# Patient Record
Sex: Female | Born: 1935
Health system: Southern US, Community
[De-identification: ages and names within clinical notes are randomized; demographics above are authoritative.]

## PROBLEM LIST (undated history)

## (undated) DIAGNOSIS — Q632 Ectopic kidney: Secondary | ICD-10-CM

## (undated) DIAGNOSIS — D801 Nonfamilial hypogammaglobulinemia: Secondary | ICD-10-CM

## (undated) DIAGNOSIS — I341 Nonrheumatic mitral (valve) prolapse: Secondary | ICD-10-CM

## (undated) DIAGNOSIS — D510 Vitamin B12 deficiency anemia due to intrinsic factor deficiency: Secondary | ICD-10-CM

## (undated) DIAGNOSIS — M5430 Sciatica, unspecified side: Secondary | ICD-10-CM

## (undated) DIAGNOSIS — F40240 Claustrophobia: Secondary | ICD-10-CM

## (undated) DIAGNOSIS — M1990 Unspecified osteoarthritis, unspecified site: Secondary | ICD-10-CM

## (undated) DIAGNOSIS — E785 Hyperlipidemia, unspecified: Secondary | ICD-10-CM

## (undated) DIAGNOSIS — G459 Transient cerebral ischemic attack, unspecified: Secondary | ICD-10-CM

## (undated) DIAGNOSIS — K5730 Diverticulosis of large intestine without perforation or abscess without bleeding: Secondary | ICD-10-CM

## (undated) DIAGNOSIS — C4491 Basal cell carcinoma of skin, unspecified: Secondary | ICD-10-CM

## (undated) DIAGNOSIS — I209 Angina pectoris, unspecified: Secondary | ICD-10-CM

## (undated) DIAGNOSIS — I639 Cerebral infarction, unspecified: Secondary | ICD-10-CM

## (undated) DIAGNOSIS — K449 Diaphragmatic hernia without obstruction or gangrene: Secondary | ICD-10-CM

## (undated) DIAGNOSIS — I10 Essential (primary) hypertension: Secondary | ICD-10-CM

## (undated) HISTORY — PX: APPENDECTOMY: SHX54

## (undated) HISTORY — PX: BASAL CELL CARCINOMA EXCISION: SHX1214

## (undated) HISTORY — DX: Cerebral infarction, unspecified: I63.9

## (undated) HISTORY — PX: CHOLECYSTECTOMY OPEN: SUR202

## (undated) HISTORY — PX: INGUINAL HERNIA REPAIR: SUR1180

## (undated) HISTORY — PX: DILATION AND CURETTAGE OF UTERUS: SHX78

## (undated) HISTORY — PX: OTHER SURGICAL HISTORY: SHX169

## (undated) HISTORY — DX: Ectopic kidney: Q63.2

## (undated) HISTORY — PX: BREAST BIOPSY: SHX20

## (undated) HISTORY — DX: Transient cerebral ischemic attack, unspecified: G45.9

## (undated) HISTORY — PX: BREAST LUMPECTOMY: SHX2

## (undated) HISTORY — DX: Essential (primary) hypertension: I10

## (undated) HISTORY — DX: Basal cell carcinoma of skin, unspecified: C44.91

## (undated) HISTORY — DX: Angina pectoris, unspecified: I20.9

## (undated) HISTORY — DX: Unspecified osteoarthritis, unspecified site: M19.90

## (undated) HISTORY — DX: Claustrophobia: F40.240

## (undated) HISTORY — PX: TOTAL ABDOMINAL HYSTERECTOMY: SHX209

## (undated) HISTORY — DX: Diverticulosis of large intestine without perforation or abscess without bleeding: K57.30

## (undated) HISTORY — DX: Vitamin B12 deficiency anemia due to intrinsic factor deficiency: D51.0

## (undated) HISTORY — PX: CATARACT EXTRACTION W/ INTRAOCULAR LENS  IMPLANT, BILATERAL: SHX1307

## (undated) HISTORY — DX: Sciatica, unspecified side: M54.30

## (undated) HISTORY — DX: Nonfamilial hypogammaglobulinemia: D80.1

## (undated) HISTORY — DX: Diaphragmatic hernia without obstruction or gangrene: K44.9

## (undated) HISTORY — PX: TONSILLECTOMY: SUR1361

## (undated) HISTORY — DX: Nonrheumatic mitral (valve) prolapse: I34.1

## (undated) HISTORY — DX: Hyperlipidemia, unspecified: E78.5

---

## 2005-01-09 ENCOUNTER — Encounter (INDEPENDENT_AMBULATORY_CARE_PROVIDER_SITE_OTHER): Payer: Self-pay | Admitting: Specialist

## 2005-01-09 ENCOUNTER — Inpatient Hospital Stay (HOSPITAL_COMMUNITY): Admission: RE | Admit: 2005-01-09 | Discharge: 2005-01-12 | Payer: Self-pay | Admitting: Gynecology

## 2005-05-10 ENCOUNTER — Encounter: Payer: Self-pay | Admitting: Internal Medicine

## 2006-08-06 ENCOUNTER — Encounter: Payer: Self-pay | Admitting: Internal Medicine

## 2007-03-19 ENCOUNTER — Emergency Department: Payer: Self-pay | Admitting: Emergency Medicine

## 2007-09-06 ENCOUNTER — Emergency Department: Payer: Self-pay | Admitting: Emergency Medicine

## 2008-01-07 ENCOUNTER — Emergency Department: Payer: Self-pay | Admitting: Emergency Medicine

## 2008-11-02 ENCOUNTER — Encounter: Payer: Self-pay | Admitting: Internal Medicine

## 2008-11-02 ENCOUNTER — Ambulatory Visit: Payer: Self-pay | Admitting: Gastroenterology

## 2009-06-17 ENCOUNTER — Encounter: Payer: Self-pay | Admitting: Internal Medicine

## 2009-08-31 HISTORY — PX: KNEE ARTHROSCOPY W/ PARTIAL MEDIAL MENISCECTOMY: SHX1882

## 2009-09-03 ENCOUNTER — Encounter: Admission: RE | Admit: 2009-09-03 | Discharge: 2009-09-03 | Payer: Self-pay | Admitting: Orthopedic Surgery

## 2009-09-15 ENCOUNTER — Ambulatory Visit: Payer: Self-pay | Admitting: Orthopedic Surgery

## 2009-09-20 ENCOUNTER — Ambulatory Visit: Payer: Self-pay | Admitting: Orthopedic Surgery

## 2010-02-09 ENCOUNTER — Ambulatory Visit: Payer: Self-pay | Admitting: Cardiovascular Disease

## 2010-02-09 DIAGNOSIS — F39 Unspecified mood [affective] disorder: Secondary | ICD-10-CM | POA: Insufficient documentation

## 2010-02-09 DIAGNOSIS — I059 Rheumatic mitral valve disease, unspecified: Secondary | ICD-10-CM

## 2010-02-09 DIAGNOSIS — I10 Essential (primary) hypertension: Secondary | ICD-10-CM

## 2010-02-09 DIAGNOSIS — R079 Chest pain, unspecified: Secondary | ICD-10-CM

## 2010-02-09 DIAGNOSIS — Q401 Congenital hiatus hernia: Secondary | ICD-10-CM | POA: Insufficient documentation

## 2010-02-16 ENCOUNTER — Encounter: Payer: Self-pay | Admitting: Internal Medicine

## 2010-11-02 ENCOUNTER — Encounter: Payer: Self-pay | Admitting: Internal Medicine

## 2010-11-02 DIAGNOSIS — D510 Vitamin B12 deficiency anemia due to intrinsic factor deficiency: Secondary | ICD-10-CM | POA: Insufficient documentation

## 2010-11-02 DIAGNOSIS — E785 Hyperlipidemia, unspecified: Secondary | ICD-10-CM

## 2010-11-07 ENCOUNTER — Ambulatory Visit: Payer: Self-pay | Admitting: Internal Medicine

## 2010-11-07 ENCOUNTER — Telehealth: Payer: Self-pay | Admitting: Internal Medicine

## 2010-11-07 DIAGNOSIS — M159 Polyosteoarthritis, unspecified: Secondary | ICD-10-CM

## 2010-11-07 DIAGNOSIS — R197 Diarrhea, unspecified: Secondary | ICD-10-CM | POA: Insufficient documentation

## 2010-11-10 LAB — CONVERTED CEMR LAB
ALT: 27 units/L (ref 0–35)
AST: 27 units/L (ref 0–37)
Albumin: 4.1 g/dL (ref 3.5–5.2)
Basophils Relative: 0.5 % (ref 0.0–3.0)
Bilirubin, Direct: 0.2 mg/dL (ref 0.0–0.3)
Calcium: 9.7 mg/dL (ref 8.4–10.5)
Eosinophils Absolute: 0 10*3/uL (ref 0.0–0.7)
Glucose, Bld: 84 mg/dL (ref 70–99)
Lymphs Abs: 1.1 10*3/uL (ref 0.7–4.0)
MCHC: 34 g/dL (ref 30.0–36.0)
MCV: 96.2 fL (ref 78.0–100.0)
Monocytes Absolute: 0.7 10*3/uL (ref 0.1–1.0)
Neutro Abs: 4.7 10*3/uL (ref 1.4–7.7)
Neutrophils Relative %: 71.1 % (ref 43.0–77.0)
Potassium: 3.3 meq/L — ABNORMAL LOW (ref 3.5–5.1)
RBC: 4.11 M/uL (ref 3.87–5.11)

## 2010-11-21 ENCOUNTER — Ambulatory Visit: Payer: Self-pay | Admitting: Internal Medicine

## 2010-11-21 DIAGNOSIS — E876 Hypokalemia: Secondary | ICD-10-CM

## 2010-11-21 DIAGNOSIS — J019 Acute sinusitis, unspecified: Secondary | ICD-10-CM

## 2011-01-02 ENCOUNTER — Encounter: Payer: Self-pay | Admitting: Internal Medicine

## 2011-01-02 ENCOUNTER — Ambulatory Visit: Admission: RE | Admit: 2011-01-02 | Payer: Self-pay | Source: Home / Self Care | Admitting: Internal Medicine

## 2011-01-02 ENCOUNTER — Ambulatory Visit
Admission: RE | Admit: 2011-01-02 | Discharge: 2011-01-02 | Payer: Self-pay | Source: Home / Self Care | Attending: Family Medicine | Admitting: Family Medicine

## 2011-01-02 LAB — CONVERTED CEMR LAB
Ketones, urine, test strip: NEGATIVE
Nitrite: NEGATIVE
Urobilinogen, UA: 0.2

## 2011-01-03 ENCOUNTER — Encounter: Payer: Self-pay | Admitting: Family Medicine

## 2011-01-30 NOTE — Assessment & Plan Note (Signed)
Summary: NEW PT TO ESTABH MEDICARE   Vital Signs:  Patient profile:   77 year old female Height:      61.5 inches Weight:      153 pounds BMI:     28.54 Temp:     98.3 degrees F oral Pulse rate:   72 / minute Pulse rhythm:   regular BP sitting:   140 / 70  (left arm) Cuff size:   regular  Vitals Entered By: Mervin Hack CMA Duncan Dull) (November 07, 2010 12:06 PM) CC: new patient to establish care   History of Present Illness: Had wanted to get in here in past but we had limited access Sees Dr Mariah Milling for her heart issues Trouble with Dr Bethena Midget since he is not in the office all the time  Has history of angina had cath at [facility] years ago but had no sig blockages  had muscle aching and fatigue These resolved with stopping lipitor Still occ gets periods of arm heaviness--did find "muscle pill" didn't help but a glass of wine did  Has known "leaky valve" No palpitations Occ chest pain--does use nitrospray very occ  Has had uncontrolled diarrhea at times since gallbladder surgery uses the lomotil before eating out  No sig issues with HTN was given HCTZ with fluid retention but has been high at times  Allergies: 1)  ! * Blood Transfusion 2)  ! Sulfa 3)  ! Prednisone 4)  ! * Ivp Dye 5)  ! Epinephrine 6)  ! * Nitrofurantoin 7)  ! Ace Inhibitors 8)  ! Hydrocodone 9)  ! * Tamadol 10)  ! * Propoxyphen 11)  ! * Bee Stings  Past History:  Past Medical History: MItral valve prolapse and trucuspid  leak Angina Hiatus Hernia Border line Hypogammaglobulinemia Pelvic kidney (lower left side) Sciatica problems Claustrophia Diverticulosis, colon Hyperlipidemia Pernicious anemia Hypertension Osteoarthritis  Past Surgical History: Appendectomy Tonsillectomy Gallbladder removed Right ovary and tube corpus  D & C removal right lump  on breast (fatty tumor) Inguinal hernia repaired  left side Excision vestibular glands Basal cell carcinome - back Intraocular  lens implant - left eye Intraocular lens implant - right eye Film removed left eye Total hysterectomy Torn Menuscus repaired--right (9/10)  Family History: Father: deceased 78: MI, bypassed Mother: deceased 67; dementia, some heart problems Multiple maternal aunts and GM with breast cancer Mat uncles died of bladder and lung cancer Pat GM had DM some HTN--Dad and brother  Social History: Married --2 children Retired bookkeeping/secretarial work Tobacco Use - No.  Alcohol Use - regular wine in general Regular Exercise - no Drug Use - no  Has living will and advance directives. Requests husband, then daughter to make health care decisions. Would accept DNR but wouldn't want life prolonging technology if not cognitively aware  Review of Systems General:  weight fairly stable Watches grandchildren a lot---has cut into exercise schedule sleeps fairly well wears seat belt . Eyes:  Denies double vision and vision loss-1 eye. ENT:  Complains of decreased hearing; OWn teeth--regular with dentist. CV:  Complains of chest pain or discomfort and shortness of breath with exertion; denies difficulty breathing at night, difficulty breathing while lying down, fainting, lightheadness, and palpitations; some DOE with steps--stable. Resp:  Complains of cough; denies shortness of breath; has cough due to drainage. GI:  Complains of diarrhea; denies change in bowel habits, dark tarry stools, indigestion, nausea, and vomiting. GU:  Denies dysuria and incontinence; occ bladder pressure or infection Had IVPs  in  gets urgency but no incontinence. MS:  Complains of joint pain; denies joint swelling; degenerative disc disease and some knee problems has seen Dr Gavin Potters No sig pain. Derm:  Complains of lesion(s); denies rash; sees Dr Purcell Nails once a year for exam had lesion removed from back years ago. Neuro:  Complains of tingling; denies headaches and weakness; occ finger sensory changes in  fingers. Psych:  Complains of anxiety; denies depression; anxious about husband's upcoming surgery. Heme:  Denies enlarge lymph nodes; bruises easy--?related to dog jumping on her. Allergy:  Complains of seasonal allergies and sneezing; occ sinus drainage at night--benedryl at bedtime helps seems to have allergies since moving here from [place].  Physical Exam  General:  alert and normal appearance.     Impression & Recommendations:  Problem # 1:  HYPERTENSION (ICD-401.9) Assessment Comment Only  good control on for diuretic as well  Her updated medication list for this problem includes:    Hydrochlorothiazide 25 Mg Tabs (Hydrochlorothiazide) .Marland Kitchen... Take one tablet by mouth daily.  BP today: 140/70 Prior BP: 140/80 (02/09/2010)  Orders: TLB-Renal Function Panel (80069-RENAL) TLB-CBC Platelet - w/Differential (85025-CBCD) TLB-Hepatic/Liver Function Pnl (80076-HEPATIC) TLB-TSH (Thyroid Stimulating Hormone) (84443-TSH) Venipuncture (40347)  Problem # 2:  MITRAL VALVE PROLAPSE (ICD-424.0) Assessment: Comment Only no sig symptoms ?slight click but no audible regurgitation  The following medications were removed from the medication list:    Aspirin Ec 325 Mg Tbec (Aspirin) .Marland Kitchen... 2 by mouth once daily for knee pain  Problem # 3:  OSTEOARTHRITIS (ICD-715.90) Assessment: Comment Only  does okay without sig meds  The following medications were removed from the medication list:    Aspirin Ec 325 Mg Tbec (Aspirin) .Marland Kitchen... 2 by mouth once daily for knee pain  Problem # 4:  ANGINA PECTORIS (ICD-413.9) Assessment: Unchanged no CAD on cath may be noncardiac or small vessel disease does use NTG occ  The following medications were removed from the medication list:    Aspirin Ec 325 Mg Tbec (Aspirin) .Marland Kitchen... 2 by mouth once daily for knee pain Her updated medication list for this problem includes:    Nitrolingual 0.4 Mg/spray Soln (Nitroglycerin) ..... One spray under tongue every  5 minutes as needed for chest pain---may repeat times three    Hydrochlorothiazide 25 Mg Tabs (Hydrochlorothiazide) .Marland Kitchen... Take one tablet by mouth daily.  Problem # 5:  DIARRHEA, CHRONIC (ICD-787.91) Assessment: Comment Only does okay with the meds (colestipol regularly)  Her updated medication list for this problem includes:    Lomotil 2.5-0.025 Mg Tabs (Diphenoxylate-atropine) .Marland Kitchen... 1 tab before going out for a meal to prevent diarrhea  Complete Medication List: 1)  Lomotil 2.5-0.025 Mg Tabs (Diphenoxylate-atropine) .Marland Kitchen.. 1 tab before going out for a meal to prevent diarrhea 2)  Colestipol Hcl 1 Gm Tabs (Colestipol hcl) .... 2 by mouth two times a day before meals 3)  Nitrolingual 0.4 Mg/spray Soln (Nitroglycerin) .... One spray under tongue every 5 minutes as needed for chest pain---may repeat times three 4)  Hydrochlorothiazide 25 Mg Tabs (Hydrochlorothiazide) .... Take one tablet by mouth daily. 5)  Fish Oil 1000 Mg Caps (Omega-3 fatty acids) .Marland Kitchen.. 1 by mouth once daily 6)  Viactiv 500-500-40 Mg-unt-mcg Chew (Calcium-vitamin d-vitamin k) .Marland Kitchen.. 1 by mouth once daily 7)  Folic Acid 800 Mcg Tabs (Folic acid) .... Once daily 8)  Vitamin D3 1000 Unit Tabs (Cholecalciferol) .... Once daily 9)  Epi Pen  .... As directed  Patient Instructions: 1)  Please try loratadine 10mg  1-2 daily  or cetirizine 10mg  1 daily for allergies and sinus drainage 2)  Please schedule a follow-up appointment in 6 months .  Prescriptions: LOMOTIL 2.5-0.025 MG TABS (DIPHENOXYLATE-ATROPINE) 1 tab before going out for a meal to prevent diarrhea  #30 x 1   Entered by:   Mervin Hack CMA (AAMA)   Authorized by:   Cindee Salt MD   Signed by:   Cindee Salt MD on 11/07/2010   Method used:   [facility]phoned to ...       [facility]  [address] [id number]       [address]       [place], [place]  [address]       Ph: [phone]       Fax: [phone]   RxID:   [id number] NITROLINGUAL 0.4 MG/SPRAY  SOLN (NITROGLYCERIN) One spray under tongue every 5 minutes as needed for chest pain---may repeat times three  #1 x 1   Entered by:   Mervin Hack CMA (AAMA)   Authorized by:   Cindee Salt MD   Signed by:   Mervin Hack CMA (AAMA) on 11/07/2010   Method used:   [facility]ronically to        [id number]       [address]       [place], [place]  [address]       Ph: [phone]       Fax: [phone]   RxID:   [id number] HYDROCHLOROTHIAZIDE 25 MG TABS (HYDROCHLOROTHIAZIDE) Take one tablet by mouth daily.  #30 x 11   Entered by:   Mervin Hack CMA (AAMA)   Authorized by:   Cindee Salt MD   Signed by:   Mervin Hack CMA (AAMA) on 11/07/2010   Method used:   [facility]ically to        [id number]       [address]       [place], [place]  [address]       Ph: [phone]       Fax: [phone]   RxID:   [id number] COLESTIPOL HCL 1 GM TABS (COLESTIPOL HCL) 2 by mouth two times a day before meals  #120 x 1   Entered by:   Mervin Hack CMA (AAMA)   Authorized by:   Cindee Salt MD   Signed by:   Mervin Hack CMA (AAMA) on 11/07/2010   Method used:   [facility] to        [id number]       [address]       [place], [place]  [address]       Ph: [phone]       Fax: [phone]   RxID:   [id number]    Orders Added: 1)  TLB-Renal Function Panel [80069-RENAL] 2)  TLB-CBC Platelet - w/Differential [85025-CBCD] 3)  TLB-Hepatic/Liver Function Pnl [80076-HEPATIC] 4)  TLB-TSH (Thyroid Stimulating Hormone) [84443-TSH] 5)  Venipuncture [39767] 6)  New Patient Level IV [34193]   Immunization History:  Tetanus/Td Immunization History:    Tetanus/Td:  Historical (01/01/2004)  Influenza Immunization History:    Influenza:  Historical (10/04/2010)  Pneumovax Immunization History:    Pneumovax:  Historical (12/31/2000)   Immunization  History:  Influenza Immunization History:    Influenza:  Historical (10/04/2010)  Tetanus/Td Immunization History:    Tetanus/Td:  Historical (01/01/2004)  Pneumovax Immunization History:    Pneumovax:  Historical (12/31/2000)  Current Allergies (reviewed  today): ! * BLOOD TRANSFUSION ! SULFA ! PREDNISONE ! * IVP DYE ! EPINEPHRINE ! * NITROFURANTOIN ! ACE INHIBITORS ! HYDROCODONE ! * TAMADOL ! * PROPOXYPHEN ! * BEE STINGS      Colonoscopy  Procedure date:  11/02/2008  Findings:      Hyperplastic polyp Diverticulosis Dr Servando Snare  Pap Smear  Procedure date:  02/16/2010  Findings:      Interpretation/Result:Negative for intraepithelial Lesion or Malignancy.     Mammogram  Procedure date:  06/15/2009  Findings:      No specific mammographic evidence of malignancy.    Appended Document: NEW PT TO ESTABH MEDICARE     Allergies: 1)  ! * Blood Transfusion 2)  ! Sulfa 3)  ! Prednisone 4)  ! * Ivp Dye 5)  ! Epinephrine 6)  ! * Nitrofurantoin 7)  ! Ace Inhibitors 8)  ! Hydrocodone 9)  ! * Tamadol 10)  ! * Propoxyphen 11)  ! * Bee Stings  Physical Exam  Eyes:  pupils equal, pupils round, and pupils reactive to light.   Mouth:  no erythema, no exudates, and no lesions.   Neck:  supple, no masses, no thyromegaly, no carotid bruits, and no cervical lymphadenopathy.   Lungs:  normal respiratory effort, no intercostal retractions, no accessory muscle use, and normal breath sounds.   Heart:  normal rate, regular rhythm, and no gallop.   ?slight systolic click---no clear audible murmurs though Abdomen:  soft and non-tender.   Msk:  no joint tenderness and no joint swelling.   Pulses:  1+ in feet Extremities:  No edema Neurologic:  alert & oriented X3, strength normal in all extremities, and gait normal.   Skin:  no suspicious lesions and no ulcerations.   Axillary Nodes:  No palpable lymphadenopathy Psych:  normally interactive, good eye contact, not  anxious appearing, and not depressed appearing.     Complete Medication List: 1)  Lomotil 2.5-0.025 Mg Tabs (Diphenoxylate-atropine) .Marland Kitchen.. 1 tab before going out for a meal to prevent diarrhea 2)  Colestipol Hcl 1 Gm Tabs (Colestipol hcl) .... 2 by mouth two times a day before meals 3)  Nitrolingual 0.4 Mg/spray Soln (Nitroglycerin) .... One spray under tongue every 5 minutes as needed for chest pain---may repeat times three 4)  Hydrochlorothiazide 25 Mg Tabs (Hydrochlorothiazide) .... Take one tablet by mouth daily. 5)  Fish Oil 1000 Mg Caps (Omega-3 fatty acids) .Marland Kitchen.. 1 by mouth once daily 6)  Viactiv 500-500-40 Mg-unt-mcg Chew (Calcium-vitamin d-vitamin k) .Marland Kitchen.. 1 by mouth once daily 7)  Folic Acid 800 Mcg Tabs (Folic acid) .... Once daily 8)  Vitamin D3 1000 Unit Tabs (Cholecalciferol) .... Once daily 9)  Epi Pen  .... As directed

## 2011-01-30 NOTE — Progress Notes (Signed)
Summary: Td and pneumonia   Phone Note Call from Patient   Caller: Patient Call For: Cindee Salt MD Summary of Call: Patient called to let you know that she received her last Td on 01-07-2008 and her pneumonia shot on 09-05-2004.  Initial call taken by: Melody Comas,  November 07, 2010 1:17 PM  Follow-up for Phone Call        I will correct the dates Follow-up by: Cindee Salt MD,  November 07, 2010 1:33 PM      Immunization History:  Tetanus/Td Immunization History:    Tetanus/Td:  Historical (01/07/2008)  Pneumovax Immunization History:    Pneumovax:  Historical (09/05/2004)

## 2011-01-30 NOTE — Letter (Signed)
Summary: Patient Questionnaire  Patient Questionnaire   Imported By: Beau Fanny 11/08/2010 13:48:54  _____________________________________________________________________  External Attachment:    Type:   Image     Comment:   External Document

## 2011-01-30 NOTE — Assessment & Plan Note (Signed)
Summary: ? SINUS INFECTION   Vital Signs:  Patient profile:   77 year old female Weight:      151 pounds Temp:     98.8 degrees F oral BP sitting:   130 / 80  (left arm) Cuff size:   regular  Vitals Entered By: Mervin Hack CMA Duncan Dull) (November 21, 2010 12:37 PM) CC: sinus infection   History of Present Illness: Husband had rotator cuff surgery on the 10th exposed to coughing person in watiing room  started with sore throat and headache about 10 days ago tried mucinex with loosened mucus --lots of yellow now voice is off  No fever No SOB some cough--mostly in AM. More mucus in AM, dry and hacky at other times Did have left earache at first---better with the mucinex   Allergies: 1)  ! * Blood Transfusion 2)  ! Sulfa 3)  ! Prednisone 4)  ! * Ivp Dye 5)  ! Epinephrine 6)  ! * Nitrofurantoin 7)  ! Ace Inhibitors 8)  ! Hydrocodone 9)  ! * Tamadol 10)  ! * Propoxyphen 11)  ! * Bee Stings  Past History:  Past medical, surgical, family and social histories (including risk factors) reviewed for relevance to current acute and chronic problems.  Past Medical History: Reviewed history from 11/07/2010 and no changes required. MItral valve prolapse and trucuspid  leak Angina Hiatus Hernia Border line Hypogammaglobulinemia Pelvic kidney (lower left side) Sciatica problems Claustrophia Diverticulosis, colon Hyperlipidemia Pernicious anemia Hypertension Osteoarthritis  Past Surgical History: Reviewed history from 11/07/2010 and no changes required. Appendectomy Tonsillectomy Gallbladder removed Right ovary and tube corpus  D & C removal right lump  on breast (fatty tumor) Inguinal hernia repaired  left side Excision vestibular glands Basal cell carcinome - back Intraocular lens implant - left eye Intraocular lens implant - right eye Film removed left eye Total hysterectomy Torn Menuscus repaired--right (9/10)  Family History: Reviewed history from  11/07/2010 and no changes required. Father: deceased 47: MI, bypassed Mother: deceased 53; dementia, some heart problems Multiple maternal aunts and GM with breast cancer Mat uncles died of bladder and lung cancer Pat GM had DM some HTN--Dad and brother  Social History: Reviewed history from 11/07/2010 and no changes required. Married --2 children Retired bookkeeping/secretarial work Tobacco Use - No.  Alcohol Use - regular wine in general Regular Exercise - no Drug Use - no  Has living will and advance directives. Requests husband, then daughter to make health care decisions. Would accept DNR but wouldn't want life prolonging technology if not cognitively aware  Review of Systems       No vomiting or diarrhea appetite is okay  Physical Exam  General:  alert.  NAD Head:  no frontal or maxillary tenderness Ears:  R ear normal and L ear normal.   Nose:  moderate inflammation bilat Mouth:  no erythema and no exudates.   Neck:  supple, no masses, and no cervical lymphadenopathy.   Lungs:  normal respiratory effort, no intercostal retractions, no accessory muscle use, normal breath sounds, no crackles, and no wheezes.     Impression & Recommendations:  Problem # 1:  SINUSITIS - ACUTE-NOS (ICD-461.9) Assessment New  seems to have bacterial infection will treat with amoxil OTC analgesics  Her updated medication list for this problem includes:    Amoxicillin 500 Mg Tabs (Amoxicillin) .Marland Kitchen... 2 tabs by mouth two times a day for sinus infection  Complete Medication List: 1)  Lomotil 2.5-0.025 Mg Tabs (Diphenoxylate-atropine) .Marland KitchenMarland KitchenMarland Kitchen  1 tab before going out for a meal to prevent diarrhea 2)  Colestipol Hcl 1 Gm Tabs (Colestipol hcl) .... 2 by mouth two times a day before meals 3)  Nitrolingual 0.4 Mg/spray Soln (Nitroglycerin) .... One spray under tongue every 5 minutes as needed for chest pain---may repeat times three 4)  Hydrochlorothiazide 25 Mg Tabs (Hydrochlorothiazide) ....  Take one tablet by mouth daily. 5)  Fish Oil 1000 Mg Caps (Omega-3 fatty acids) .Marland Kitchen.. 1 by mouth once daily 6)  Viactiv 500-500-40 Mg-unt-mcg Chew (Calcium-vitamin d-vitamin k) .Marland Kitchen.. 1 by mouth once daily 7)  Folic Acid 800 Mcg Tabs (Folic acid) .... Once daily 8)  Vitamin D3 1000 Unit Tabs (Cholecalciferol) .... Once daily 9)  Epi Pen  .... As directed 10)  Amoxicillin 500 Mg Tabs (Amoxicillin) .... 2 tabs by mouth two times a day for sinus infection  Other Orders: Venipuncture (16109) TLB-Potassium (K+) (84132-K) Vit B12 1000 mcg (J3420) Admin of Therapeutic Inj  intramuscular or subcutaneous (60454) TLB-B12, Serum-Total ONLY (09811-B14)  Patient Instructions: 1)  Please keep regular appt 2)  Call next week if the sinus infection is not better Prescriptions: AMOXICILLIN 500 MG TABS (AMOXICILLIN) 2 tabs by mouth two times a day for sinus infection  #40 x 0   Entered and Authorized by:   Cindee Salt MD   Signed by:   Cindee Salt MD on 11/21/2010   Method used:   [facility]        [id number]       [address]       [place], [place]  [address]       Ph: [phone]       Fax: [phone]   RxID:   [id number]    Medication Administration  Injection # 1:    Medication: Vit B12 1000 mcg    Diagnosis: PERNICIOUS ANEMIA (ICD-281.0)    Route: IM    Site: R deltoid    Exp Date: 06/30/2012    Lot #: [id number]    Mfr: [employer]    Patient tolerated injection without complications    Given by: Mervin Hack CMA Duncan Dull) (November 21, 2010 1:02 PM)  Orders Added: 1)  Est. Patient Level III [38756] 2)  Venipuncture [43329] 3)  TLB-Potassium (K+) [84132-K] 4)  Vit B12 1000 mcg [J3420] 5)  Admin of Therapeutic Inj  intramuscular or subcutaneous [96372] 6)  TLB-B12, Serum-Total ONLY [82607-B12]    Current Allergies (reviewed today): ! * BLOOD TRANSFUSION ! SULFA ! PREDNISONE ! * IVP DYE ! EPINEPHRINE ! *  NITROFURANTOIN ! ACE INHIBITORS ! HYDROCODONE ! * TAMADOL ! * PROPOXYPHEN ! * BEE STINGS   Medication Administration  Injection # 1:    Medication: Vit B12 1000 mcg    Diagnosis: PERNICIOUS ANEMIA (ICD-281.0)    Route: IM    Site: R deltoid    Exp Date: 06/30/2012    Lot #: [id number]    Mfr: [employer]    Patient tolerated injection without complications    Given by: Mervin Hack CMA Duncan Dull) (November 21, 2010 1:02 PM)  Orders Added: 1)  Est. Patient Level III [60630] 2)  Venipuncture [16010] 3)  TLB-Potassium (K+) [84132-K] 4)  Vit B12 1000 mcg [J3420] 5)  Admin of Therapeutic Inj  intramuscular or subcutaneous [96372] 6)  TLB-B12, Serum-Total ONLY [93235-T73]

## 2011-01-30 NOTE — Letter (Signed)
Summary: [facility] Records  [facility] Records   Imported By: Beau Fanny 11/02/2010 16:41:48  _____________________________________________________________________  External Attachment:    Type:   Image     Comment:   External Document

## 2011-01-30 NOTE — Progress Notes (Signed)
Summary: Lens Implant ID Card & Hearing Instrument Card  Brought by [facility] Implant ID Card & Hearing Instrument Card  Brought by Patient   Imported By: Lanelle Bal 11/15/2010 14:10:51  _____________________________________________________________________  External Attachment:    Type:   Image     Comment:   External Document

## 2011-01-30 NOTE — Letter (Signed)
Summary: Letter with Mammogram Results/[facility]  Letter with Mammogram Results/[facility]   Imported By: Lanelle Bal 11/15/2010 14:08:30  _____________________________________________________________________  External Attachment:    Type:   Image     Comment:   External Document

## 2011-01-30 NOTE — Miscellaneous (Signed)
  Clinical Lists Changes  Problems: Added new problem of DIVERTICULOSIS, COLON (ICD-562.10) Added new problem of HYPERLIPIDEMIA (ICD-272.4) Added new problem of PERNICIOUS ANEMIA (ICD-281.0) Observations: Added new observation of PAST SURG HX: Appendectomy Tonsillectomy Gallbladder removed Right ovary and tube corpus  D/C removal right lump  on breast (fatty tumor) Inguinal hernia repaired  left side Excision vestibular glands Basal cell carcinome - back Intraocular lens implant - left eye Intraocular lens implant - right eye Film removed left eye Total hysterectomy Torn Menuscus repaired--right (9/10) (11/02/2010 13:44) Added new observation of PAST MED HX: MItral valve prolapse and trucuspid  leak Angina Hiatus Hernia Border line Hypogammaglobulinemia Pelvic kidney (lower left side) Sciatica problems Claustrophia Diverticulosis, colon Hyperlipidemia Pernicious anemia (11/02/2010 13:44) Added new observation of HYPRLIPIDMIA: yes (11/02/2010 13:44) Added new observation of DVTICLOSISHX: yes (11/02/2010 13:44) Added new observation of MAMMOGRAM: No specific mammographic evidence of malignancy.  Assessment: BIRADS 1.  (06/15/2009 13:49) Added new observation of BONE DENSITY: Lumbar Spine:  T Score > -1.0 Spine.  Hip Total: T Score -2.5 to -1.0 Hip.    Osteopenia only in femoral neck High in spine--may be artifactual  No sig change from 5/06 (05/28/2008 13:50) Added new observation of PAP SMEAR: Interpretation/Result:Negative for intraepithelial Lesion or Malignancy.    (08/06/2006 13:49) Added new observation of ECHOINTERP: Slight global hypokinesis---EF 45% Mild biatrial enlargement Moderate mitral and tricuspid regurgitation (11/09/2005 13:47)      Past History:  Past Medical History: MItral valve prolapse and trucuspid  leak Angina Hiatus Hernia Border line Hypogammaglobulinemia Pelvic kidney (lower left side) Sciatica  problems Claustrophia Diverticulosis, colon Hyperlipidemia Pernicious anemia  Past Surgical History: Appendectomy Tonsillectomy Gallbladder removed Right ovary and tube corpus  D/C removal right lump  on breast (fatty tumor) Inguinal hernia repaired  left side Excision vestibular glands Basal cell carcinome - back Intraocular lens implant - left eye Intraocular lens implant - right eye Film removed left eye Total hysterectomy Torn Menuscus repaired--right (9/10)    Echocardiogram  Procedure date:  11/09/2005  Findings:      Slight global hypokinesis---EF 45% Mild biatrial enlargement Moderate mitral and tricuspid regurgitation  Pap Smear  Procedure date:  08/06/2006  Findings:      Interpretation/Result:Negative for intraepithelial Lesion or Malignancy.     Mammogram  Procedure date:  06/15/2009  Findings:      No specific mammographic evidence of malignancy.  Assessment: BIRADS 1.   Bone Density  Procedure date:  05/28/2008  Findings:      Lumbar Spine:  T Score > -1.0 Spine.  Hip Total: T Score -2.5 to -1.0 Hip.    Osteopenia only in femoral neck High in spine--may be artifactual  No sig change from 5/06

## 2011-01-30 NOTE — Assessment & Plan Note (Signed)
Summary: np6   Visit Type:  New Patient Primary Provider:  Mayer Masker, MD  CC:  having lots of chest pains lately and heavy arms -  last 2 weeks but mainly since sunday, mainly feeling in her arms, and back and neck. NTG has relieved the pain. Episodes happen it is hard to talk. Some sob with exertion. Always edema in ankles and feet. Mallory Rodriguez  History of Present Illness: 77 year old woman, patient of Dr. Bethena Midget, with past medical history of chest tightness, anxiety, mitral regurgitation, possible mitral valve prolapse, who presents to reestablish care.  Patient states that she has had one to 2 weeks of her arms feeling heavy, tingling radiating down both arms. She has a sensation at times without warning. Yesterday she states that it felt very heavy in her arms and the back of her shoulders. She wonders whether it may have been due to some trauma from her dog pulling on the leash. Typically comes on at rest and not with exertion and sometimes after she has been doing some very light housework. She has had some mild stresses in her life but nothing significant. She has not been very active due to the cold weather. She denies any significant symptoms of diaphoresis, chest pain, lightheadedness or dizziness. Otherwise she has been able to be active with no symptoms.  Preventive Screening-Counseling & Management  Alcohol-Tobacco     Alcohol drinks/day: 1     Alcohol type: wine     Smoking Status: never  Caffeine-Diet-Exercise     Caffeine use/day: 2-3 cups      Does Patient Exercise: no      Drug Use:  no.    Current Problems (verified): 1)  Congenital Hiatus Hernia  (ICD-750.6) 2)  Angina Pectoris  (ICD-413.9) 3)  Mitral Valve Prolapse  (ICD-424.0)  Current Medications (verified): 1)  Lomotil 2.5-0.025 Mg Tabs (Diphenoxylate-Atropine) .... As Directed 2)  Colestipol Hcl 1 Gm Tabs (Colestipol Hcl) .... 2 By Mouth Two Times A Day Before Meals 3)  Nitrolingual 0.4 Mg/spray Soln (Nitroglycerin)  .... One Spray Under Tongue Every 5 Minutes As Needed For Chest Pain---May Repeat Times Three 4)  Hydrochlorothiazide 25 Mg Tabs (Hydrochlorothiazide) .... Take One Tablet By Mouth Daily. 5)  Aspirin Ec 325 Mg Tbec (Aspirin) .... 2 By Mouth Once Daily For Knee Pain 6)  Fish Oil 1000 Mg Caps (Omega-3 Fatty Acids) .Mallory Rodriguez.. 1 By Mouth Once Daily 7)  Centrum Silver Ultra Womens  Tabs (Multiple Vitamins-Minerals) .Mallory Rodriguez.. 1 By Mouth Once Daily 8)  Viactiv 500-500-40 Mg-Unt-Mcg Chew (Calcium-Vitamin D-Vitamin K) .Mallory Rodriguez.. 1 By Mouth Once Daily 9)  Epi Pen .... As Directed 10)  Cobal-1000 1000 Mcg/ml Soln (Cyanocobalamin) .Mallory Rodriguez.. 1 By Mouth Monthly  Allergies (verified): 1)  ! * Blood Transfusion 2)  ! Sulfa 3)  ! Prednisone 4)  ! * Ivp Dye 5)  ! Epinephrine 6)  ! * Nitrofurantoin 7)  ! Ace Inhibitors 8)  ! Hydrocodone 9)  ! * Tamadol 10)  ! * Propoxyphen 11)  ! * Bee Stings  Past History:  Past Medical History: MItral valve prolapse and trucuspid  leak Angina Hiatus Hernia Border line Hypogammaglobulinemia Pelvic kidney (lower left side) Sciatica problems Claustrophia  Past Surgical History: Appendectomy Tonsillectomy Gallbladder removed Right ovary and tube corpus  D/C removal right lump  on breast (fatty tumor) Inguinal hernia repaired  left side Excision vestibular glands Basal cell carcinome - back Intraocular lens implant - left eye Intraocular lens implant - right eye Film removed  left eye Total hysterectomy Torn Menuscus repaired  Family History: Father: deceased 25: MI, bypassed Mother: deceased 70; dementia, some heart problems hx of cancer in family, and some heart disease  Social History: Married  Tobacco Use - No.  Alcohol Use - yes Regular Exercise - no Drug Use - no Alcohol drinks/day:  1 Smoking Status:  never Caffeine use/day:  2-3 cups  Does Patient Exercise:  no Drug Use:  no  Review of Systems  The patient denies anorexia, fever, weight loss, weight  gain, vision loss, decreased hearing, hoarseness, chest pain, syncope, dyspnea on exertion, peripheral edema, prolonged cough, headaches, hemoptysis, abdominal pain, melena, hematochezia, severe indigestion/heartburn, hematuria, incontinence, genital sores, muscle weakness, suspicious skin lesions, transient blindness, difficulty walking, depression, unusual weight change, abnormal bleeding, enlarged lymph nodes, angioedema, breast masses, and testicular masses.         Arm heaviness, neck and shoulder discomfort.   Vital Signs:  Patient profile:   77 year old female Height:      62 inches Weight:      159.50 pounds BMI:     29.28 Pulse rate:   59 / minute Pulse rhythm:   regular BP sitting:   140 / 80  (left arm) Cuff size:   regular  Vitals Entered By: Mercer Pod (February 09, 2010 10:06 AM)   Echocardiogram  Procedure date:  09/10/2008  Findings:      1. The left ventricle is normal in size wiht normal systolic function. Estimated ejection fraction is greater than 55%. There is mild concentric LVH. There is eveidence of diastolic relaxation abnormality based on mitral valve Doppler inflow patterns. 2. Right ventricle is normal in size with normal systolic function. 3. The left and right atria  are mildly dilated.  4. There is mild mitral, tricuspid, and aortic insuffciency with trace pulmonic valve insufficiency. 5. Right ventricular systolic pressure is elevated consistent with mild pulmonary hypertension.   Compared to prior study done at outside facility dated November 09, 2005, the ejection fraction has improved from 45% to greater than 55%, and the mitral and tricuspid regurgitation has improved from moderate to mild.   Impression & Recommendations:  Problem # 1:  CHEST PAIN-UNSPECIFIED (ICD-786.50) Ms. Baltz has atypical type chest discomfort predominantly with arm heaviness, posterior neck and shoulder discomfort. It does not seem associated with exertion and less  likely to be cardiac in nature. I suspect that she has some muscular ligamental strains and I suggested that she use hot packs, nonsteroidal anti-inflammatories. I have given her a prescription for some Flexeril to try for her symptoms when they get severe. Her purse weighs a tremendous amount of asked her to be careful carrying this around.   I have asked her to start an aspirin 81 mg daily, particularly on days that she does not take an aspirin for her knee pain. Her updated medication list for this problem includes:    Nitrolingual 0.4 Mg/spray Soln (Nitroglycerin) ..... One spray under tongue every 5 minutes as needed for chest pain---may repeat times three    Aspirin Ec 325 Mg Tbec (Aspirin) .Mallory Rodriguez... 2 by mouth once daily for knee pain  Problem # 2:  GENERALIZED ANXIETY DISORDER (ICD-300.02) Ms. Montville does have a history of anxiety but this seemed to well controlled on today's visit.  Problem # 3:  HYPERTENSION, BENIGN (ICD-401.1) blood pressure controlled on HCTZ. She's been on this medication for several years with no problems.  Will try to obtain her most recent lipid  panel from her primary care physician, Dr. Bethena Midget. Her updated medication list for this problem includes:    Hydrochlorothiazide 25 Mg Tabs (Hydrochlorothiazide) .Mallory Rodriguez... Take one tablet by mouth daily.    Aspirin Ec 325 Mg Tbec (Aspirin) .Mallory Rodriguez... 2 by mouth once daily for knee pain Prescriptions: FLEXERIL 10 MG TABS (CYCLOBENZAPRINE HCL) 1 tab by mouth daily as needed for muscle pain  #30 x 1   Entered by:   Charlena Cross, RN, BSN   Authorized by:   Dossie Arbour MD   Signed by:   Charlena Cross, RN, BSN on 02/09/2010   Method used:   Electronically to        [id number] ([facility]       [address]       [place], [place]  [address]       Ph: [phone]       Fax: [phone]   RxID:   [id number]

## 2011-01-30 NOTE — Progress Notes (Signed)
Summary: PHI  PHI   Imported By: Harlon Flor 02/14/2010 16:12:00  _____________________________________________________________________  External Attachment:    Type:   Image     Comment:   External Document

## 2011-01-30 NOTE — Procedures (Signed)
Summary: Colonoscopy/[facility]  [facility]   Imported By: Lanelle Bal 11/15/2010 14:06:53  _____________________________________________________________________  External Attachment:    Type:   Image     Comment:   External Document

## 2011-02-01 NOTE — Assessment & Plan Note (Signed)
Summary: POSSIBLE UTI OR BLADDER INFECTION???   Vital Signs:  Patient profile:   77 year old female Weight:      155.25 pounds Temp:     98.2 degrees F oral Pulse rate:   80 / minute Pulse rhythm:   regular BP sitting:   140 / 82  (left arm) Cuff size:   regular  Vitals Entered By: Selena Batten Dance CMA (AAMA) (January 02, 2011 8:33 AM) CC: ? UTI and sinus infection   History of Present Illness: CC: ?UTI and sinus issue  5-6 d h/o sinus pressure HA as well as lots of nasal discharge.  Tried nyquil (made fingers swell) and mucinex D.  + maxillary pressure as well as earache.  Getting thick yellow green mucous out.  + ST initially as well as swollen glands, now better.  At same time started with sxs of bladder infection.  tried sitz baths/baking soda as well as azo OTC.  + burning, polyuria, urgency.  doesn't void much.  + mild itching.  No fevers/chills, n/v/abd pain.  + suprapubic pressure.  No flank pain.  Today here for B12 shot as well.  Recently turned 77.  Current Medications (verified): 1)  Lomotil 2.5-0.025 Mg Tabs (Diphenoxylate-Atropine) .Marland Kitchen.. 1 Tab Before Going Out For A Meal To Prevent Diarrhea 2)  Colestipol Hcl 1 Gm Tabs (Colestipol Hcl) .... 2 By Mouth Two Times A Day Before Meals 3)  Nitrolingual 0.4 Mg/spray Soln (Nitroglycerin) .... One Spray Under Tongue Every 5 Minutes As Needed For Chest Pain---May Repeat Times Three 4)  Hydrochlorothiazide 25 Mg Tabs (Hydrochlorothiazide) .... Take One Tablet By Mouth Daily. 5)  Fish Oil 1000 Mg Caps (Omega-3 Fatty Acids) .Marland Kitchen.. 1 By Mouth Once Daily 6)  Viactiv 500-500-40 Mg-Unt-Mcg Chew (Calcium-Vitamin D-Vitamin K) .Marland Kitchen.. 1 By Mouth Once Daily 7)  Folic Acid 800 Mcg Tabs (Folic Acid) .... Once Daily 8)  Vitamin D3 1000 Unit Tabs (Cholecalciferol) .... Once Daily 9)  Epi Pen .... As Directed  Allergies: 1)  ! * Blood Transfusion 2)  ! Sulfa 3)  ! Prednisone 4)  ! * Ivp Dye 5)  ! Epinephrine 6)  ! * Nitrofurantoin 7)  ! Ace  Inhibitors 8)  ! Hydrocodone 9)  ! * Tamadol 10)  ! * Propoxyphen 11)  ! * Bee Stings  Past History:  Past Medical History: Last updated: 11/07/2010 MItral valve prolapse and trucuspid  leak Angina Hiatus Hernia Border line Hypogammaglobulinemia Pelvic kidney (lower left side) Sciatica problems Claustrophia Diverticulosis, colon Hyperlipidemia Pernicious anemia Hypertension Osteoarthritis  Social History: Last updated: 11/07/2010 Married --2 children Retired bookkeeping/secretarial work Tobacco Use - No.  Alcohol Use - regular wine in general Regular Exercise - no Drug Use - no  Has living will and advance directives. Requests husband, then daughter to make health care decisions. Would accept DNR but wouldn't want life prolonging technology if not cognitively aware  Review of Systems       per HPI  Physical Exam  General:  alert.  NAD Head:  no frontal or maxillary tenderness Eyes:  pupils equal, pupils round, and pupils reactive to light.   Ears:  R ear normal and L ear normal.   Nose:  dry, irritated bilaterally Mouth:  no erythema and no exudates.  + some clear PND Neck:  supple, no masses, and no cervical lymphadenopathy.   Lungs:  normal respiratory effort, no intercostal retractions, no accessory muscle use, normal breath sounds, no crackles, and no wheezes.   Heart:  normal rate, regular rhythm, and no gallop.   ?slight systolic click---no clear audible murmurs though Abdomen:  soft and non-tender.  + suprapubic pressure.  no CVA tenderness Pulses:  2+ rad pulses.  brisk cap refill Extremities:  No edema   Impression & Recommendations:  Problem # 1:  SINUSITIS - ACUTE-NOS (ICD-461.9) treat with cipro to cover UTI as well, x 10 days.  The following medications were removed from the medication list:    Amoxicillin 500 Mg Tabs (Amoxicillin) .Marland Kitchen... 2 tabs by mouth two times a day for sinus infection Her updated medication list for this problem  includes:    Ciprofloxacin Hcl 500 Mg Tabs (Ciprofloxacin hcl) .Marland Kitchen... Take one twice daily for 10 days  Problem # 2:  UTI (ICD-599.0) UA consistent with UTI.  treat with cipro.  UCx sent.  push fluids.  red flags to return discussed The following medications were removed from the medication list:    Amoxicillin 500 Mg Tabs (Amoxicillin) .Marland Kitchen... 2 tabs by mouth two times a day for sinus infection Her updated medication list for this problem includes:    Ciprofloxacin Hcl 500 Mg Tabs (Ciprofloxacin hcl) .Marland Kitchen... Take one twice daily for 10 days  Orders: UA Dipstick W/ Micro (manual) (16109) Specimen Handling (99000) T-Culture, Urine ([id number])  Complete Medication List: 1)  Lomotil 2.5-0.025 Mg Tabs (Diphenoxylate-atropine) .Marland Kitchen.. 1 tab before going out for a meal to prevent diarrhea 2)  Colestipol Hcl 1 Gm Tabs (Colestipol hcl) .... 2 by mouth two times a day before meals 3)  Nitrolingual 0.4 Mg/spray Soln (Nitroglycerin) .... One spray under tongue every 5 minutes as needed for chest pain---may repeat times three 4)  Hydrochlorothiazide 25 Mg Tabs (Hydrochlorothiazide) .... Take one tablet by mouth daily. 5)  Fish Oil 1000 Mg Caps (Omega-3 fatty acids) .Marland Kitchen.. 1 by mouth once daily 6)  Viactiv 500-500-40 Mg-unt-mcg Chew (Calcium-vitamin d-vitamin k) .Marland Kitchen.. 1 by mouth once daily 7)  Folic Acid 800 Mcg Tabs (Folic acid) .... Once daily 8)  Vitamin D3 1000 Unit Tabs (Cholecalciferol) .... Once daily 9)  Epi Pen  .... As directed 10)  Ciprofloxacin Hcl 500 Mg Tabs (Ciprofloxacin hcl) .... Take one twice daily for 10 days  Patient Instructions: 1)  Looks like urinary infection.  Treat with antibiotic x 7 days twice daily. 2)  Antibiotic should treat possible sinus infection.   3)  Take guaifenesin 400mg  IR 1 pills in am and at noon with plenty of fluid to help mobilize mucous (or simple mucinex). 4)  Afrin only 2-3 days.   5)  Use nasal saline spray or neti pot to help drainage of sinuses. 6)  If  you start having fevers >101.5, trouble swallowing or breathing, or are worsening instead of improving as expected, you may need to be seen again. 7)  Good to see you today, call clinic with questions.  Prescriptions: CIPROFLOXACIN HCL 500 MG TABS (CIPROFLOXACIN HCL) take one twice daily for 10 days  #20 x 0   Entered and Authorized by:   Eustaquio Boyden  MD   Signed by:   Eustaquio Boyden  MD on 01/02/2011   Method used:   Electronically to        [id number]       [address]       [place], [place]  [address]       Ph: [phone]       Fax: [phone]   RxID:   [id number]  Orders Added: 1)  Est. Patient Level III [16109] 2)  UA Dipstick W/ Micro (manual) [81000] 3)  Specimen Handling [99000] 4)  T-Culture, Urine [[id number]]    Current Allergies (reviewed today): ! * BLOOD TRANSFUSION ! SULFA ! PREDNISONE ! * IVP DYE ! EPINEPHRINE ! * NITROFURANTOIN ! ACE INHIBITORS ! HYDROCODONE ! * TAMADOL ! * PROPOXYPHEN ! * BEE STINGS  Laboratory Results   Urine Tests  Date/Time Received: January 02, 2011 8:44 AM  Date/Time Reported: January 02, 2011 8:44 AM   Routine Urinalysis   Color: yellow Appearance: Cloudy Glucose: negative   (Normal Range: Negative) Bilirubin: small   (Normal Range: Negative) Ketone: negative   (Normal Range: Negative) Spec. Gravity: 1.020   (Normal Range: 1.003-1.035) Blood: moderate   (Normal Range: Negative) pH: 6.0   (Normal Range: 5.0-8.0) Protein: trace   (Normal Range: Negative) Urobilinogen: 0.2   (Normal Range: 0-1) Nitrite: negative   (Normal Range: Negative) Leukocyte Esterace: large   (Normal Range: Negative)  Urine Microscopic WBC/HPF: TNTC RBC/HPF: TNTC Bacteria/HPF: 1+  Mucous/HPF: no Epithelial/HPF: rare Crystals/HPF: no Casts/LPF: no Yeast/HPF: no    Comments: read by ..............................Eustaquio Boyden  MD  January 02, 2011 9:01 AM UCx sent.

## 2011-02-01 NOTE — Assessment & Plan Note (Signed)
Summary: B-12/Dejon Jungman/JRR  Nurse Visit   Allergies: 1)  ! * Blood Transfusion 2)  ! Sulfa 3)  ! Prednisone 4)  ! * Ivp Dye 5)  ! Epinephrine 6)  ! * Nitrofurantoin 7)  ! Ace Inhibitors 8)  ! Hydrocodone 9)  ! * Tamadol 10)  ! * Propoxyphen 11)  ! * Bee Stings  Medication Administration  Injection # 1:    Medication: Vit B12 1000 mcg    Diagnosis: PERNICIOUS ANEMIA (ICD-281.0)    Route: IM    Site: L deltoid    Exp Date: 05/30/2012    Lot #: [id number]    Mfr: [employer]    Comments: Per Dr. Alphonsus Sias    Patient tolerated injection without complications    Given by: Selena Batten Dance CMA Duncan Dull) (January 02, 2011 8:54 AM)  Orders Added: 1)  Vit B12 1000 mcg [J3420] 2)  Admin of Therapeutic Inj  intramuscular or subcutaneous [13086]

## 2011-02-06 ENCOUNTER — Ambulatory Visit: Payer: Medicare Other

## 2011-02-06 ENCOUNTER — Encounter: Payer: Self-pay | Admitting: Internal Medicine

## 2011-02-06 DIAGNOSIS — D510 Vitamin B12 deficiency anemia due to intrinsic factor deficiency: Secondary | ICD-10-CM

## 2011-02-09 ENCOUNTER — Ambulatory Visit (INDEPENDENT_AMBULATORY_CARE_PROVIDER_SITE_OTHER): Payer: Medicare Other | Admitting: Cardiovascular Disease

## 2011-02-09 ENCOUNTER — Encounter: Payer: Self-pay | Admitting: Cardiovascular Disease

## 2011-02-09 DIAGNOSIS — E785 Hyperlipidemia, unspecified: Secondary | ICD-10-CM

## 2011-02-09 DIAGNOSIS — R5383 Other fatigue: Secondary | ICD-10-CM

## 2011-02-09 DIAGNOSIS — R5381 Other malaise: Secondary | ICD-10-CM

## 2011-02-09 DIAGNOSIS — I10 Essential (primary) hypertension: Secondary | ICD-10-CM

## 2011-02-15 NOTE — Assessment & Plan Note (Signed)
Summary: F/U 1 YEAR/SAB   Visit Type:  Follow-up Primary Provider:  Cindee Salt MD  CC:  Mallory Rodriguez. chest pain when anxious and feels fatigue more than usual and weight gain. Marland Kitchen  History of Present Illness: 77 year old woman with past medical history of chest tightness, anxiety, mitral regurgitation, possible mitral valve prolapse, who presents for routine followup  she reports that her weight has been increasing above what she would like. She has had a sinus infection and has done a course of antibiotics. She denies any chest pain, shortness of breath. She does have some fatigue.    She has had some mild stresses in her life but nothing significant. She has not been very active due to the cold weather. She denies any significant symptoms of diaphoresis, chest pain, lightheadedness or dizziness. Otherwise she has been able to be active with no symptoms.  EKG shows normal sinus rhythm with a rate of 65 beats per minute, no significant ST or T wave changes  Labs from October 2000 and showed total cholesterol 206, LDL 94, HDL 96  She had a cardiac catheterization many years ago at [facility] that showed no significant coronary artery disease  She has had significant problems before on Lipitor  Current Medications (verified): 1)  Lomotil 2.5-0.025 Mg Tabs (Diphenoxylate-Atropine) .Marland Kitchen.. 1 Tab Before Going Out For A Meal To Prevent Diarrhea 2)  Colestipol Hcl 1 Gm Tabs (Colestipol Hcl) .... 2 By Mouth Two Times A Day Before Meals 3)  Nitrolingual 0.4 Mg/spray Soln (Nitroglycerin) .... One Spray Under Tongue Every 5 Minutes As Needed For Chest Pain---May Repeat Times Three 4)  Hydrochlorothiazide 25 Mg Tabs (Hydrochlorothiazide) .... Take One Tablet By Mouth Daily. 5)  Fish Oil 1000 Mg Caps (Omega-3 Fatty Acids) .Marland Kitchen.. 1 By Mouth Once Daily 6)  Viactiv 500-500-40 Mg-Unt-Mcg Chew (Calcium-Vitamin D-Vitamin K) .Marland Kitchen.. 1 By Mouth Once Daily 7)  Folic Acid 800 Mcg Tabs (Folic Acid) .... Once  Daily 8)  Vitamin D3 1000 Unit Tabs (Cholecalciferol) .... Once Daily 9)  Epi Pen .... As Directed 10)  Ciprofloxacin Hcl 500 Mg Tabs (Ciprofloxacin Hcl) .... Take One Twice Daily For 10 Days 11)  Potassium Gluconate 550 Mg Tabs (Potassium Gluconate) .... One Tablet Once Daily  Allergies (verified): 1)  ! * Blood Transfusion 2)  ! Sulfa 3)  ! Prednisone 4)  ! * Ivp Dye 5)  ! Epinephrine 6)  ! * Nitrofurantoin 7)  ! Ace Inhibitors 8)  ! Hydrocodone 9)  ! * Tamadol 10)  ! * Propoxyphen 11)  ! * Bee Stings  Past History:  Past Medical History: Last updated: 2010-11-26 MItral valve prolapse and trucuspid  leak Angina Hiatus Hernia Border line Hypogammaglobulinemia Pelvic kidney (lower left side) Sciatica problems Claustrophia Diverticulosis, colon Hyperlipidemia Pernicious anemia Hypertension Osteoarthritis  Past Surgical History: Last updated: November 26, 2010 Appendectomy Tonsillectomy Gallbladder removed Right ovary and tube corpus  D & C removal right lump  on breast (fatty tumor) Inguinal hernia repaired  left side Excision vestibular glands Basal cell carcinome - back Intraocular lens implant - left eye Intraocular lens implant - right eye Film removed left eye Total hysterectomy Torn Menuscus repaired--right (9/10)  Family History: Last updated: 2010-11-26 Father: deceased 81: MI, bypassed Mother: deceased 37; dementia, some heart problems Multiple maternal aunts and GM with breast cancer Mat uncles died of bladder and lung cancer Pat GM had DM some HTN--Dad and brother  Social History: Last updated: Nov 26, 2010 Married --2 children Retired bookkeeping/secretarial work Tobacco Use -  No.  Alcohol Use - regular wine in general Regular Exercise - no Drug Use - no  Has living will and advance directives. Requests husband, then daughter to make health care decisions. Would accept DNR but wouldn't want life prolonging technology if not cognitively  aware  Risk Factors: Alcohol Use: 1 (02/09/2010) Caffeine Use: 2-3 cups  (02/09/2010) Exercise: no (02/09/2010)  Risk Factors: Smoking Status: never (02/09/2010)  Review of Systems  The patient denies fever, weight loss, weight gain, vision loss, decreased hearing, hoarseness, chest pain, syncope, dyspnea on exertion, peripheral edema, prolonged cough, abdominal pain, incontinence, muscle weakness, depression, and enlarged lymph nodes.         fatigue  Vital Signs:  Patient profile:   77 year old female Height:      61.5 inches Weight:      156 pounds BMI:     29.10 Pulse rate:   65 / minute BP sitting:   145 / 76  (left arm) Cuff size:   regular  Vitals Entered By: Bishop Dublin, CMA (February 09, 2011 10:18 AM)  Physical Exam  General:  Well developed, well nourished, in no acute distress. Head:  normocephalic and atraumatic Neck:  Neck supple, no JVD. No masses, thyromegaly or abnormal cervical nodes. Lungs:  Clear bilaterally to auscultation and percussion. Heart:  Non-displaced PMI, chest non-tender; regular rate and rhythm, S1, S2 without murmurs, rubs or gallops. Carotid upstroke normal, no bruit.  Pedals normal pulses. No edema, no varicosities. Abdomen:  Bowel sounds positive; abdomen soft and non-tender without masses Msk:  Back normal, normal gait. Muscle strength and tone normal. Pulses:  pulses normal in all 4 extremities Extremities:  No clubbing or cyanosis. Neurologic:  Alert and oriented x 3. Skin:  Intact without lesions or rashes. Psych:  Normal affect.   Impression & Recommendations:  Problem # 1:  HYPERTENSION (ICD-401.9) blood pressure is borderline elevated though she reports it has been well-controlled at home. No medication changes made at this time.  Her updated medication list for this problem includes:    Hydrochlorothiazide 25 Mg Tabs (Hydrochlorothiazide) .Marland Kitchen... Take one tablet by mouth daily.    Aspirin 81 Mg Tbec (Aspirin) .Marland Kitchen... Take  one tablet by mouth daily  Problem # 2:  HYPERLIPIDEMIA (ICD-272.4) We will try to obtain her most recent lipid panel for our records. We have suggested she use her colestipol b.i.d. Currently she is using it once a day.  We have suggested that she start aspirin 81 mg daily.  Her updated medication list for this problem includes:    Colestipol Hcl 1 Gm Tabs (Colestipol hcl) .Marland Kitchen... 2 by mouth two times a day before meals  Problem # 3:  FATIGUE / MALAISE (ICD-780.79) Etiology of her fatigue is uncertain. I suggested she try to increase her exercise. She is not doing much activity at all at baseline. Her last her to watch her weight as this could also be contributing to her symptoms. Improvement in her weight will also help her cholesterol.  Other Orders: EKG w/ Interpretation (93000)  Patient Instructions: 1)  Your physician recommends that you schedule a follow-up appointment in: 1 year 2)  Your physician has recommended you make the following change in your medication: START Aspirin 81mg  once daily.

## 2011-02-15 NOTE — Assessment & Plan Note (Signed)
Summary: B-12 injection  Nurse Visit   Allergies: 1)  ! * Blood Transfusion 2)  ! Sulfa 3)  ! Prednisone 4)  ! * Ivp Dye 5)  ! Epinephrine 6)  ! * Nitrofurantoin 7)  ! Ace Inhibitors 8)  ! Hydrocodone 9)  ! * Tamadol 10)  ! * Propoxyphen 11)  ! * Bee Stings  Medication Administration  Injection # 1:    Medication: Vit B12 1000 mcg    Diagnosis: PERNICIOUS ANEMIA (ICD-281.0)    Route: IM    Site: R deltoid    Exp Date: 09/30/2012    Lot #: [id number]    Mfr: American Regent    Patient tolerated injection without complications    Given by: Sydell Axon LPN (February 06, 2011 8:43 AM)  Orders Added: 1)  Vit B12 1000 mcg [J3420] 2)  Admin of Therapeutic Inj  intramuscular or subcutaneous [96372]   Medication Administration  Injection # 1:    Medication: Vit B12 1000 mcg    Diagnosis: PERNICIOUS ANEMIA (ICD-281.0)    Route: IM    Site: R deltoid    Exp Date: 09/30/2012    Lot #: [id number]    Mfr: American Regent    Patient tolerated injection without complications    Given by: Sydell Axon LPN (February 06, 2011 8:43 AM)  Orders Added: 1)  Vit B12 1000 mcg [J3420] 2)  Admin of Therapeutic Inj  intramuscular or subcutaneous [16109]

## 2011-02-22 ENCOUNTER — Ambulatory Visit (INDEPENDENT_AMBULATORY_CARE_PROVIDER_SITE_OTHER): Payer: Medicare Other | Admitting: Family Medicine

## 2011-02-22 ENCOUNTER — Encounter: Payer: Self-pay | Admitting: Family Medicine

## 2011-02-22 DIAGNOSIS — N390 Urinary tract infection, site not specified: Secondary | ICD-10-CM

## 2011-02-22 LAB — CONVERTED CEMR LAB
Glucose, Urine, Semiquant: NEGATIVE
Urobilinogen, UA: 0.2
pH: 6.5

## 2011-02-23 ENCOUNTER — Encounter: Payer: Self-pay | Admitting: Family Medicine

## 2011-02-26 ENCOUNTER — Encounter (INDEPENDENT_AMBULATORY_CARE_PROVIDER_SITE_OTHER): Payer: Self-pay | Admitting: *Deleted

## 2011-02-27 NOTE — Assessment & Plan Note (Signed)
Summary: ?UTI/CLE   MEDICARE/CARE FIRST   Vital Signs:  Patient profile:   77 year old female Height:      61.5 inches Weight:      156.50 pounds BMI:     29.20 Temp:     98.5 degrees F oral Pulse rate:   84 / minute Pulse rhythm:   regular BP sitting:   150 / 68  (left arm) Cuff size:   regular  Vitals Entered By: Delilah Shan CMA  Dull) (February 22, 2011 11:18 AM) CC: ? UTI   History of Present Illness: dysuria:yes duration of symptoms: 2 days abdominal pain: yes- suprapubic pressure fevers:no back pain:no vomiting:no other concerns:  some help with azo otc  Allergies: 1)  ! * Blood Transfusion 2)  ! Sulfa 3)  ! Prednisone 4)  ! * Ivp Dye 5)  ! Epinephrine 6)  ! * Nitrofurantoin 7)  ! Ace Inhibitors 8)  ! Hydrocodone 9)  ! * Tamadol 10)  ! * Propoxyphen 11)  ! * Bee Stings  Review of Systems       See HPI.  Otherwise negative.    Physical Exam  General:  GEN: nad, alert and oriented HEENT: mucous membranes moist NECK: supple CV: rrr.  PULM: ctab, no inc wob ABD: soft, +bs, suprapubic area tender EXT: no edema SKIN: no acute rash BACK: no CVA pain    Impression & Recommendations:  Problem # 1:  UTI (ICD-599.0) Start cipro and follow up as needed.  Check cx.  Nontoxic.  She understands.  The following medications were removed from the medication list:    Ciprofloxacin Hcl 500 Mg Tabs (Ciprofloxacin hcl) .Marland Kitchen... Take one twice daily for 10 days Her updated medication list for this problem includes:    Cipro 250 Mg Tabs (Ciprofloxacin hcl) .Marland Kitchen... 1 by mouth two times a day x3 days  Orders: Prescription Created Electronically [phone]) Specimen Handling ([id number]) T-Culture, Urine ([id number])  Complete Medication List: 1)  Lomotil 2.5-0.025 Mg Tabs (Diphenoxylate-atropine) .Marland Kitchen.. 1 tab before going out for a meal to prevent diarrhea 2)  Colestipol Hcl 1 Gm Tabs (Colestipol hcl) .... 2 by mouth two times a day before meals 3)  Nitrolingual 0.4 Mg/spray  Soln (Nitroglycerin) .... One spray under tongue every 5 minutes as needed for chest pain---may repeat times three 4)  Hydrochlorothiazide 25 Mg Tabs (Hydrochlorothiazide) .... Take one tablet by mouth daily. 5)  Fish Oil 1000 Mg Caps (Omega-3 fatty acids) .Marland Kitchen.. 1 by mouth once daily 6)  Viactiv 500-500-40 Mg-unt-mcg Chew (Calcium-vitamin d-vitamin k) .Marland Kitchen.. 1 by mouth once daily 7)  Folic Acid 800 Mcg Tabs (Folic acid) .... Once daily 8)  Vitamin D3 1000 Unit Tabs (Cholecalciferol) .... Once daily 9)  Epi Pen  .... As directed 10)  Potassium Gluconate 550 Mg Tabs (Potassium gluconate) .... One tablet once daily 11)  Aspirin 81 Mg Tbec (Aspirin) .... Take one tablet by mouth daily 12)  Cipro 250 Mg Tabs (Ciprofloxacin hcl) .Marland Kitchen.. 1 by mouth two times a day x3 days  Other Orders: UA Dipstick w/o Micro (manual) (24401)  Patient Instructions: 1)  Drink plenty of fluids. Cranberry juice is especially recommended in addition to large amounts of water. Avoid caffeine & carbonated drinks, they tend to irritate the bladder.  Let us know if you're not better: sooner if you're feeling worse.  We'll contact you with your lab report.  Start the antibiotics today and take the azo as needed.  Prescriptions: CIPRO 250 MG TABS (CIPROFLOXACIN  HCL) 1 by mouth two times a day x3 days  #6 x 0   Entered and Authorized by:   Crawford Givens MD   Signed by:   Crawford Givens MD on 02/22/2011   Method used:   Historical   RxID:   [id number]    Orders Added: 1)  UA Dipstick w/o Micro (manual) [81002] 2)  Prescription Created Electronically [G8553] 3)  Est. Patient Level III [13086] 4)  Specimen Handling [[id number]] 5)  T-Culture, Urine [[id number]]    Current Allergies (reviewed today): ! * BLOOD TRANSFUSION ! SULFA ! PREDNISONE ! * IVP DYE ! EPINEPHRINE ! * NITROFURANTOIN ! ACE INHIBITORS ! HYDROCODONE ! * TAMADOL ! * PROPOXYPHEN ! * BEE STINGS  Laboratory Results   Urine Tests  Date/Time  Received: February 22, 2011 11:27 AM   Routine Urinalysis   Color: yellow Appearance: Hazy Glucose: negative   (Normal Range: Negative) Bilirubin: small   (Normal Range: Negative) Ketone: negative   (Normal Range: Negative) Spec. Gravity: 1.020   (Normal Range: 1.003-1.035) Blood: Mod , non-hem   (Normal Range: Negative) pH: 6.5   (Normal Range: 5.0-8.0) Protein: trace   (Normal Range: Negative) Urobilinogen: 0.2   (Normal Range: 0-1) Nitrite: positive   (Normal Range: Negative) Leukocyte Esterace: moderate   (Normal Range: Negative)

## 2011-03-01 ENCOUNTER — Telehealth: Payer: Self-pay | Admitting: Family Medicine

## 2011-03-01 ENCOUNTER — Encounter: Payer: Self-pay | Admitting: Family Medicine

## 2011-03-02 ENCOUNTER — Encounter: Payer: Self-pay | Admitting: Family Medicine

## 2011-03-02 ENCOUNTER — Ambulatory Visit (INDEPENDENT_AMBULATORY_CARE_PROVIDER_SITE_OTHER): Payer: Medicare Other | Admitting: Family Medicine

## 2011-03-02 DIAGNOSIS — R21 Rash and other nonspecific skin eruption: Secondary | ICD-10-CM

## 2011-03-08 NOTE — Miscellaneous (Signed)
Summary: med list update- amox  Clinical Lists Changes  Medications: Added new medication of AMOXICILLIN 500 MG TABS (AMOXICILLIN) take one twice a day x 7 days     Prior Medications: LOMOTIL 2.5-0.025 MG TABS (DIPHENOXYLATE-ATROPINE) 1 tab before going out for a meal to prevent diarrhea COLESTIPOL HCL 1 GM TABS (COLESTIPOL HCL) 2 by mouth two times a day before meals NITROLINGUAL 0.4 MG/SPRAY SOLN (NITROGLYCERIN) One spray under tongue every 5 minutes as needed for chest pain---may repeat times three HYDROCHLOROTHIAZIDE 25 MG TABS (HYDROCHLOROTHIAZIDE) Take one tablet by mouth daily. FISH OIL 1000 MG CAPS (OMEGA-3 FATTY ACIDS) 1 by mouth once daily VIACTIV 500-500-40 MG-UNT-MCG CHEW (CALCIUM-VITAMIN D-VITAMIN K) 1 by mouth once daily FOLIC ACID 800 MCG TABS (FOLIC ACID) once daily VITAMIN D3 1000 UNIT TABS (CHOLECALCIFEROL) once daily EPI PEN () as directed POTASSIUM GLUCONATE 550 MG TABS (POTASSIUM GLUCONATE) one tablet once daily ASPIRIN 81 MG TBEC (ASPIRIN) Take one tablet by mouth daily CIPRO 250 MG TABS (CIPROFLOXACIN HCL) 1 by mouth two times a day x3 days Current Allergies: ! * BLOOD TRANSFUSION ! SULFA ! PREDNISONE ! * IVP DYE ! EPINEPHRINE ! * NITROFURANTOIN ! ACE INHIBITORS ! HYDROCODONE ! * TAMADOL ! * PROPOXYPHEN ! * BEE STINGS

## 2011-03-08 NOTE — Miscellaneous (Signed)
  Clinical Lists Changes  Allergies: Added new allergy or adverse reaction of AMOXICILLIN

## 2011-03-08 NOTE — Progress Notes (Signed)
Summary: diarrhea from amoxicillin  Phone Note Call from Patient   Caller: Patient Call For: Dr. Para March  Summary of Call: Patient says that she having terrible diarrhea from the amoxicillin. She is asking if she coudl try something else. [facility] [address]  Initial call taken by: Melody Comas,  March 01, 2011 8:46 AM  Follow-up for Phone Call        have her stop the amoxil until the diarrhea is better and then take keflex 500mg  by mouth two times a day for 5 days.  #10, no rf.  please send in.  thanks.  Follow-up by: Crawford Givens MD,  March 01, 2011 1:21 PM  Additional Follow-up for Phone Call Additional follow up Details #1::        Patient Advised.  Additional Follow-up by: Delilah Shan CMA (AAMA),  March 01, 2011 2:35 PM    New/Updated Medications: KEFLEX 500 MG CAPS (CEPHALEXIN) Take 1 capsule by mouth two times a day  for 5 days. Prescriptions: KEFLEX 500 MG CAPS (CEPHALEXIN) Take 1 capsule by mouth two times a day  for 5 days.  #10 x 0   Entered by:   Delilah Shan CMA (AAMA)   Authorized by:   Crawford Givens MD   Signed by:   Delilah Shan CMA (AAMA) on 03/01/2011   Method used:   Electronically to        [id number]       [address]       [place], [place]  [address]       Ph: [phone]       Fax: [phone]   RxID:   [id number]

## 2011-03-08 NOTE — Assessment & Plan Note (Signed)
Summary: RED BLOTCHES/CLE   MEDICARE,CARE FIRST   Vital Signs:  Patient profile:   77 year old female Height:      61.5 inches Weight:      156.50 pounds BMI:     29.20 Temp:     98.2 degrees F oral Pulse rate:   72 / minute Pulse rhythm:   regular BP sitting:   144 / 80  (left arm) Cuff size:   regular  Vitals Entered By: Delilah Shan CMA Duncan Dull) (March 02, 2011 11:05 AM) CC: Red blotches on back and chest   History of Present Illness: UTI with cx that directed amoxil use.  She got diarrhea on the amoxil but this isn't unusual for the patient ever since she had her gall bladder removed.  No blood in stool.  No fevers.  She had a transient ST that got better in the meantime; a cough has come up intermittently.  Some rhinorrhea.  This was since the last OV. She has continued the amoxil.  She hasn't started the keflex yet.  She saw some red blotches on her back and chest, but they don't itch.    Dysuria is better.    mult sick contacts.  No wheeze, lip/oral edema, no stridor.    Anticoagulation Management History:      Positive risk factors for bleeding include an age of 18 years or older.  The bleeding index is 'intermediate risk'.  Positive CHADS2 values include History of HTN and Age > 65 years old.     Allergies: 1)  ! * Blood Transfusion 2)  ! Sulfa 3)  ! Prednisone 4)  ! * Ivp Dye 5)  ! Epinephrine 6)  ! * Nitrofurantoin 7)  ! Ace Inhibitors 8)  ! Hydrocodone 9)  ! * Tamadol 10)  ! * Propoxyphen 11)  ! Amoxicillin 12)  ! * Bee Stings  Review of Systems       See HPI.  Otherwise negative.    Physical Exam  General:  no apparent distress normocephalic atraumatic tm wnl x2  mucous membranes moist, mild cobblestoning nasal exam with some clear rhinorrhea neck supple w/o la regular rate and rhythm clear to auscultation bilaterally no stidor, no increase in wob skin with blanching maculopapular erythematous lesions w/o scale or ulceration diffusely on trunk  but not on palms, no oral lesions   Impression & Recommendations:  Problem # 1:  SKIN RASH (ICD-782.1) This may be a viral exanthem (with resolving viral process, the cough is improved as is the ST).  I don't think this is related to the antibiotics.  I would finish the antibiotics and see if all of this didn't resolve.  She is okay for outpatient follow up.   She agrees.  Complete Medication List: 1)  Lomotil 2.5-0.025 Mg Tabs (Diphenoxylate-atropine) .Marland Kitchen.. 1 tab before going out for a meal to prevent diarrhea 2)  Colestipol Hcl 1 Gm Tabs (Colestipol hcl) .... 2 by mouth two times a day before meals 3)  Nitrolingual 0.4 Mg/spray Soln (Nitroglycerin) .... One spray under tongue every 5 minutes as needed for chest pain---may repeat times three 4)  Hydrochlorothiazide 25 Mg Tabs (Hydrochlorothiazide) .... Take one tablet by mouth daily. 5)  Fish Oil 1000 Mg Caps (Omega-3 fatty acids) .Marland Kitchen.. 1 by mouth once daily 6)  Viactiv 500-500-40 Mg-unt-mcg Chew (Calcium-vitamin d-vitamin k) .Marland Kitchen.. 1 by mouth once daily 7)  Folic Acid 800 Mcg Tabs (Folic acid) .... Once daily 8)  Vitamin D3 1000  Unit Tabs (Cholecalciferol) .... Once daily 9)  Epi Pen  .... As directed 10)  Potassium Gluconate 550 Mg Tabs (Potassium gluconate) .... One tablet once daily 11)  Aspirin 81 Mg Tbec (Aspirin) .... Take one tablet by mouth daily 12)  Keflex 500 Mg Caps (Cephalexin) .... Take 1 capsule by mouth two times a day  for 5 days.  Patient Instructions: 1)  I would finish the amoxil.  I wouldn't take the keflex.  I think this will gradually resolve.  It is likely that the rash is related to a cold.  Let me know if your symptoms continue.     Orders Added: 1)  Est. Patient Level III [16109]    Current Allergies (reviewed today): ! * BLOOD TRANSFUSION ! SULFA ! PREDNISONE ! * IVP DYE ! EPINEPHRINE ! * NITROFURANTOIN ! ACE INHIBITORS ! HYDROCODONE ! * TAMADOL ! * PROPOXYPHEN ! AMOXICILLIN ! * BEE STINGS

## 2011-03-12 ENCOUNTER — Encounter: Payer: Self-pay | Admitting: Internal Medicine

## 2011-03-12 DIAGNOSIS — D801 Nonfamilial hypogammaglobulinemia: Secondary | ICD-10-CM | POA: Insufficient documentation

## 2011-03-12 DIAGNOSIS — I341 Nonrheumatic mitral (valve) prolapse: Secondary | ICD-10-CM | POA: Insufficient documentation

## 2011-03-12 DIAGNOSIS — F40240 Claustrophobia: Secondary | ICD-10-CM

## 2011-03-12 DIAGNOSIS — K449 Diaphragmatic hernia without obstruction or gangrene: Secondary | ICD-10-CM

## 2011-03-12 DIAGNOSIS — E785 Hyperlipidemia, unspecified: Secondary | ICD-10-CM | POA: Insufficient documentation

## 2011-03-12 DIAGNOSIS — I209 Angina pectoris, unspecified: Secondary | ICD-10-CM

## 2011-03-12 DIAGNOSIS — M5430 Sciatica, unspecified side: Secondary | ICD-10-CM | POA: Insufficient documentation

## 2011-03-12 DIAGNOSIS — M1990 Unspecified osteoarthritis, unspecified site: Secondary | ICD-10-CM | POA: Insufficient documentation

## 2011-03-12 DIAGNOSIS — Q632 Ectopic kidney: Secondary | ICD-10-CM | POA: Insufficient documentation

## 2011-03-12 DIAGNOSIS — I10 Essential (primary) hypertension: Secondary | ICD-10-CM | POA: Insufficient documentation

## 2011-03-12 DIAGNOSIS — K5730 Diverticulosis of large intestine without perforation or abscess without bleeding: Secondary | ICD-10-CM | POA: Insufficient documentation

## 2011-03-13 ENCOUNTER — Encounter: Payer: Self-pay | Admitting: Internal Medicine

## 2011-03-13 ENCOUNTER — Ambulatory Visit (INDEPENDENT_AMBULATORY_CARE_PROVIDER_SITE_OTHER): Payer: Medicare Other

## 2011-03-13 DIAGNOSIS — D510 Vitamin B12 deficiency anemia due to intrinsic factor deficiency: Secondary | ICD-10-CM

## 2011-03-20 NOTE — Assessment & Plan Note (Signed)
Summary: B12 SHOT / LFW  Nurse Visit   Allergies: 1)  ! * Blood Transfusion 2)  ! Sulfa 3)  ! Prednisone 4)  ! * Ivp Dye 5)  ! Epinephrine 6)  ! * Nitrofurantoin 7)  ! Ace Inhibitors 8)  ! Hydrocodone 9)  ! * Tamadol 10)  ! * Propoxyphen 11)  ! Amoxicillin 12)  ! * Bee Stings  Medication Administration  Injection # 1:    Medication: Vit B12 1000 mcg    Diagnosis: PERNICIOUS ANEMIA (ICD-281.0)    Route: IM    Site: R deltoid    Exp Date: 09/30/2012    Lot #: [id number]    Mfr: American Regent    Patient tolerated injection without complications    Given by: Linde Gillis CMA Duncan Dull) (March 13, 2011 8:52 AM)  Orders Added: 1)  Vit B12 1000 mcg [J3420] 2)  Admin of Therapeutic Inj  intramuscular or subcutaneous [04540]

## 2011-04-18 ENCOUNTER — Ambulatory Visit: Payer: Medicare Other

## 2011-04-19 ENCOUNTER — Ambulatory Visit (INDEPENDENT_AMBULATORY_CARE_PROVIDER_SITE_OTHER): Payer: Medicare Other | Admitting: Internal Medicine

## 2011-04-19 DIAGNOSIS — D510 Vitamin B12 deficiency anemia due to intrinsic factor deficiency: Secondary | ICD-10-CM

## 2011-04-19 MED ORDER — CYANOCOBALAMIN 1000 MCG/ML IJ SOLN
1000.0000 ug | Freq: Once | INTRAMUSCULAR | Status: AC
  Administered 2011-04-19: 1000 ug via INTRAMUSCULAR

## 2011-04-19 NOTE — Progress Notes (Signed)
  Subjective:    Patient ID: Mallory Rodriguez, female    DOB: 1933-11-12, 77 y.o.   MRN: [id number]  HPI Seen for B12 shot   Review of Systems     Objective:   Physical Exam        Assessment & Plan:

## 2011-05-08 ENCOUNTER — Ambulatory Visit (INDEPENDENT_AMBULATORY_CARE_PROVIDER_SITE_OTHER): Payer: Medicare Other | Admitting: Internal Medicine

## 2011-05-08 ENCOUNTER — Encounter: Payer: Self-pay | Admitting: Internal Medicine

## 2011-05-08 VITALS — BP 126/60 | HR 70 | Temp 98.5°F | Ht 61.0 in | Wt 156.0 lb

## 2011-05-08 DIAGNOSIS — I209 Angina pectoris, unspecified: Secondary | ICD-10-CM

## 2011-05-08 DIAGNOSIS — I059 Rheumatic mitral valve disease, unspecified: Secondary | ICD-10-CM

## 2011-05-08 DIAGNOSIS — M1990 Unspecified osteoarthritis, unspecified site: Secondary | ICD-10-CM

## 2011-05-08 DIAGNOSIS — I10 Essential (primary) hypertension: Secondary | ICD-10-CM

## 2011-05-08 DIAGNOSIS — F411 Generalized anxiety disorder: Secondary | ICD-10-CM

## 2011-05-08 DIAGNOSIS — E785 Hyperlipidemia, unspecified: Secondary | ICD-10-CM

## 2011-05-08 LAB — HEPATIC FUNCTION PANEL
ALT: 28 U/L (ref 0–35)
Bilirubin, Direct: 0.1 mg/dL (ref 0.0–0.3)
Total Bilirubin: 0.7 mg/dL (ref 0.3–1.2)

## 2011-05-08 LAB — CBC WITH DIFFERENTIAL/PLATELET
Basophils Relative: 1.4 % (ref 0.0–3.0)
Eosinophils Absolute: 0.1 10*3/uL (ref 0.0–0.7)
Hemoglobin: 12.9 g/dL (ref 12.0–15.0)
MCHC: 34.3 g/dL (ref 30.0–36.0)
MCV: 95.4 fl (ref 78.0–100.0)
Monocytes Absolute: 0.5 10*3/uL (ref 0.1–1.0)
Neutro Abs: 3.2 10*3/uL (ref 1.4–7.7)
RBC: 3.95 Mil/uL (ref 3.87–5.11)

## 2011-05-08 LAB — BASIC METABOLIC PANEL
CO2: 28 mEq/L (ref 19–32)
Calcium: 9.4 mg/dL (ref 8.4–10.5)
GFR: 85.2 mL/min (ref >60.00)
Potassium: 4 mEq/L (ref 3.5–5.1)
Sodium: 143 mEq/L (ref 135–145)

## 2011-05-08 LAB — LDL CHOLESTEROL, DIRECT: Direct LDL: 93.8 mg/dL

## 2011-05-08 LAB — URIC ACID: Uric Acid, Serum: 7.2 mg/dL — ABNORMAL HIGH (ref 2.4–7.0)

## 2011-05-08 NOTE — Progress Notes (Signed)
Subjective:    Patient ID: Mallory Rodriguez, female    DOB: 1933-03-04, 77 y.o.   MRN: [id number]  HPI Hit left heel on large chest about 6-7 weeks ago Saw Dr Ether Griffins and had x-rays---had "chip in a spur" Improved then worsened again--had been doing exercises Has been on her feet a lot----getting work done on her house and with grandkids Better with soaking and ice  Now with some pain in the left 1st MTP Worries about gout--never had before This is also better with soaks, etc Had eval again by Dr Inda Castle okay but needs chol checked again Some trouble getting the colestid in --has to be separate from other meds  No recent chest pain Palpitations "only when my husband drives me crazy------he is getting Altzheimers" No edema Breathing has been okay  No sig arthritis pain other than foot Occ back aching if she overdoes it  Current outpatient prescriptions:aspirin 81 MG tablet, Take 81 mg by mouth daily.  , Disp: , Rfl: ;  Calcium-Vitamin D-Vitamin K (VIACTIV) 500-500-40 MG-UNT-MCG CHEW, Chew 1 tablet by mouth daily.  , Disp: , Rfl: ;  Cholecalciferol (EQL VITAMIN D3) 1000 UNITS tablet, Take 1,000 Units by mouth daily.  , Disp: , Rfl: ;  colestipol (COLESTID) 1 G tablet, Take 2 g by mouth 2 (two) times daily.  , Disp: , Rfl:  diphenoxylate-atropine (LOMOTIL) 2.5-0.025 MG per tablet, Take 1 tablet by mouth 3 (three) times daily before meals.  , Disp: , Rfl: ;  EPINEPHrine (EPI-PEN) 0.3 mg/0.3 mL DEVI, Inject 0.3 mg into the muscle once.  , Disp: , Rfl: ;  fish oil-omega-3 fatty acids 1000 MG capsule, Take 1 g by mouth daily.  , Disp: , Rfl: ;  hydrochlorothiazide 25 MG tablet, Take 25 mg by mouth daily.  , Disp: , Rfl:  nitroGLYCERIN (NITROLINGUAL) 0.4 MG/SPRAY spray, Place 1 spray under the tongue every 5 (five) minutes as needed. Up to 3 times , Disp: , Rfl: ;  Potassium Gluconate 550 MG TABS, Take 1 tablet by mouth daily.  , Disp: , Rfl: ;  DISCONTD: cephALEXin (KEFLEX) 500 MG capsule, Take  500 mg by mouth 2 (two) times daily.  , Disp: , Rfl: ;  DISCONTD: folic acid (FOLVITE) 800 MCG tablet, Take 800 mcg by mouth daily.  , Disp: , Rfl:   Past Medical History  Diagnosis Date  . MVP (mitral valve prolapse)     and tricuspid leak  . AP (angina pectoris)   . Hiatal hernia   . Hypogammaglobulinemia     borderline  . Pelvic kidney     lower left side  . Sciatica   . Claustrophobia   . Diverticulosis of colon   . HLD (hyperlipidemia)   . Pernicious anemia   . HTN (hypertension)   . OA (osteoarthritis)     Past Surgical History  Procedure Date  . Appendectomy   . Tonsillectomy   . Cholecystectomy   . Right ovary and tube corpus   . Dilation and curettage of uterus   . Breast lumpectomy     Right (fatty tumor)  . Inguinal hernia repair     Left side  . Excision vestibular glands   . Basal cell carcinoma removal     Back  . Intraocular lens insertion     Bilateral  . Film removed left eye   . Total abdominal hysterectomy   . Meniscus repair 08/2009    right    Family History  Problem Relation  Age of Onset  . Heart attack Father 26    CABG  . Dementia Mother   . Cancer Maternal Aunt     Breast-multiple aunts and Gm  . Cancer Maternal Uncle     Bladder and lung  . Diabetes Paternal Grandmother   . Hypertension Father   . Hypertension Brother     History   Social History  . Marital Status: Married    Spouse Name: N/A    Number of Children: 2  . Years of Education: N/A   Occupational History  . Retired-bookkeeping/secretarial work     retired  .      Social History Main Topics  . Smoking status: Never Smoker   . Smokeless tobacco: Not on file  . Alcohol Use: Yes     Regular wine in general  . Drug Use: No  . Sexually Active: Not on file   Other Topics Concern  . Not on file   Social History Narrative  . No narrative on file   Review of Systems Goes monthly to chiropractor Sleeps okay. Often falls asleep in evening shows Weight has  gone up a bit Has noticed some hair loss but chin hairs are growing--worries about thyroid Still occ gets diarrhea Occ blood on toilet paper--none in stool. Recent colonoscopy UTI is better    Objective:   Physical Exam  Constitutional: She appears well-developed and well-nourished. No distress.  Neck: Normal range of motion. Neck supple. No thyromegaly present.  Cardiovascular: Normal rate, regular rhythm, normal heart sounds and intact distal pulses.  Exam reveals no gallop.   No murmur heard. Pulmonary/Chest: Effort normal and breath sounds normal. No respiratory distress. She has no wheezes. She has no rales.  Abdominal: Soft. There is no tenderness.  Musculoskeletal: Normal range of motion. She exhibits no edema.       Mild erythema and slight tenderness of left 1st MTP  Lymphadenopathy:    She has no cervical adenopathy.  Psychiatric: She has a normal mood and affect. Her behavior is normal. Judgment and thought content normal.          Assessment & Plan:

## 2011-05-18 NOTE — Discharge Summary (Signed)
NAMETINLEY, ROUGHT                ACCOUNT NO.:  [id number]   MEDICAL RECORD NO.:  [id number]          PATIENT TYPE:  INP   LOCATION:  9320                          FACILITY:  [facility]   PHYSICIAN:  Ginger Carne, MD  DATE OF BIRTH:  [birth year]   DATE OF ADMISSION:  01/09/2005  DATE OF DISCHARGE:                                 DISCHARGE SUMMARY   FINAL DIAGNOSES:  Right retained ovarian syndrome and chronic right lower  quadrant pain.   IN HOSPITAL PROCEDURES:  Total abdominal hysterectomy, right salpingo-  oophorectomy with lysis of adhesions.   HOSPITAL COURSE:  This patient is a 77 year old Caucasian female who  underwent the aforementioned procedure on January 09, 2005 because of a  history of chronic right lower quadrant pain with tenderness.  The patient  in the past had known about a retained ovary despite negative findings on  CAT scan.  Intraoperatively, the patient was noted to have a left pelvic  kidney in addition to adherence of the right tube and ovary as well as the  uterus to its respective sidewalls.  Numerous omental and intraperitoneal  adhesions were also noted.  Her postoperative course was uneventful, she was  afebrile, voided well and her incision was dry.  Postoperative hemoglobin  was 10.4 from a preoperative 13.1. Pathology report returned findings of a  benign ovary and tube on the right side in addition to no abnormal findings  of said uterus and cervix.  Routine postoperative instructions provided to  said patient. Percocet 5/325 provided said patient. She was asked to return  in five days to have her staples removed. The patient was apprised of  pathology findings and all questions answered to patient's satisfaction.  Her lungs were clear, her incision was dry and her calves were without  tenderness.     Stev   SHB/MEDQ  D:  01/12/2005  T:  01/12/2005  Job:  [id number]

## 2011-05-18 NOTE — Op Note (Signed)
Mallory Rodriguez, Mallory Rodriguez                ACCOUNT NO.:  [id number]   MEDICAL RECORD NO.:  [id number]          PATIENT TYPE:  INP   LOCATION:  9399                          FACILITY:  [facility]   PHYSICIAN:  Ginger Carne, MD  DATE OF BIRTH:  19-Mar-1933   DATE OF PROCEDURE:  01/09/2005  DATE OF DISCHARGE:                                 OPERATIVE REPORT   PREOPERATIVE DIAGNOSES:  1.  Right retained ovarian syndrome.  2.  Right lower quadrant pain.   POSTOPERATIVE DIAGNOSIS:  1.  Right retained ovarian syndrome.  2.  Right lower quadrant pain.   PROCEDURE:  Total abdominal hysterectomy, right salpingo-oophorectomy.   SURGEON:  Ginger Carne, M.D.   ASSISTANT:  None.   COMPLICATIONS:  None immediate.   ESTIMATED BLOOD LOSS:  450 cc.   ANESTHESIA:  General.   OPERATIVE FINDINGS:  Upon opening the abdomen, there were significant  adhesions of the omentum to the cul-de-sac and right lower quadrant.  The  patient had a known __________ left pelvic kidney.  The sigmoid colon had  been adherent to the right lower quadrant in the pelvis and this revealed,  after further dissection, right tube and ovary which were immediately  adherent to its respective sidewall with a small atrophic uterus and cervix  on the same side which was dispatched and displaced to the right side.  The  ureter was carefully identified throughout its pelvic course on the right  side to assure that there was no injury.   OPERATIVE PROCEDURE:  The patient was prepped and draped in usual fashion  and placed in the supine position.  Betadine solution was used for  antiseptic and the patient was catheterized prior to the procedure.  After  adequate general anesthesia, the old vertical incision was taken out and the  abdomen opened.  Meticulous dissection of omentum and large and small bowel  was necessary to reveal the right adnexal structures and the uterus and  cervix.  Ovary measured approximately 2 x 2 cm and  felt solid.   Dissection of the ureter was carried out on the right side to assure careful  visualization of said structure while clamping vasculature.  The right  infundibulopelvic ligament was clamped, cut, and ligated with 0 Vicryl  suture.  After dissecting the appropriate peritoneal reflections, the  uterine vasculature was clamped and ligated with 0 Vicryl suture.  The  bladder was dissected freely off the uterus and cervix and the posterior  peritoneum was also dissected.  The uterosacral ligaments were identified.  At this point, the uterine vasculature was clamped, cut, and ligated in a  standard fashion.  The uterus and cervix were then removed from the vaginal  cuff.  Bleeding points hemostatically checked and the closure of the cuff  with 0 Vicryl running interlocking suture.  Meticulous hemostasis was  carried out throughout the pelvis.  Afterwards copious irrigation with  lactated Ringer's followed.  Lap count was correct.  Closure of the fascia  and peritoneum in one layer with 0 PDS suture running from either end to  midline, 3-0  chromic for the subcutaneous closure, and skin staples for the  skin.  Instrument and sponge count were correct.  The patient tolerated the  procedure well and returned to the postanesthesia recovery room in excellent  condition.     Stev   SHB/MEDQ  D:  01/09/2005  T:  01/09/2005  Job:  [id number]

## 2011-05-18 NOTE — H&P (Signed)
Mallory Rodriguez, Mallory Rodriguez                ACCOUNT NO.:  [id number]   MEDICAL RECORD NO.:  [id number]          PATIENT TYPE:  INP   LOCATION:  NA                            FACILITY:  [facility]   PHYSICIAN:  Ginger Carne, MD  DATE OF BIRTH:  [birth year]   DATE OF ADMISSION:  01/09/2005  DATE OF DISCHARGE:                                HISTORY & PHYSICAL   ADMITTING DIAGNOSIS:  Right retained ovarian syndrome  and chronic right  lower quadrant pain.   IN-HOSPITAL PROCEDURES:  Laparotomy with lysis of adhesions, right  salpingoophorectomy and total abdominal hysterectomy   HISTORY OF PRESENT ILLNESS:  This patient is a 77 year old, gravida 3, para  2-0-1-2, Caucasian female with a 5-6 year longstanding history of chronic  right lower quadrant pain.  The patient has had a complicated surgical  course in the past, however, of note is that in 1951 she had undergone an  appendectomy with a partial right cystectomy.  At that time she was told  that there was no evidence for a left tube or ovary implying that she was  born without said left adnexal structures.  In 1982, she had undergone  further surgery including what was supposed to have been at the time a lysis  of adhesions and a right salpingo-oophorectomy because of a cyst onset  ovary.  Despite said surgery, she continued to have menses for an additional  two years on a 30 day basis until 1984, when she went through menopause.  She has done well until approximately five years ago when she began having  intermittent pain which has worsened over the past six months but over time  has continued to be more constant and stronger.  A CT scan without contrast  (the patient has an allergy to IVP dye) demonstrates no evidence for kidney  stones, ureteral stones, or any other abnormalities of the abdomen and  pelvis.  An ultrasound demonstrates evidence for adnexal a 3 cm solid right  adnexal mass.  The patient states that her pain has been  considerably  worsened over time to the point of requiring nonsteroidal anti-inflammatory  agents.  She has no genitourinary, musculoskeletal, or gastrointestinal  sources for said pain.   OBSTETRICAL/GYNECOLOGICAL HISTORY:  1.  The patient has had two full term vaginal deliveries in 1958 and 1973.  2.  The patient had a miscarriage in 2.  3.  She is presently menopausal.   ALLERGIES:  1.  SULFA.  2.  PREDNISONE.  3.  IVP DYE.  4.  NEOSPORIN.  5.  POLYSPORIN.  6.  MACROBID.   CURRENT MEDICATIONS:  1.  Triamterene/hydrochlorothiazide 37.5/25.  2.  Celexa 20 mg daily.  3.  Nitroglycerin inhaler as needed.  4.  EpiPen as needed.   MEDICAL HISTORY:  Hypertension, non-anginal iatrogenic chest pain. In  February 2004 patient developed chest pain on lipitor. Evaluated at [facility] including cardiac angiogram and nuclear stress test revealing no  abnormalities. Re-evaluated in March 2005 in [place], [place] with  cardiac echogram and stress test reported as normal. Patient denies any  chest pain following discontinuation of Lipitor and has occassional chest  discomfort with emotional stress. Denies anginal pain at rest, with exertion  or exercise. She actively engages in bowling and swimming with symptoms.  Utilizes nitro aerosol no more than once every one or two months when under  emotional strain. Records are being obtained from [place], [place].   FAMILY HISTORY:  Includes no first degree relatives with breast, colon,  ovarian, or uterine carcinoma.   SURGICAL HISTORY:  1.  In 1951, a cystectomy with the right ovary with appendectomy.  2.  In 1952, a tonsillectomy.  3.  In 1972, a cholecystectomy.  4.  In 1982, a right salpingo-oophorectomy with lysis of adhesions.  5.  In 1985, a right breast lumpectomy for benign growth.  6.  In 1989, an inguinal herniorrhaphy.  7.  In 1989, an excision of vestibular glands.  8.  In 2002, basal cell carcinoma removal from the  back.  9.  In 2002 and 2003, intraocular lens implants.   SOCIAL HISTORY:  Negative for smoking, illicit drug abuse, or alcohol.   REVIEW OF SYSTEMS:  Negative for chest pain, shortness of breath, or angina  to physical exertion.   PHYSICAL EXAMINATION:  VITAL SIGNS:  Weight 147 pounds, blood pressure  122/50, and height is 5 foot 2 inches.  HEENT:  Grossly normal.  CHEST:  Clear to percussion auscultation.  CARDIAC:  Without murmurs or enlargement.  BREASTS:  Without masses, discharge, thickening, or tenderness.  EXTREMITIES:  Within normal limits.  LYMPHATICS:  Within normal limits.  SKIN:  Within normal limits.  NEUROLOGIC:  Within normal limits.  MUSCULOSKELETAL:  Within normal limits.  ABDOMEN:  Soft without gross hepatosplenomegaly.  PELVIC:  External genitalia, vulva, and vagina normal.  Cervix smooth  without erosions or lesions.  Pap smear is normal.  The uterus is small,  anteverted, and flexed.  Both adnexa are palpable with tenderness in the  right adnexa.  The left is normal.  RECTAL:  Hemoccult negative without masses.   IMPRESSION:  1.  Possible right retained ovarian syndrome.  2.  Chronic right lower quadrant pain.   PLAN:  By history, it is certainly possible that the patient may have a  retained remnant of her right ovary.  It is possible that the said structure  could still be present and not picked up on said scans.  Because of the  patient's prior surgical history, she understands that a laparoscopy may not  be safe and that a laparotomy will be necessary to conduct a careful  exploration of the pelvis and utilize spinal analgesia.  The patient wishes  to have her uterus removed if a mass is found in the right adnexa.  Ashby Dawes  of said surgery was discussed in detail and the patient  understands the risks include injuries to ureter, bowel, and bladder, hemorrhage possibly requiring a blood transfusion and/or infection.  The  patient understands that it is  possible that a laparotomy will be necessary  for diagnostic purposes as well as for therapeutic purposes to evaluate the  cause of the said right lower quadrant pain.     Stev   SHB/MEDQ  D:  01/06/2005  T:  01/06/2005  Job:  [id number]

## 2011-05-24 ENCOUNTER — Ambulatory Visit (INDEPENDENT_AMBULATORY_CARE_PROVIDER_SITE_OTHER): Payer: Medicare Other | Admitting: Internal Medicine

## 2011-05-24 DIAGNOSIS — E538 Deficiency of other specified B group vitamins: Secondary | ICD-10-CM

## 2011-05-24 MED ORDER — CYANOCOBALAMIN 1000 MCG/ML IJ SOLN
1000.0000 ug | Freq: Once | INTRAMUSCULAR | Status: AC
  Administered 2011-05-24: 1000 ug via INTRAMUSCULAR

## 2011-05-24 NOTE — Progress Notes (Signed)
  Subjective:    Patient ID: Mallory Rodriguez, female    DOB: 30-Aug-1933, 77 y.o.   MRN: [id number]  HPI Here for B12 injection   Review of Systems     Objective:   Physical Exam        Assessment & Plan:

## 2011-05-29 ENCOUNTER — Other Ambulatory Visit: Payer: Self-pay | Admitting: *Deleted

## 2011-05-29 NOTE — Telephone Encounter (Signed)
Form on your desk  

## 2011-05-30 ENCOUNTER — Other Ambulatory Visit: Payer: Self-pay | Admitting: *Deleted

## 2011-05-30 MED ORDER — COLESTIPOL HCL 1 G PO TABS: 2.0000 g | ORAL_TABLET | Freq: Two times a day (BID) | ORAL | Status: DC

## 2011-05-30 NOTE — Telephone Encounter (Signed)
Rx refill request for Colestipol HCL  1 gm tablet - take 2 tablets by mouth twice a day before meals last refill 04/06/2011 qty 120

## 2011-05-30 NOTE — Telephone Encounter (Signed)
rx faxed to pharmacy manually  

## 2011-05-30 NOTE — Telephone Encounter (Signed)
Okay to refill x 1 year Can go electronically

## 2011-05-30 NOTE — Telephone Encounter (Signed)
This might have been done already but okay to refill for a year

## 2011-05-30 NOTE — Telephone Encounter (Signed)
rx already done.

## 2011-06-18 LAB — HM DEXA SCAN

## 2011-06-28 ENCOUNTER — Ambulatory Visit: Payer: Medicare Other

## 2011-07-02 ENCOUNTER — Ambulatory Visit: Payer: BC Managed Care – HMO | Admitting: Obstetrics and Gynecology

## 2011-07-03 ENCOUNTER — Ambulatory Visit (INDEPENDENT_AMBULATORY_CARE_PROVIDER_SITE_OTHER): Payer: Medicare Other | Admitting: Internal Medicine

## 2011-07-03 DIAGNOSIS — D510 Vitamin B12 deficiency anemia due to intrinsic factor deficiency: Secondary | ICD-10-CM

## 2011-07-03 MED ORDER — CYANOCOBALAMIN 1000 MCG/ML IJ SOLN
1000.0000 ug | Freq: Once | INTRAMUSCULAR | Status: AC
  Administered 2011-07-03: 1000 ug via INTRAMUSCULAR

## 2011-07-03 NOTE — Progress Notes (Signed)
B12 injection given during nurse visit today. 

## 2011-07-16 LAB — HM MAMMOGRAPHY: HM Mammogram: NEGATIVE

## 2011-08-07 ENCOUNTER — Encounter: Payer: Self-pay | Admitting: Family Medicine

## 2011-08-07 ENCOUNTER — Ambulatory Visit (INDEPENDENT_AMBULATORY_CARE_PROVIDER_SITE_OTHER): Payer: Medicare Other | Admitting: Family Medicine

## 2011-08-07 VITALS — BP 118/72 | HR 86 | Temp 98.1°F | Ht 62.5 in | Wt 155.0 lb

## 2011-08-07 DIAGNOSIS — R300 Dysuria: Secondary | ICD-10-CM

## 2011-08-07 DIAGNOSIS — N390 Urinary tract infection, site not specified: Secondary | ICD-10-CM

## 2011-08-07 LAB — POCT URINALYSIS DIPSTICK
Bilirubin, UA: NEGATIVE
Glucose, UA: NEGATIVE
Nitrite, UA: POSITIVE
pH, UA: 6

## 2011-08-07 MED ORDER — NITROFURANTOIN MONOHYD MACRO 100 MG PO CAPS: 100.0000 mg | ORAL_CAPSULE | Freq: Two times a day (BID) | ORAL | Status: AC

## 2011-08-07 NOTE — Progress Notes (Signed)
  Subjective:    Patient ID: Mallory Rodriguez, female    DOB: 1933-10-06, 77 y.o.   MRN: [id number]  HPI  Having a lot of pressure. A lot of pressure in her bladder This time back is hurting worse Also has a pelvic kidney.   The PMH, PSH, Social History, Family History, Medications, and allergies have been reviewed in [facility], and have been updated if relevant.  Review of Systems ROS: GEN: Acute illness details above GI: Tolerating PO intake GU: maintaining adequate hydration and urination Pulm: No SOB Interactive and getting along well at home.  Otherwise, ROS is as per the HPI.     Objective:   Physical Exam   Physical Exam  Blood pressure 118/72, pulse 86, temperature 98.1 F (36.7 C), temperature source Oral, height 5' 2.5" (1.588 m), weight 155 lb (70.308 kg), SpO2 97.00%.  GEN: WDWN, A&Ox4,NAD. Non-toxic HEENT: Atraumatc, normocephalic. CV: RRR, No M/G/R PULM: CTA B, No wheezes, crackles, or rhonchi ABD: S, NT, ND, +BS, no rebound. No CVAT. + suprapubic tenderness. EXT: No c/c/e       Assessment & Plan:   1. Dysuria  POCT Urinalysis Dipstick, nitrofurantoin, macrocrystal-monohydrate, (MACROBID) 100 MG capsule, Urine culture  2. UTI  nitrofurantoin, macrocrystal-monohydrate, (MACROBID) 100 MG capsule, Urine culture   Check cx also

## 2011-08-10 LAB — URINE CULTURE: Colony Count: 25000

## 2011-08-14 ENCOUNTER — Telehealth: Payer: Self-pay | Admitting: *Deleted

## 2011-08-14 NOTE — Telephone Encounter (Signed)
Pt called back, says she is miserable and [place].  She was taking macrobid, doesn't think that worked very well, since she still has symptoms.  She is asking that something be called in today.

## 2011-08-14 NOTE — Telephone Encounter (Signed)
Will call in medication sensitivites are back..  Sensitive to penicillin, ampicillin,levofloxacine, vancomycin  RESISTANT to: Clindamycin, and erythromycin

## 2011-08-14 NOTE — Telephone Encounter (Signed)
Please assist

## 2011-08-14 NOTE — Telephone Encounter (Signed)
I have been trying to locate the sensitivities with her urine culture -- sorry for delay. Did not get sent to me and we had to call lab.  Levaquin 500 mg, 1 po daily, #7, 0 refills  Please send in

## 2011-08-14 NOTE — Telephone Encounter (Signed)
Patient says she finished her last ABX on Sunday and still is having the pressure, burning and discomfort from the UTI.  [place] now [place] the [place] and is asking if she could get a refill on the medication or have it changed to a different ABX.  Medication needs to be phoned to [phone] [facility] on [address] [place], [place].

## 2011-08-23 ENCOUNTER — Ambulatory Visit (INDEPENDENT_AMBULATORY_CARE_PROVIDER_SITE_OTHER): Payer: Medicare Other | Admitting: *Deleted

## 2011-08-23 DIAGNOSIS — D510 Vitamin B12 deficiency anemia due to intrinsic factor deficiency: Secondary | ICD-10-CM

## 2011-08-23 MED ORDER — CYANOCOBALAMIN 1000 MCG/ML IJ SOLN
1000.0000 ug | Freq: Once | INTRAMUSCULAR | Status: AC
  Administered 2011-08-23: 1000 ug via INTRAMUSCULAR

## 2011-08-24 ENCOUNTER — Ambulatory Visit (INDEPENDENT_AMBULATORY_CARE_PROVIDER_SITE_OTHER): Payer: Medicare Other | Admitting: Internal Medicine

## 2011-08-24 ENCOUNTER — Encounter: Payer: Self-pay | Admitting: Internal Medicine

## 2011-08-24 DIAGNOSIS — R3915 Urgency of urination: Secondary | ICD-10-CM

## 2011-08-24 DIAGNOSIS — R300 Dysuria: Secondary | ICD-10-CM

## 2011-08-24 DIAGNOSIS — R350 Frequency of micturition: Secondary | ICD-10-CM

## 2011-08-24 LAB — POCT URINALYSIS DIPSTICK
Bilirubin, UA: NEGATIVE
Glucose, UA: NEGATIVE
Leukocytes, UA: NEGATIVE
Nitrite, UA: NEGATIVE
pH, UA: 7

## 2011-08-24 NOTE — Patient Instructions (Signed)
Please try a cranberry tab to take daily Continue the probiotic as well

## 2011-08-24 NOTE — Progress Notes (Signed)
Subjective:    Patient ID: Mallory Rodriguez, female    DOB: 1933/11/06, 77 y.o.   MRN: [id number]  HPI Having some ongoing urine symptoms Still has some pressure and increased frequency (with urgency and small volume) Burning is gone Still no fever No abdominal pain---just suprapubic pressure  Took the levaquin completely Has recently started milk thistle Had just started with a probiotic  Tried green coffee extract for her BP---per Dr Neil Crouch  Current Outpatient Prescriptions on File Prior to Visit  Medication Sig Dispense Refill  . aspirin 81 MG tablet Take 81 mg by mouth daily.        . Calcium-Vitamin D-Vitamin K (VIACTIV) 500-500-40 MG-UNT-MCG CHEW Chew 1 tablet by mouth daily.        . Cholecalciferol (EQL VITAMIN D3) 1000 UNITS tablet Take 1,000 Units by mouth daily.        . colestipol (COLESTID) 1 G tablet Take 2 tablets (2 g total) by mouth 2 (two) times daily.  120 tablet  11  . diphenoxylate-atropine (LOMOTIL) 2.5-0.025 MG per tablet Take 1 tablet by mouth 3 (three) times daily before meals.        Marland Kitchen EPINEPHrine (EPI-PEN) 0.3 mg/0.3 mL DEVI Inject 0.3 mg into the muscle once.        . fish oil-omega-3 fatty acids 1000 MG capsule Take 1 g by mouth daily.        . hydrochlorothiazide 25 MG tablet Take 25 mg by mouth daily.        . nitroGLYCERIN (NITROLINGUAL) 0.4 MG/SPRAY spray Place 1 spray under the tongue every 5 (five) minutes as needed. Up to 3 times       . Potassium Gluconate 550 MG TABS Take 1 tablet by mouth daily.          Allergies  Allergen Reactions  . Ace Inhibitors   . Amoxicillin     REACTION: Terrible diarrhea and rash and ST  . Epinephrine   . Hydrocodone   . Prednisone   . Sulfonamide Derivatives     Past Medical History  Diagnosis Date  . MVP (mitral valve prolapse)     and tricuspid leak  . AP (angina pectoris)   . Hiatal hernia   . Hypogammaglobulinemia     borderline  . Pelvic kidney     lower left side  . Sciatica   . Claustrophobia   .  Diverticulosis of colon   . HLD (hyperlipidemia)   . Pernicious anemia   . HTN (hypertension)   . OA (osteoarthritis)     Past Surgical History  Procedure Date  . Appendectomy   . Tonsillectomy   . Cholecystectomy   . Right ovary and tube corpus   . Dilation and curettage of uterus   . Breast lumpectomy     Right (fatty tumor)  . Inguinal hernia repair     Left side  . Excision vestibular glands   . Basal cell carcinoma removal     Back  . Intraocular lens insertion     Bilateral  . Film removed left eye   . Total abdominal hysterectomy   . Meniscus repair 08/2009    right    Family History  Problem Relation Age of Onset  . Heart attack Father 73    CABG  . Dementia Mother   . Cancer Maternal Aunt     Breast-multiple aunts and Gm  . Cancer Maternal Uncle     Bladder and lung  . Diabetes Paternal Grandmother   .  Hypertension Father   . Hypertension Brother     History   Social History  . Marital Status: Married    Spouse Name: N/A    Number of Children: 2  . Years of Education: N/A   Occupational History  . Retired-bookkeeping/secretarial work     retired  .      Social History Main Topics  . Smoking status: Never Smoker   . Smokeless tobacco: Not on file  . Alcohol Use: Yes     Regular wine in general  . Drug Use: No  . Sexually Active: Not on file   Other Topics Concern  . Not on file   Social History Narrative   Has living will and advance directives. Requests husband, then daughter to make health care decisions.Would accept resuscitation but no life prolonging technology if cognitively unaware   Review of Systems Appetite is fine No nausea or vomiting    Objective:   Physical Exam  Constitutional: She appears well-developed and well-nourished. No distress.  Abdominal: There is no tenderness.  Musculoskeletal:       No CVA tenderness          Assessment & Plan:

## 2011-08-24 NOTE — Assessment & Plan Note (Signed)
Urinalysis is benign Seems to be over whatever degree of infection she had Mild residual symptoms  Discussed continuing the probiotic Use cranberry till symptoms abate

## 2011-09-14 ENCOUNTER — Other Ambulatory Visit: Payer: Self-pay | Admitting: *Deleted

## 2011-09-14 MED ORDER — DIPHENOXYLATE-ATROPINE 2.5-0.025 MG PO TABS: 1.0000 | ORAL_TABLET | Freq: Three times a day (TID) | ORAL | Status: DC

## 2011-09-14 NOTE — Telephone Encounter (Signed)
rx faxed to pharmacy manually Not used very much

## 2011-09-14 NOTE — Telephone Encounter (Signed)
Okay #90 x 0 See new sig  Please make sure she should use this very sparingly (I think she does since we haven't filled it on Epic before)

## 2011-09-14 NOTE — Telephone Encounter (Signed)
Form on your desk, ok to fill?

## 2011-09-27 ENCOUNTER — Ambulatory Visit: Payer: Medicare Other

## 2011-09-27 ENCOUNTER — Ambulatory Visit (INDEPENDENT_AMBULATORY_CARE_PROVIDER_SITE_OTHER): Payer: Medicare Other | Admitting: *Deleted

## 2011-09-27 DIAGNOSIS — D510 Vitamin B12 deficiency anemia due to intrinsic factor deficiency: Secondary | ICD-10-CM

## 2011-09-27 DIAGNOSIS — Z23 Encounter for immunization: Secondary | ICD-10-CM

## 2011-09-27 MED ORDER — CYANOCOBALAMIN 1000 MCG/ML IJ SOLN
1000.0000 ug | Freq: Once | INTRAMUSCULAR | Status: AC
  Administered 2011-09-27: 1000 ug via INTRAMUSCULAR

## 2011-10-19 ENCOUNTER — Encounter: Payer: Self-pay | Admitting: Family Medicine

## 2011-10-19 ENCOUNTER — Ambulatory Visit (INDEPENDENT_AMBULATORY_CARE_PROVIDER_SITE_OTHER): Payer: Medicare Other | Admitting: Family Medicine

## 2011-10-19 VITALS — BP 140/74 | HR 76 | Temp 98.8°F | Wt 158.5 lb

## 2011-10-19 DIAGNOSIS — J40 Bronchitis, not specified as acute or chronic: Secondary | ICD-10-CM

## 2011-10-19 MED ORDER — AZITHROMYCIN 250 MG PO TABS: ORAL_TABLET | ORAL | Status: AC

## 2011-10-19 MED ORDER — BENZONATATE 100 MG PO CAPS: 100.0000 mg | ORAL_CAPSULE | Freq: Three times a day (TID) | ORAL | Status: DC | PRN

## 2011-10-19 NOTE — Assessment & Plan Note (Signed)
Given duration of sxs and worsening cough, recommend treat with zpack. Tessalon perls for cough. Update [place] if not improving, fever >101, or worsening.

## 2011-10-19 NOTE — Progress Notes (Signed)
  Subjective:    Patient ID: Mallory Rodriguez, female    DOB: 01/28/1933, 77 y.o.   MRN: [id number]  HPI CC: cough  3 week history of cough and congestion.  Cough productive of yellow sputum worse first thing in am.  Worsening sputum production in last week.  Not getting over this cold.  Taking airborne as well as plenty of water.  initially with chills also with itchy eyes and ears.  Mild ST but that's better.  Also tried mucinex D for 1 week.  Caused worsening diarrhea.  Also tried benadryl at night.  No fevers, abd pain, n/v, rashes.  No sick contacts.  No smokers at home.  No h/o asthma or COPD.  Borderline hypogammaglobulinemia.  Review of Systems Per HPI    Objective:   Physical Exam  Nursing note and vitals reviewed. Constitutional: She appears well-developed and well-nourished. No distress.       Evidently congested  HENT:  Head: Normocephalic and atraumatic.  Right Ear: External ear normal.  Left Ear: External ear normal.  Nose: No mucosal edema or rhinorrhea. Right sinus exhibits no maxillary sinus tenderness and no frontal sinus tenderness. Left sinus exhibits no maxillary sinus tenderness and no frontal sinus tenderness.  Mouth/Throat: Uvula is midline, oropharynx is clear and moist and mucous membranes are normal. No oropharyngeal exudate.  Eyes: Conjunctivae and EOM are normal. Pupils are equal, round, and reactive to light. No scleral icterus.  Neck: Normal range of motion. Neck supple.  Cardiovascular: Normal rate, regular rhythm, normal heart sounds and intact distal pulses.   No murmur heard. Pulmonary/Chest: Effort normal and breath sounds normal. No respiratory distress. She has no wheezes. She has no rales.  Lymphadenopathy:    She has no cervical adenopathy.  Skin: Skin is warm and dry. No rash noted.  Psychiatric: She has a normal mood and affect.          Assessment & Plan:

## 2011-10-19 NOTE — Patient Instructions (Signed)
Sounds like a bronchitis.  Treat with zpack.  Update [place] if not improving as expected. Tessalon perls for cough - swallow don't chew. Good to see you today, call us with questions. Let me know if fever >101.5, or worsening cough.

## 2011-11-01 ENCOUNTER — Ambulatory Visit (INDEPENDENT_AMBULATORY_CARE_PROVIDER_SITE_OTHER): Payer: Medicare Other | Admitting: Family Medicine

## 2011-11-01 ENCOUNTER — Ambulatory Visit: Payer: Medicare Other

## 2011-11-01 ENCOUNTER — Encounter: Payer: Self-pay | Admitting: Family Medicine

## 2011-11-01 VITALS — BP 150/80 | HR 66 | Temp 98.1°F | Wt 159.4 lb

## 2011-11-01 DIAGNOSIS — E538 Deficiency of other specified B group vitamins: Secondary | ICD-10-CM

## 2011-11-01 DIAGNOSIS — J40 Bronchitis, not specified as acute or chronic: Secondary | ICD-10-CM

## 2011-11-01 MED ORDER — CYANOCOBALAMIN 1000 MCG/ML IJ SOLN
1000.0000 ug | Freq: Once | INTRAMUSCULAR | Status: AC
  Administered 2011-11-01: 1000 ug via INTRAMUSCULAR

## 2011-11-01 MED ORDER — LEVOFLOXACIN 500 MG PO TABS: 500.0000 mg | ORAL_TABLET | Freq: Every day | ORAL | Status: AC

## 2011-11-01 NOTE — Assessment & Plan Note (Signed)
Continued cough, mucous congestion/drainage, however overall improved. I don't see evidence of continued infection today, reassured, recommend simple mucinex, nasal saline, and tessalon perls for cough at night. Provided with levaquin script in case any worsening given h/o hypogammaglobulinemia.

## 2011-11-01 NOTE — Patient Instructions (Signed)
I'm glad cough is getting better.  I think things are progressing well. Continue nasal saline, use simple mucinex with plenty of fluid, and try tessalon perls (swallow don't chew). Levaquin in case any worsening or not improving after next few days. Good to see you today, call us with questions. Let us know if fever >101.5, or worsening instead of improving.

## 2011-11-01 NOTE — Progress Notes (Signed)
Addended by: Josph Macho A on: 11/01/2011 08:43 AM   Modules accepted: Orders

## 2011-11-01 NOTE — Progress Notes (Signed)
  Subjective:    Patient ID: Mallory Rodriguez, female    DOB: 04/11/1933, 77 y.o.   MRN: [id number]  HPI CC: f/u cough  Seen 10/19/2011 with bronchitis, treatd with zpack and tessalon.  Improved some, but not fully better.  Cough still mainly dry.  Also sneezing a lot.  + chills last night.  Restarted mucinex D which helps congestion but causes muscle cramps.  Decreased appetite.  Overall feeling better (lungs improved) but cough and mucous lingering.  Mild muffled hearing, no headaches, sinus pain or pressure.  No ST.  No fevers, abd pain, nausea.  No sick contacts. No smokers at home. No h/o asthma or COPD.  H/o hypogammaglobulinemia.  Review of Systems Per HPI    Objective:   Physical Exam  Nursing note and vitals reviewed. Constitutional: She appears well-developed and well-nourished. No distress.       Energy improved, breathing and congestion better  HENT:  Head: Normocephalic and atraumatic.  Right Ear: External ear normal.  Left Ear: External ear normal.  Nose: No mucosal edema or rhinorrhea. Right sinus exhibits no maxillary sinus tenderness and no frontal sinus tenderness. Left sinus exhibits no maxillary sinus tenderness and no frontal sinus tenderness.  Mouth/Throat: Uvula is midline, oropharynx is clear and moist and mucous membranes are normal. No oropharyngeal exudate.  Eyes: Conjunctivae and EOM are normal. Pupils are equal, round, and reactive to light. No scleral icterus.  Neck: Normal range of motion. Neck supple. No JVD present. No thyromegaly present.  Cardiovascular: Normal rate, regular rhythm, normal heart sounds and intact distal pulses.   No murmur heard. Pulmonary/Chest: Effort normal and breath sounds normal. No respiratory distress. She has no wheezes. She has no rales.  Musculoskeletal: She exhibits no edema.  Lymphadenopathy:    She has no cervical adenopathy.  Skin: Skin is warm and dry. No rash noted.  Psychiatric: She has a normal mood and affect.       Assessment & Plan:

## 2011-11-14 ENCOUNTER — Other Ambulatory Visit: Payer: Self-pay | Admitting: Internal Medicine

## 2011-11-14 ENCOUNTER — Ambulatory Visit (INDEPENDENT_AMBULATORY_CARE_PROVIDER_SITE_OTHER): Payer: Medicare Other | Admitting: Internal Medicine

## 2011-11-14 ENCOUNTER — Encounter: Payer: Self-pay | Admitting: Internal Medicine

## 2011-11-14 DIAGNOSIS — R197 Diarrhea, unspecified: Secondary | ICD-10-CM

## 2011-11-14 DIAGNOSIS — J40 Bronchitis, not specified as acute or chronic: Secondary | ICD-10-CM

## 2011-11-14 DIAGNOSIS — I10 Essential (primary) hypertension: Secondary | ICD-10-CM

## 2011-11-14 DIAGNOSIS — F411 Generalized anxiety disorder: Secondary | ICD-10-CM

## 2011-11-14 NOTE — Assessment & Plan Note (Signed)
Satisfied with current regimen 

## 2011-11-14 NOTE — Assessment & Plan Note (Signed)
BP Readings from Last 3 Encounters:  11/14/11 156/64  11/01/11 150/80  10/19/11 140/74   Up a little May be related to cold meds, etc Will hold off on Rx though

## 2011-11-14 NOTE — Assessment & Plan Note (Signed)
Improved though not resolved ?allergy component as well Stop mucinex, etc

## 2011-11-14 NOTE — Progress Notes (Signed)
Subjective:    Patient ID: Mallory Rodriguez, female    DOB: 12-29-1933, 77 y.o.   MRN: [id number]  HPI Finally feels that her respiratory infection is better Didn't fill the levaquin Still sneezing Pressure in left ear Cough is better though Clear rhinorrhea Still feels "exhaused"  Needs reeval for her hearing Discussed calling  ENT for eval when she is ready  Takes probiotic--feels it helps Not consistent with cranberry No recent urinary problems  Doesn't check BP  No headaches in general---occ gets brief sharp pain in head No chest pain No SOB  Current Outpatient Prescriptions on File Prior to Visit  Medication Sig Dispense Refill  . Calcium-Vitamin D-Vitamin K (VIACTIV) 500-500-40 MG-UNT-MCG CHEW Chew 1 tablet by mouth daily.        . Cholecalciferol (EQL VITAMIN D3) 1000 UNITS tablet Take 1,000 Units by mouth daily.        . colestipol (COLESTID) 1 G tablet Take 2 tablets (2 g total) by mouth 2 (two) times daily.  120 tablet  11  . diphenoxylate-atropine (LOMOTIL) 2.5-0.025 MG per tablet Take 1-2 tablets by mouth 3 (three) times daily before meals.  90 tablet  0  . EPINEPHrine (EPI-PEN) 0.3 mg/0.3 mL DEVI Inject 0.3 mg into the muscle once.        . fish oil-omega-3 fatty acids 1000 MG capsule Take 1 g by mouth daily.        . hydrochlorothiazide 25 MG tablet Take 25 mg by mouth daily.        . nitroGLYCERIN (NITROLINGUAL) 0.4 MG/SPRAY spray Place 1 spray under the tongue every 5 (five) minutes as needed. Up to 3 times       . Probiotic Product (TRUBIOTICS) CAPS Take 1 capsule by mouth daily.          Allergies  Allergen Reactions  . Ace Inhibitors   . Contrast Media (Iodinated Diagnostic Agents)   . Epinephrine   . Hydrocodone   . Macrodantin   . Prednisone   . Sulfonamide Derivatives     Past Medical History  Diagnosis Date  . MVP (mitral valve prolapse)     and tricuspid leak  . AP (angina pectoris)   . Hiatal hernia   . Hypogammaglobulinemia    borderline  . Pelvic kidney     lower left side  . Sciatica   . Claustrophobia   . Diverticulosis of colon   . HLD (hyperlipidemia)   . Pernicious anemia   . HTN (hypertension)   . OA (osteoarthritis)     Past Surgical History  Procedure Date  . Appendectomy   . Tonsillectomy   . Cholecystectomy   . Right ovary and tube corpus   . Dilation and curettage of uterus   . Breast lumpectomy     Right (fatty tumor)  . Inguinal hernia repair     Left side  . Excision vestibular glands   . Basal cell carcinoma removal     Back  . Intraocular lens insertion     Bilateral  . Film removed left eye   . Total abdominal hysterectomy   . Meniscus repair 08/2009    right    Family History  Problem Relation Age of Onset  . Heart attack Father 6    CABG  . Dementia Mother   . Cancer Maternal Aunt     Breast-multiple aunts and Gm  . Cancer Maternal Uncle     Bladder and lung  . Diabetes Paternal Grandmother   .  Hypertension Father   . Hypertension Brother     History   Social History  . Marital Status: Married    Spouse Name: N/A    Number of Children: 2  . Years of Education: N/A   Occupational History  . Retired-bookkeeping/secretarial work     retired  .      Social History Main Topics  . Smoking status: Never Smoker   . Smokeless tobacco: Not on file  . Alcohol Use: Yes     Regular wine in general  . Drug Use: No  . Sexually Active: Not on file   Other Topics Concern  . Not on file   Social History Narrative   Has living will and advance directives. Requests husband, then daughter to make health care decisions.Would accept resuscitation but no life prolonging technology if cognitively unaware   Review of Systems Stopped aspirin---felt she bruised too easily Still with diarrhea---some better with the colestid. Still has to take the lomotil before going out    Objective:   Physical Exam  Constitutional: She appears well-developed and well-nourished. No  distress.  Neck: Normal range of motion. Neck supple. No thyromegaly present.  Cardiovascular: Normal rate, regular rhythm, normal heart sounds and intact distal pulses.  Exam reveals no gallop.   No murmur heard. Pulmonary/Chest: Effort normal and breath sounds normal. No respiratory distress. She has no wheezes. She has no rales.  Abdominal: Soft. There is no tenderness.  Musculoskeletal: She exhibits no edema and no tenderness.  Lymphadenopathy:    She has no cervical adenopathy.  Psychiatric: She has a normal mood and affect. Her behavior is normal. Judgment and thought content normal.          Assessment & Plan:

## 2011-11-14 NOTE — Assessment & Plan Note (Signed)
Has done well No major symptoms so no rx needed

## 2011-12-04 ENCOUNTER — Ambulatory Visit (INDEPENDENT_AMBULATORY_CARE_PROVIDER_SITE_OTHER): Payer: Medicare Other | Admitting: *Deleted

## 2011-12-04 DIAGNOSIS — E538 Deficiency of other specified B group vitamins: Secondary | ICD-10-CM

## 2011-12-04 MED ORDER — CYANOCOBALAMIN 1000 MCG/ML IJ SOLN
1000.0000 ug | Freq: Once | INTRAMUSCULAR | Status: AC
  Administered 2011-12-04: 1000 ug via INTRAMUSCULAR

## 2011-12-11 ENCOUNTER — Other Ambulatory Visit: Payer: Self-pay | Admitting: *Deleted

## 2011-12-11 MED ORDER — DIPHENOXYLATE-ATROPINE 2.5-0.025 MG PO TABS: 1.0000 | ORAL_TABLET | Freq: Three times a day (TID) | ORAL | Status: DC | PRN

## 2011-12-11 NOTE — Telephone Encounter (Signed)
rx called into pharmacy Spoke with patient and advised results   

## 2011-12-11 NOTE — Telephone Encounter (Signed)
This should be tid prn She should be told to use this as infrequently as possible as it can cause significant side effects  Okay #90 x 0

## 2012-01-09 ENCOUNTER — Ambulatory Visit: Payer: Medicare Other

## 2012-01-10 ENCOUNTER — Ambulatory Visit (INDEPENDENT_AMBULATORY_CARE_PROVIDER_SITE_OTHER): Payer: Medicare Other

## 2012-01-10 DIAGNOSIS — E538 Deficiency of other specified B group vitamins: Secondary | ICD-10-CM | POA: Diagnosis not present

## 2012-01-10 MED ORDER — CYANOCOBALAMIN 1000 MCG/ML IJ SOLN
1000.0000 ug | Freq: Once | INTRAMUSCULAR | Status: AC
  Administered 2012-01-10: 1000 ug via INTRAMUSCULAR

## 2012-01-14 DIAGNOSIS — M5430 Sciatica, unspecified side: Secondary | ICD-10-CM | POA: Diagnosis not present

## 2012-01-14 DIAGNOSIS — IMO0002 Reserved for concepts with insufficient information to code with codable children: Secondary | ICD-10-CM | POA: Diagnosis not present

## 2012-01-14 DIAGNOSIS — M999 Biomechanical lesion, unspecified: Secondary | ICD-10-CM | POA: Diagnosis not present

## 2012-02-11 DIAGNOSIS — IMO0002 Reserved for concepts with insufficient information to code with codable children: Secondary | ICD-10-CM | POA: Diagnosis not present

## 2012-02-11 DIAGNOSIS — M999 Biomechanical lesion, unspecified: Secondary | ICD-10-CM | POA: Diagnosis not present

## 2012-02-11 DIAGNOSIS — M5430 Sciatica, unspecified side: Secondary | ICD-10-CM | POA: Diagnosis not present

## 2012-02-12 ENCOUNTER — Encounter: Payer: Self-pay | Admitting: Cardiovascular Disease

## 2012-02-12 ENCOUNTER — Ambulatory Visit: Payer: Medicare Other | Admitting: Cardiovascular Disease

## 2012-02-12 ENCOUNTER — Ambulatory Visit (INDEPENDENT_AMBULATORY_CARE_PROVIDER_SITE_OTHER): Payer: Medicare Other | Admitting: Cardiovascular Disease

## 2012-02-12 DIAGNOSIS — R079 Chest pain, unspecified: Secondary | ICD-10-CM | POA: Diagnosis not present

## 2012-02-12 DIAGNOSIS — I10 Essential (primary) hypertension: Secondary | ICD-10-CM

## 2012-02-12 DIAGNOSIS — E785 Hyperlipidemia, unspecified: Secondary | ICD-10-CM

## 2012-02-12 DIAGNOSIS — I059 Rheumatic mitral valve disease, unspecified: Secondary | ICD-10-CM | POA: Diagnosis not present

## 2012-02-12 NOTE — Assessment & Plan Note (Signed)
She is only taking colestipol 2 tabs once a day. We have encouraged her to start her exercise, watch her diet.

## 2012-02-12 NOTE — Patient Instructions (Signed)
You are doing well. No medication changes were made.  Please call us if you have new issues that need to be addressed before your next appt.  Your physician wants you to follow-up in: 12 months.  You will receive a reminder letter in the mail two months in advance. If you don't receive a letter, please call our office to schedule the follow-up appointment. 
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
## 2012-02-12 NOTE — Assessment & Plan Note (Signed)
Blood pressure is elevated today. She reports that she has not taken her morning medications. She is also very stressed with the events at home.

## 2012-02-14 ENCOUNTER — Ambulatory Visit (INDEPENDENT_AMBULATORY_CARE_PROVIDER_SITE_OTHER): Payer: Medicare Other | Admitting: *Deleted

## 2012-02-14 DIAGNOSIS — D510 Vitamin B12 deficiency anemia due to intrinsic factor deficiency: Secondary | ICD-10-CM

## 2012-02-14 MED ORDER — CYANOCOBALAMIN 1000 MCG/ML IJ SOLN
1000.0000 ug | Freq: Once | INTRAMUSCULAR | Status: AC
  Administered 2012-02-14: 1000 ug via INTRAMUSCULAR

## 2012-02-21 DIAGNOSIS — L570 Actinic keratosis: Secondary | ICD-10-CM | POA: Diagnosis not present

## 2012-02-21 DIAGNOSIS — L819 Disorder of pigmentation, unspecified: Secondary | ICD-10-CM | POA: Diagnosis not present

## 2012-02-22 ENCOUNTER — Encounter: Payer: Self-pay | Admitting: Internal Medicine

## 2012-02-22 ENCOUNTER — Ambulatory Visit (INDEPENDENT_AMBULATORY_CARE_PROVIDER_SITE_OTHER): Payer: Medicare Other | Admitting: Internal Medicine

## 2012-02-22 DIAGNOSIS — I10 Essential (primary) hypertension: Secondary | ICD-10-CM | POA: Diagnosis not present

## 2012-02-22 DIAGNOSIS — Z733 Stress, not elsewhere classified: Secondary | ICD-10-CM | POA: Diagnosis not present

## 2012-02-22 DIAGNOSIS — F439 Reaction to severe stress, unspecified: Secondary | ICD-10-CM | POA: Insufficient documentation
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
## 2012-02-22 NOTE — Assessment & Plan Note (Signed)
BP Readings from Last 3 Encounters:  02/22/12 140/70  02/12/12 160/100  11/14/11 156/64   Repeat by me after talking about her husband was 166/60 No changes for now
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
## 2012-03-10 DIAGNOSIS — M999 Biomechanical lesion, unspecified: Secondary | ICD-10-CM | POA: Diagnosis not present

## 2012-03-10 DIAGNOSIS — IMO0002 Reserved for concepts with insufficient information to code with codable children: Secondary | ICD-10-CM | POA: Diagnosis not present

## 2012-03-10 DIAGNOSIS — M5430 Sciatica, unspecified side: Secondary | ICD-10-CM | POA: Diagnosis not present

## 2012-03-18 DIAGNOSIS — H04129 Dry eye syndrome of unspecified lacrimal gland: Secondary | ICD-10-CM | POA: Diagnosis not present

## 2012-03-20 ENCOUNTER — Ambulatory Visit (INDEPENDENT_AMBULATORY_CARE_PROVIDER_SITE_OTHER): Payer: Medicare Other | Admitting: *Deleted

## 2012-03-20 DIAGNOSIS — D510 Vitamin B12 deficiency anemia due to intrinsic factor deficiency: Secondary | ICD-10-CM | POA: Diagnosis not present

## 2012-03-20 MED ORDER — CYANOCOBALAMIN 1000 MCG/ML IJ SOLN
1000.0000 ug | Freq: Once | INTRAMUSCULAR | Status: AC
  Administered 2012-03-20: 1000 ug via INTRAMUSCULAR

## 2012-03-25 ENCOUNTER — Other Ambulatory Visit: Payer: Self-pay | Admitting: *Deleted

## 2012-03-25 MED ORDER — DIPHENOXYLATE-ATROPINE 2.5-0.025 MG PO TABS: 1.0000 | ORAL_TABLET | Freq: Three times a day (TID) | ORAL | Status: DC | PRN

## 2012-03-25 NOTE — Telephone Encounter (Signed)
Received request from pharmacy. Is it okay to refill medication?

## 2012-03-25 NOTE — Telephone Encounter (Signed)
Okay #90 x 0 

## 2012-03-25 NOTE — Telephone Encounter (Signed)
rx called into pharmacy

## 2012-04-14 DIAGNOSIS — M5430 Sciatica, unspecified side: Secondary | ICD-10-CM | POA: Diagnosis not present

## 2012-04-14 DIAGNOSIS — IMO0002 Reserved for concepts with insufficient information to code with codable children: Secondary | ICD-10-CM | POA: Diagnosis not present

## 2012-04-14 DIAGNOSIS — M999 Biomechanical lesion, unspecified: Secondary | ICD-10-CM | POA: Diagnosis not present

## 2012-04-21 DIAGNOSIS — L570 Actinic keratosis: Secondary | ICD-10-CM | POA: Diagnosis not present

## 2012-04-21 DIAGNOSIS — L908 Other atrophic disorders of skin: Secondary | ICD-10-CM | POA: Diagnosis not present

## 2012-04-21 DIAGNOSIS — L918 Other hypertrophic disorders of the skin: Secondary | ICD-10-CM | POA: Diagnosis not present

## 2012-04-24 ENCOUNTER — Ambulatory Visit (INDEPENDENT_AMBULATORY_CARE_PROVIDER_SITE_OTHER): Payer: Medicare Other | Admitting: *Deleted

## 2012-04-24 DIAGNOSIS — D510 Vitamin B12 deficiency anemia due to intrinsic factor deficiency: Secondary | ICD-10-CM

## 2012-04-24 MED ORDER — CYANOCOBALAMIN 1000 MCG/ML IJ SOLN
1000.0000 ug | Freq: Once | INTRAMUSCULAR | Status: AC
  Administered 2012-04-24: 1000 ug via INTRAMUSCULAR

## 2012-05-12 DIAGNOSIS — IMO0002 Reserved for concepts with insufficient information to code with codable children: Secondary | ICD-10-CM | POA: Diagnosis not present

## 2012-05-12 DIAGNOSIS — M5430 Sciatica, unspecified side: Secondary | ICD-10-CM | POA: Diagnosis not present

## 2012-05-12 DIAGNOSIS — M999 Biomechanical lesion, unspecified: Secondary | ICD-10-CM | POA: Diagnosis not present

## 2012-05-14 ENCOUNTER — Ambulatory Visit (INDEPENDENT_AMBULATORY_CARE_PROVIDER_SITE_OTHER): Payer: Medicare Other | Admitting: Internal Medicine

## 2012-05-14 ENCOUNTER — Encounter: Payer: Self-pay | Admitting: Internal Medicine

## 2012-05-14 VITALS — BP 110/60 | HR 62 | Temp 97.9°F | Wt 159.0 lb

## 2012-05-14 DIAGNOSIS — E785 Hyperlipidemia, unspecified: Secondary | ICD-10-CM | POA: Diagnosis not present

## 2012-05-14 DIAGNOSIS — M1990 Unspecified osteoarthritis, unspecified site: Secondary | ICD-10-CM

## 2012-05-14 DIAGNOSIS — R197 Diarrhea, unspecified: Secondary | ICD-10-CM

## 2012-05-14 DIAGNOSIS — Z733 Stress, not elsewhere classified: Secondary | ICD-10-CM

## 2012-05-14 DIAGNOSIS — F439 Reaction to severe stress, unspecified: Secondary | ICD-10-CM

## 2012-05-14 DIAGNOSIS — I10 Essential (primary) hypertension: Secondary | ICD-10-CM

## 2012-05-14 DIAGNOSIS — L570 Actinic keratosis: Secondary | ICD-10-CM | POA: Insufficient documentation

## 2012-05-14 LAB — LIPID PANEL
Cholesterol: 206 mg/dL — ABNORMAL HIGH (ref 0–200)
HDL: 94.7 mg/dL (ref >39.00)
Total CHOL/HDL Ratio: 2
Triglycerides: 70 mg/dL (ref 0.0–149.0)
VLDL: 14 mg/dL (ref 0.0–40.0)

## 2012-05-14 LAB — CBC WITH DIFFERENTIAL/PLATELET
Basophils Absolute: 0.1 10*3/uL (ref 0.0–0.1)
Basophils Relative: 1.1 % (ref 0.0–3.0)
Eosinophils Absolute: 0.1 10*3/uL (ref 0.0–0.7)
Hemoglobin: 13 g/dL (ref 12.0–15.0)
Lymphocytes Relative: 18.9 % (ref 12.0–46.0)
MCHC: 33.3 g/dL (ref 30.0–36.0)
Monocytes Relative: 12.5 % — ABNORMAL HIGH (ref 3.0–12.0)
Neutro Abs: 3.2 10*3/uL (ref 1.4–7.7)
Neutrophils Relative %: 65.4 % (ref 43.0–77.0)
RBC: 4 Mil/uL (ref 3.87–5.11)

## 2012-05-14 LAB — BASIC METABOLIC PANEL
CO2: 28 mEq/L (ref 19–32)
Calcium: 9.2 mg/dL (ref 8.4–10.5)
Creatinine, Ser: 0.8 mg/dL (ref 0.4–1.2)
GFR: 71.95 mL/min (ref >60.00)
Sodium: 139 mEq/L (ref 135–145)

## 2012-05-14 LAB — HEPATIC FUNCTION PANEL
ALT: 33 U/L (ref 0–35)
Alkaline Phosphatase: 47 U/L (ref 39–117)
Bilirubin, Direct: 0 mg/dL (ref 0.0–0.3)
Total Bilirubin: 0.5 mg/dL (ref 0.3–1.2)

## 2012-05-14 NOTE — Assessment & Plan Note (Signed)
Mild in DIPs No meds needed

## 2012-05-14 NOTE — Assessment & Plan Note (Signed)
Lesion on forearm treated with liquid nitrogen 45 seconds x 2

## 2012-05-14 NOTE — Assessment & Plan Note (Signed)
Worse off the cholestipol Will try it again

## 2012-05-14 NOTE — Patient Instructions (Signed)
Please try loratadine 10mg  -- 1-2 daily for nasal congestion and allergies

## 2012-05-14 NOTE — Assessment & Plan Note (Signed)
Just on the fish oil Very high HDL so overall low risk
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
## 2012-05-14 NOTE — Assessment & Plan Note (Signed)
BP Readings from Last 3 Encounters:  05/14/12 110/60  02/22/12 140/70  02/12/12 160/100   Good control now No changes needed Due for labs

## 2012-05-14 NOTE — Assessment & Plan Note (Signed)
Improved now Doing better with her husband

## 2012-05-19 ENCOUNTER — Encounter: Payer: Self-pay | Admitting: *Deleted

## 2012-05-27 ENCOUNTER — Ambulatory Visit (INDEPENDENT_AMBULATORY_CARE_PROVIDER_SITE_OTHER): Payer: Medicare Other | Admitting: *Deleted

## 2012-05-27 DIAGNOSIS — D510 Vitamin B12 deficiency anemia due to intrinsic factor deficiency: Secondary | ICD-10-CM

## 2012-05-27 MED ORDER — CYANOCOBALAMIN 1000 MCG/ML IJ SOLN
1000.0000 ug | Freq: Once | INTRAMUSCULAR | Status: AC
  Administered 2012-05-27: 1000 ug via INTRAMUSCULAR

## 2012-06-04 DIAGNOSIS — N764 Abscess of vulva: Secondary | ICD-10-CM | POA: Diagnosis not present

## 2012-06-10 DIAGNOSIS — IMO0002 Reserved for concepts with insufficient information to code with codable children: Secondary | ICD-10-CM | POA: Diagnosis not present

## 2012-06-10 DIAGNOSIS — M5430 Sciatica, unspecified side: Secondary | ICD-10-CM | POA: Diagnosis not present

## 2012-06-10 DIAGNOSIS — M999 Biomechanical lesion, unspecified: Secondary | ICD-10-CM | POA: Diagnosis not present

## 2012-06-13 ENCOUNTER — Other Ambulatory Visit: Payer: Self-pay | Admitting: Cardiovascular Disease

## 2012-06-13 ENCOUNTER — Telehealth: Payer: Self-pay | Admitting: Cardiovascular Disease

## 2012-06-13 NOTE — Telephone Encounter (Signed)
Patient called and last week she had a couple glasses of wine and took a nap when she woke up she was dizzy, bowels were loose, and ears ringing.  Wondering if she might have had mini stroke, would like for RN to call her back at [phone]

## 2012-06-13 NOTE — Telephone Encounter (Signed)
Pt says she has been having some chest tightness, numbness in hands and ringing in ears.  She says chest tightness is no worse than usual but feels this is happening more frequently.  She confirms compliance with meds.  SBP is 156-167.  I discussed with Dr. Mariah Milling who asks that we reassure pt since she had nml heart  Cath in feb.  Chest pain likely non cardiac.  Asks that pt monitor BP daily and keep a log of this.    Pt informed. Understanding verb.  Will call us with report.

## 2012-06-16 ENCOUNTER — Telehealth: Payer: Self-pay

## 2012-06-16 MED ORDER — NITROGLYCERIN 0.4 MG/SPRAY TL SOLN: 1.0000 | Status: DC | PRN

## 2012-06-16 NOTE — Telephone Encounter (Signed)
Refill sent for NTG spary.

## 2012-06-17 DIAGNOSIS — IMO0002 Reserved for concepts with insufficient information to code with codable children: Secondary | ICD-10-CM | POA: Diagnosis not present

## 2012-06-17 DIAGNOSIS — M5430 Sciatica, unspecified side: Secondary | ICD-10-CM | POA: Diagnosis not present

## 2012-06-17 DIAGNOSIS — M999 Biomechanical lesion, unspecified: Secondary | ICD-10-CM | POA: Diagnosis not present

## 2012-06-23 ENCOUNTER — Other Ambulatory Visit: Payer: Self-pay | Admitting: *Deleted

## 2012-06-23 MED ORDER — EPINEPHRINE 0.3 MG/0.3ML IJ DEVI: 0.3000 mg | Freq: Once | INTRAMUSCULAR | Status: DC

## 2012-07-02 ENCOUNTER — Ambulatory Visit: Payer: Self-pay | Admitting: Obstetrics and Gynecology

## 2012-07-02 DIAGNOSIS — Z1231 Encounter for screening mammogram for malignant neoplasm of breast: Secondary | ICD-10-CM | POA: Diagnosis not present

## 2012-07-04 ENCOUNTER — Ambulatory Visit (INDEPENDENT_AMBULATORY_CARE_PROVIDER_SITE_OTHER): Payer: Medicare Other

## 2012-07-04 DIAGNOSIS — E538 Deficiency of other specified B group vitamins: Secondary | ICD-10-CM

## 2012-07-04 MED ORDER — CYANOCOBALAMIN 1000 MCG/ML IJ SOLN
1000.0000 ug | Freq: Once | INTRAMUSCULAR | Status: AC
  Administered 2012-07-04: 1000 ug via INTRAMUSCULAR

## 2012-07-07 ENCOUNTER — Other Ambulatory Visit: Payer: Self-pay | Admitting: *Deleted

## 2012-07-07 NOTE — Telephone Encounter (Signed)
Okay #90 x 0 

## 2012-07-08 MED ORDER — DIPHENOXYLATE-ATROPINE 2.5-0.025 MG PO TABS: 1.0000 | ORAL_TABLET | Freq: Three times a day (TID) | ORAL | Status: DC | PRN

## 2012-07-08 NOTE — Telephone Encounter (Signed)
rx called into pharmacy

## 2012-07-09 ENCOUNTER — Telehealth: Payer: Self-pay

## 2012-07-09 NOTE — Telephone Encounter (Signed)
Lomotil was called to [facility] instead of [facility]. Pt wants [facility] removed from her list of pharmacies. I apologized to pt and offered to have transferred to [facility]. Pt has already picked up from [facility]; just wants [facility] off her chart. I removed [facility] from pts med list.

## 2012-07-15 DIAGNOSIS — M999 Biomechanical lesion, unspecified: Secondary | ICD-10-CM | POA: Diagnosis not present

## 2012-07-15 DIAGNOSIS — IMO0002 Reserved for concepts with insufficient information to code with codable children: Secondary | ICD-10-CM | POA: Diagnosis not present

## 2012-07-15 DIAGNOSIS — M5430 Sciatica, unspecified side: Secondary | ICD-10-CM | POA: Diagnosis not present

## 2012-08-08 ENCOUNTER — Ambulatory Visit (INDEPENDENT_AMBULATORY_CARE_PROVIDER_SITE_OTHER): Payer: Medicare Other | Admitting: *Deleted

## 2012-08-08 DIAGNOSIS — E538 Deficiency of other specified B group vitamins: Secondary | ICD-10-CM

## 2012-08-08 MED ORDER — CYANOCOBALAMIN 1000 MCG/ML IJ SOLN
1000.0000 ug | Freq: Once | INTRAMUSCULAR | Status: AC
  Administered 2012-08-08: 1000 ug via INTRAMUSCULAR

## 2012-08-11 DIAGNOSIS — M999 Biomechanical lesion, unspecified: Secondary | ICD-10-CM | POA: Diagnosis not present

## 2012-08-11 DIAGNOSIS — IMO0002 Reserved for concepts with insufficient information to code with codable children: Secondary | ICD-10-CM | POA: Diagnosis not present

## 2012-08-11 DIAGNOSIS — M5430 Sciatica, unspecified side: Secondary | ICD-10-CM | POA: Diagnosis not present

## 2012-08-18 DIAGNOSIS — M1710 Unilateral primary osteoarthritis, unspecified knee: Secondary | ICD-10-CM | POA: Diagnosis not present

## 2012-08-18 DIAGNOSIS — M19039 Primary osteoarthritis, unspecified wrist: Secondary | ICD-10-CM | POA: Diagnosis not present

## 2012-08-22 DIAGNOSIS — M999 Biomechanical lesion, unspecified: Secondary | ICD-10-CM | POA: Diagnosis not present

## 2012-08-22 DIAGNOSIS — IMO0002 Reserved for concepts with insufficient information to code with codable children: Secondary | ICD-10-CM | POA: Diagnosis not present

## 2012-08-22 DIAGNOSIS — M5430 Sciatica, unspecified side: Secondary | ICD-10-CM | POA: Diagnosis not present

## 2012-08-25 DIAGNOSIS — M999 Biomechanical lesion, unspecified: Secondary | ICD-10-CM | POA: Diagnosis not present

## 2012-08-25 DIAGNOSIS — IMO0002 Reserved for concepts with insufficient information to code with codable children: Secondary | ICD-10-CM | POA: Diagnosis not present

## 2012-08-25 DIAGNOSIS — M5430 Sciatica, unspecified side: Secondary | ICD-10-CM | POA: Diagnosis not present

## 2012-08-28 ENCOUNTER — Encounter: Payer: Self-pay | Admitting: Family Medicine

## 2012-08-28 ENCOUNTER — Ambulatory Visit (INDEPENDENT_AMBULATORY_CARE_PROVIDER_SITE_OTHER): Payer: Medicare Other | Admitting: Family Medicine

## 2012-08-28 VITALS — BP 118/68 | HR 60 | Temp 98.1°F | Wt 155.8 lb

## 2012-08-28 DIAGNOSIS — M5430 Sciatica, unspecified side: Secondary | ICD-10-CM | POA: Diagnosis not present

## 2012-08-28 DIAGNOSIS — M2550 Pain in unspecified joint: Secondary | ICD-10-CM | POA: Insufficient documentation

## 2012-08-28 DIAGNOSIS — E785 Hyperlipidemia, unspecified: Secondary | ICD-10-CM | POA: Diagnosis not present

## 2012-08-28 DIAGNOSIS — M130 Polyarthritis, unspecified: Secondary | ICD-10-CM | POA: Diagnosis not present

## 2012-08-28 DIAGNOSIS — IMO0002 Reserved for concepts with insufficient information to code with codable children: Secondary | ICD-10-CM | POA: Diagnosis not present

## 2012-08-28 DIAGNOSIS — M999 Biomechanical lesion, unspecified: Secondary | ICD-10-CM | POA: Diagnosis not present

## 2012-08-28 NOTE — Assessment & Plan Note (Addendum)
Symmetrical polyarthritis of MCPs and some PIPs.  H/o OA but not consistent with this. Present for last 1 week. Will start with blood work to evaluate for RA, gout. Pt declines prescription naprosyn for now. Less likely cellulitis - check CBC

## 2012-08-28 NOTE — Patient Instructions (Addendum)
I'm worried about rheumatoid arthritis.   We will draw blood work for this today. Let us know how you're doing.

## 2012-08-28 NOTE — Progress Notes (Signed)
  Subjective:    Patient ID: Mallory Rodriguez, female    DOB: 1933-08-12, 78 y.o.   MRN: [id number]  HPI CC: bilateral hand pain  Sees chiropractor and orthopedist.  Last saw chiro this morning.  Recently orthopedist told her she had hand arthritis, had steroid injection into wrist which helped.  Had neck adjustment 08/11/2012 - since then, noticing worsening hand swelling.  Cleaned venetian blinds with vinegar water.  This morning R>L but currently L>R swelling and redness/warmth.  Denies injury/trauma to hands or skin break. No h/o gout. ++ h/o OA. H/o DDD but unsure where.  Points to MCPs of bilateral hands first 2 worse, as well as PIPs bilaterally.  No wrist pain.  Pain worse at night and in morning.  Stiff joints, trouble moving them. Also with R shoulder pain.  Prednisone causes headache.  Has not had in a long time. Taking aleve bid.  As well as icing throughout the day.  Denies arthralgias of lower extremities.  Denies fevers/chills, new rashes, muscle aches, abd pain, HA.  No dactylitis.  Past Medical History  Diagnosis Date  . MVP (mitral valve prolapse)     and tricuspid leak  . AP (angina pectoris)   . Hiatal hernia   . Hypogammaglobulinemia     borderline  . Pelvic kidney     lower left side  . Sciatica   . Claustrophobia   . Diverticulosis of colon   . HLD (hyperlipidemia)   . Pernicious anemia   . HTN (hypertension)   . OA (osteoarthritis)     Past Surgical History  Procedure Date  . Appendectomy   . Tonsillectomy   . Cholecystectomy   . Right ovary and tube corpus   . Dilation and curettage of uterus   . Breast lumpectomy     Right (fatty tumor)  . Inguinal hernia repair     Left side  . Excision vestibular glands   . Basal cell carcinoma removal     Back  . Intraocular lens insertion     Bilateral  . Film removed left eye   . Total abdominal hysterectomy   . Meniscus repair 08/2009    right    Review of Systems Per HPI    Objective:   Physical Exam  Nursing note and vitals reviewed. Constitutional: She appears well-developed and well-nourished. No distress.  Musculoskeletal: She exhibits no edema.       No wrist pain.  FROM at wrists. Tenderness to palpation bilateral MCPs (mainly left 2nd and right 4th MCPs), as well as erythema and calor at these MCPs, L>R. No dactylitis. No pain to palpation DIP/PIP.   Neurological: She is alert.       Grip strength intact Sensation intact  Skin: Skin is warm and dry. No rash noted. There is erythema.  Psychiatric: She has a normal mood and affect.       Assessment & Plan:

## 2012-08-29 LAB — CBC WITH DIFFERENTIAL/PLATELET
Basophils Relative: 1.2 % (ref 0.0–3.0)
Eosinophils Relative: 5.9 % — ABNORMAL HIGH (ref 0.0–5.0)
HCT: 37.8 % (ref 36.0–46.0)
Hemoglobin: 12.3 g/dL (ref 12.0–15.0)
MCV: 95.8 fl (ref 78.0–100.0)
Monocytes Absolute: 0.8 10*3/uL (ref 0.1–1.0)
Neutro Abs: 3.9 10*3/uL (ref 1.4–7.7)
Neutrophils Relative %: 61 % (ref 43.0–77.0)
RBC: 3.95 Mil/uL (ref 3.87–5.11)
WBC: 6.5 10*3/uL (ref 4.5–10.5)

## 2012-08-29 LAB — URIC ACID: Uric Acid, Serum: 6.1 mg/dL (ref 2.4–7.0)

## 2012-08-29 LAB — SEDIMENTATION RATE: Sed Rate: 33 mm/hr — ABNORMAL HIGH (ref 0–22)

## 2012-09-03 DIAGNOSIS — M999 Biomechanical lesion, unspecified: Secondary | ICD-10-CM | POA: Diagnosis not present

## 2012-09-03 DIAGNOSIS — M5430 Sciatica, unspecified side: Secondary | ICD-10-CM | POA: Diagnosis not present

## 2012-09-03 DIAGNOSIS — IMO0002 Reserved for concepts with insufficient information to code with codable children: Secondary | ICD-10-CM | POA: Diagnosis not present

## 2012-09-04 ENCOUNTER — Other Ambulatory Visit: Payer: Self-pay | Admitting: Family Medicine

## 2012-09-04 MED ORDER — NAPROXEN 500 MG PO TABS: ORAL_TABLET | ORAL | Status: DC

## 2012-09-12 ENCOUNTER — Ambulatory Visit (INDEPENDENT_AMBULATORY_CARE_PROVIDER_SITE_OTHER): Payer: Medicare Other | Admitting: *Deleted

## 2012-09-12 DIAGNOSIS — Z23 Encounter for immunization: Secondary | ICD-10-CM | POA: Diagnosis not present

## 2012-09-12 DIAGNOSIS — E538 Deficiency of other specified B group vitamins: Secondary | ICD-10-CM | POA: Diagnosis not present

## 2012-09-12 MED ORDER — CYANOCOBALAMIN 1000 MCG/ML IJ SOLN
1000.0000 ug | Freq: Once | INTRAMUSCULAR | Status: AC
  Administered 2012-09-12: 1000 ug via INTRAMUSCULAR

## 2012-09-18 DIAGNOSIS — IMO0002 Reserved for concepts with insufficient information to code with codable children: Secondary | ICD-10-CM | POA: Diagnosis not present

## 2012-09-18 DIAGNOSIS — M999 Biomechanical lesion, unspecified: Secondary | ICD-10-CM | POA: Diagnosis not present

## 2012-09-18 DIAGNOSIS — M5430 Sciatica, unspecified side: Secondary | ICD-10-CM | POA: Diagnosis not present

## 2012-09-29 DIAGNOSIS — M5430 Sciatica, unspecified side: Secondary | ICD-10-CM | POA: Diagnosis not present

## 2012-09-29 DIAGNOSIS — M999 Biomechanical lesion, unspecified: Secondary | ICD-10-CM | POA: Diagnosis not present

## 2012-09-29 DIAGNOSIS — IMO0002 Reserved for concepts with insufficient information to code with codable children: Secondary | ICD-10-CM | POA: Diagnosis not present

## 2012-10-07 ENCOUNTER — Other Ambulatory Visit: Payer: Self-pay | Admitting: Internal Medicine

## 2012-10-07 NOTE — Telephone Encounter (Signed)
Okay #90 x 0 

## 2012-10-07 NOTE — Telephone Encounter (Signed)
rx called into pharmacy

## 2012-10-14 ENCOUNTER — Other Ambulatory Visit: Payer: Self-pay | Admitting: *Deleted

## 2012-10-14 DIAGNOSIS — M130 Polyarthritis, unspecified: Secondary | ICD-10-CM

## 2012-10-14 MED ORDER — COLESTIPOL HCL 1 G PO TABS: 2.0000 g | ORAL_TABLET | Freq: Every day | ORAL | Status: DC

## 2012-10-16 ENCOUNTER — Ambulatory Visit: Payer: Medicare Other

## 2012-10-17 ENCOUNTER — Ambulatory Visit: Payer: Medicare Other

## 2012-10-20 DIAGNOSIS — M5430 Sciatica, unspecified side: Secondary | ICD-10-CM | POA: Diagnosis not present

## 2012-10-20 DIAGNOSIS — IMO0002 Reserved for concepts with insufficient information to code with codable children: Secondary | ICD-10-CM | POA: Diagnosis not present

## 2012-10-20 DIAGNOSIS — M999 Biomechanical lesion, unspecified: Secondary | ICD-10-CM | POA: Diagnosis not present

## 2012-10-23 ENCOUNTER — Ambulatory Visit (INDEPENDENT_AMBULATORY_CARE_PROVIDER_SITE_OTHER): Payer: Medicare Other

## 2012-10-23 DIAGNOSIS — E538 Deficiency of other specified B group vitamins: Secondary | ICD-10-CM | POA: Diagnosis not present

## 2012-10-23 MED ORDER — CYANOCOBALAMIN 1000 MCG/ML IJ SOLN
1000.0000 ug | Freq: Once | INTRAMUSCULAR | Status: AC
  Administered 2012-10-23: 1000 ug via INTRAMUSCULAR

## 2012-11-17 DIAGNOSIS — M999 Biomechanical lesion, unspecified: Secondary | ICD-10-CM | POA: Diagnosis not present

## 2012-11-17 DIAGNOSIS — IMO0002 Reserved for concepts with insufficient information to code with codable children: Secondary | ICD-10-CM | POA: Diagnosis not present

## 2012-11-17 DIAGNOSIS — M5430 Sciatica, unspecified side: Secondary | ICD-10-CM | POA: Diagnosis not present

## 2012-11-18 ENCOUNTER — Ambulatory Visit (INDEPENDENT_AMBULATORY_CARE_PROVIDER_SITE_OTHER): Payer: Medicare Other | Admitting: Internal Medicine

## 2012-11-18 ENCOUNTER — Encounter: Payer: Self-pay | Admitting: Internal Medicine

## 2012-11-18 VITALS — BP 138/70 | HR 64 | Temp 98.2°F | Ht 62.0 in | Wt 163.0 lb

## 2012-11-18 DIAGNOSIS — M159 Polyosteoarthritis, unspecified: Secondary | ICD-10-CM

## 2012-11-18 DIAGNOSIS — I10 Essential (primary) hypertension: Secondary | ICD-10-CM

## 2012-11-18 DIAGNOSIS — R197 Diarrhea, unspecified: Secondary | ICD-10-CM

## 2012-11-18 DIAGNOSIS — Z0000 Encounter for general adult medical examination without abnormal findings: Secondary | ICD-10-CM

## 2012-11-18 NOTE — Patient Instructions (Signed)
Please set up hearing evaluation at [facility]

## 2012-11-18 NOTE — Assessment & Plan Note (Signed)
Does okay on colestid and occ extra med Told her to try the imodium and stay away from the lomotil

## 2012-11-18 NOTE — Assessment & Plan Note (Signed)
I have personally reviewed the Medicare Annual Wellness questionnaire and have noted 1. The patient's medical and social history 2. Their use of alcohol, tobacco or illicit drugs 3. Their current medications and supplements 4. The patient's functional ability including ADL's, fall risks, home safety risks and hearing or visual             impairment. 5. Diet and physical activities 6. Evidence for depression or mood disorders  The patients weight, height, BMI and visual acuity have been recorded in the chart I have made referrals, counseling and provided education to the patient based review of the above and I have provided the pt with a written personalized care plan for preventive services.  I have provided you with a copy of your personalized plan for preventive services. Please take the time to review along with your updated medication list.  No acute issues

## 2012-11-18 NOTE — Assessment & Plan Note (Signed)
BP Readings from Last 3 Encounters:  11/18/12 138/70  08/28/12 118/68  05/14/12 110/60   Good control No changes needed Lab Results  Component Value Date   CREATININE 0.8 05/14/2012

## 2012-11-18 NOTE — Progress Notes (Signed)
Subjective:    Patient ID: Mallory Rodriguez, female    DOB: 1933-09-02, 78 y.o.   MRN: [id number]  HPI Here for wellness visit and follow up Reviewed other doctors she sees No depression or anhedonia 1 fall--she tripped. No injury Vision is fine Hearing problems---doesn't always wear hearing aide Doesn't exercise--but does try to play with grandson Independent with ADLs and instrumental ADLs UTD on cancer screening and immunizations  Still has swelling in fingers May have damaged them with helping daughter move Still sees chiropractor Hands got better after her neck improved Still with mild joint swelling but not much pain (other than left 3rd PIP) Does use aleve occasionally  No chest pain No SOB--unless she "overdoes it" No palpitations Chronic ankle edema--no change  On the colestipol Seems to keep her bowels steady if she uses it regularly occ needs the lomotil (but noted blurred vision) Uses imodium instead Current Outpatient Prescriptions on File Prior to Visit  Medication Sig Dispense Refill  . Calcium-Vitamin D-Vitamin K (VIACTIV) 500-500-40 MG-UNT-MCG CHEW Chew 1 tablet by mouth daily.        . colestipol (COLESTID) 1 G tablet Take 2 tablets (2 g total) by mouth daily.  60 tablet  11  . Cyanocobalamin (VITAMIN B-12 IJ) Inject as directed every 5 (five) weeks.      . diphenoxylate-atropine (LOMOTIL) 2.5-0.025 MG per tablet TAKE 1 TABLET 3 TIMES A DAY AS NEEDED --MAX 8 TABLETS PER DAY  90 tablet  0  . EPINEPHrine (EPI-PEN) 0.3 mg/0.3 mL DEVI Inject 0.3 mLs (0.3 mg total) into the muscle once.  1 Device  1  . fish oil-omega-3 fatty acids 1000 MG capsule Take 1 g by mouth daily.        . hydrochlorothiazide (HYDRODIURIL) 25 MG tablet TAKE ONE TABLET BY MOUTH DAILY.  30 tablet  10  . Multiple Vitamins-Minerals (CENTRUM SILVER PO) Take 1 tablet by mouth daily.      . naproxen sodium (ANAPROX) 220 MG tablet Take 220 mg by mouth 2 (two) times daily with a meal.      .  nitroGLYCERIN (NITROLINGUAL) 0.4 MG/SPRAY spray Place 1 spray under the tongue every 5 (five) minutes as needed for chest pain.  4.9 g  1  . PATADAY 0.2 % SOLN       . POTASSIUM GLUCONATE PO Take 1 tablet by mouth daily.        Allergies  Allergen Reactions  . Ace Inhibitors   . Contrast Media (Iodinated Diagnostic Agents)   . Epinephrine   . Hydrocodone   . Macrodantin   . Prednisone   . Sulfonamide Derivatives     Past Medical History  Diagnosis Date  . MVP (mitral valve prolapse)     and tricuspid leak  . AP (angina pectoris)   . Hiatal hernia   . Hypogammaglobulinemia     borderline  . Pelvic kidney     lower left side  . Sciatica   . Claustrophobia   . Diverticulosis of colon   . HLD (hyperlipidemia)   . Pernicious anemia   . HTN (hypertension)   . OA (osteoarthritis)     Past Surgical History  Procedure Date  . Appendectomy   . Tonsillectomy   . Cholecystectomy   . Right ovary and tube corpus   . Dilation and curettage of uterus   . Breast lumpectomy     Right (fatty tumor)  . Inguinal hernia repair     Left side  . Excision  vestibular glands   . Basal cell carcinoma removal     Back  . Intraocular lens insertion     Bilateral  . Film removed left eye   . Total abdominal hysterectomy   . Meniscus repair 08/2009    right    Family History  Problem Relation Age of Onset  . Heart attack Father 2    CABG  . Dementia Mother   . Cancer Maternal Aunt     Breast-multiple aunts and Gm  . Cancer Maternal Uncle     Bladder and lung  . Diabetes Paternal Grandmother   . Hypertension Father   . Hypertension Brother     History   Social History  . Marital Status: Married    Spouse Name: N/A    Number of Children: 2  . Years of Education: N/A   Occupational History  . Retired-bookkeeping/secretarial work     retired  .      Social History Main Topics  . Smoking status: Never Smoker   . Smokeless tobacco: Never Used  . Alcohol Use: Yes      Comment: Regular wine in general  . Drug Use: No  . Sexually Active: Not on file   Other Topics Concern  . Not on file   Social History Narrative   Has living will and advance directives. Requests husband, then daughter to make health care decisions.Would accept resuscitation but no life prolonging technology if cognitively unaware   Review of Systems Sleeps okay in general Weight is up 8# No blood in stool    Objective:   Physical Exam  Constitutional: She is oriented to person, place, and time. She appears well-developed and well-nourished. No distress.  Neck: Normal range of motion. Neck supple. No thyromegaly present.  Cardiovascular: Normal rate, regular rhythm, normal heart sounds and intact distal pulses.  Exam reveals no gallop.   No murmur heard. Pulmonary/Chest: Effort normal and breath sounds normal. No respiratory distress. She has no wheezes. She has no rales.  Abdominal: Soft. There is no tenderness.  Musculoskeletal: She exhibits no edema and no tenderness.  Lymphadenopathy:    She has no cervical adenopathy.  Neurological: She is alert and oriented to person, place, and time.       President-- "[employer], Felix Pacini, [place]" [id number] ("I am terrible with numbers) D-l-r-o-w Recall 3/3  Psychiatric: She has a normal mood and affect. Her behavior is normal.          Assessment & Plan:

## 2012-11-18 NOTE — Assessment & Plan Note (Signed)
Hands are better now Okay with occ aleve

## 2012-11-28 ENCOUNTER — Encounter: Payer: Self-pay | Admitting: Family Medicine

## 2012-11-28 ENCOUNTER — Ambulatory Visit: Payer: Medicare Other

## 2012-11-28 ENCOUNTER — Ambulatory Visit (INDEPENDENT_AMBULATORY_CARE_PROVIDER_SITE_OTHER): Payer: Medicare Other | Admitting: Family Medicine

## 2012-11-28 ENCOUNTER — Emergency Department: Payer: Self-pay | Admitting: Emergency Medicine

## 2012-11-28 VITALS — BP 132/74 | HR 96 | Temp 97.8°F | Wt 159.8 lb

## 2012-11-28 DIAGNOSIS — Z79899 Other long term (current) drug therapy: Secondary | ICD-10-CM | POA: Diagnosis not present

## 2012-11-28 DIAGNOSIS — M1710 Unilateral primary osteoarthritis, unspecified knee: Secondary | ICD-10-CM

## 2012-11-28 DIAGNOSIS — Z9079 Acquired absence of other genital organ(s): Secondary | ICD-10-CM | POA: Diagnosis not present

## 2012-11-28 DIAGNOSIS — E538 Deficiency of other specified B group vitamins: Secondary | ICD-10-CM | POA: Diagnosis not present

## 2012-11-28 DIAGNOSIS — Z9889 Other specified postprocedural states: Secondary | ICD-10-CM | POA: Diagnosis not present

## 2012-11-28 DIAGNOSIS — Z9089 Acquired absence of other organs: Secondary | ICD-10-CM | POA: Diagnosis not present

## 2012-11-28 DIAGNOSIS — M25569 Pain in unspecified knee: Secondary | ICD-10-CM | POA: Diagnosis not present

## 2012-11-28 MED ORDER — CYANOCOBALAMIN 1000 MCG/ML IJ SOLN
1000.0000 ug | Freq: Once | INTRAMUSCULAR | Status: AC
  Administered 2012-11-28: 1000 ug via INTRAMUSCULAR

## 2012-11-28 NOTE — Progress Notes (Signed)
Nature conservation officer at [facility] [address] [place] [address] Phone: [phone] Fax: [phone]  Date:  11/28/2012   Name:  Mallory Rodriguez   DOB:  01/08/1933   MRN:  [id number] Gender: female Age: 78 y.o.  PCP:  Tillman Abide, MD  Evaluating MD: Hannah Beat, MD   Chief Complaint: Knee Pain   History of Present Illness:  Mallory Rodriguez is a 78 y.o. pleasant patient who presents with the following:  Partial menisectomy, R, all on the R side on thanksgiving. Having a great deal of difficulty walking. Has been off of it last night and trouble with walking on a cane. She does have a history of right-sided arthroscopy with partial medial meniscectomy done by Dr. Rosita Kea in 2010. The patient actually was in urgent care this morning when she called her office, and they were unable to aspirate her knee or unwilling to aspirate her knee, and she called our office to see if I could see her.  She is at quite a bit of discomfort, has noted significant osteoarthritic changes in the RIGHT knee surgically diagnosed, and does not have any fever, chills or sweats. No redness associated with the knee. No history of gout.  Drain 40 cc R knee  Patient Active Problem List  Diagnosis  . HYPERLIPIDEMIA  . HYPOKALEMIA  . PERNICIOUS ANEMIA  . GENERALIZED ANXIETY DISORDER  . HYPERTENSION, BENIGN  . HYPERTENSION  . ANGINA PECTORIS  . MITRAL VALVE PROLAPSE  . DIVERTICULOSIS, COLON  . Osteoarthritis, multiple sites  . CONGENITAL HIATUS HERNIA  . DIARRHEA, CHRONIC  . Situational stress  . Actinic keratosis  . Polyarthritis  . Routine general medical examination at a health care facility    Past Medical History  Diagnosis Date  . MVP (mitral valve prolapse)     and tricuspid leak  . AP (angina pectoris)   . Hiatal hernia   . Hypogammaglobulinemia     borderline  . Pelvic kidney     lower left side  . Sciatica   . Claustrophobia   . Diverticulosis of colon   . HLD  (hyperlipidemia)   . Pernicious anemia   . HTN (hypertension)   . OA (osteoarthritis)     Past Surgical History  Procedure Date  . Appendectomy   . Tonsillectomy   . Cholecystectomy   . Right ovary and tube corpus   . Dilation and curettage of uterus   . Breast lumpectomy     Right (fatty tumor)  . Inguinal hernia repair     Left side  . Excision vestibular glands   . Basal cell carcinoma removal     Back  . Intraocular lens insertion     Bilateral  . Film removed left eye   . Total abdominal hysterectomy   . Meniscus repair 08/2009    right    History  Substance Use Topics  . Smoking status: Never Smoker   . Smokeless tobacco: Never Used  . Alcohol Use: Yes     Comment: Regular wine in general    Family History  Problem Relation Age of Onset  . Heart attack Father 53    CABG  . Dementia Mother   . Cancer Maternal Aunt     Breast-multiple aunts and Gm  . Cancer Maternal Uncle     Bladder and lung  . Diabetes Paternal Grandmother   . Hypertension Father   . Hypertension Brother     Allergies  Allergen Reactions  .  Ace Inhibitors   . Contrast Media (Iodinated Diagnostic Agents)   . Epinephrine   . Hydrocodone   . Macrodantin   . Prednisone   . Sulfonamide Derivatives     Medication list has been reviewed and updated.  Outpatient Prescriptions Prior to Visit  Medication Sig Dispense Refill  . Calcium-Vitamin D-Vitamin K (VIACTIV) 500-500-40 MG-UNT-MCG CHEW Chew 1 tablet by mouth daily.        . colestipol (COLESTID) 1 G tablet Take 2 tablets (2 g total) by mouth daily.  60 tablet  11  . Cyanocobalamin (VITAMIN B-12 IJ) Inject as directed every 5 (five) weeks.      . diphenoxylate-atropine (LOMOTIL) 2.5-0.025 MG per tablet TAKE 1 TABLET 3 TIMES A DAY AS NEEDED --MAX 8 TABLETS PER DAY  90 tablet  0  . EPINEPHrine (EPI-PEN) 0.3 mg/0.3 mL DEVI Inject 0.3 mLs (0.3 mg total) into the muscle once.  1 Device  1  . fish oil-omega-3 fatty acids 1000 MG capsule  Take 1 g by mouth daily.        . hydrochlorothiazide (HYDRODIURIL) 25 MG tablet TAKE ONE TABLET BY MOUTH DAILY.  30 tablet  10  . Multiple Vitamins-Minerals (CENTRUM SILVER PO) Take 1 tablet by mouth daily.      . naproxen sodium (ANAPROX) 220 MG tablet Take 220 mg by mouth 2 (two) times daily with a meal.      . nitroGLYCERIN (NITROLINGUAL) 0.4 MG/SPRAY spray Place 1 spray under the tongue every 5 (five) minutes as needed for chest pain.  4.9 g  1  . PATADAY 0.2 % SOLN       . POTASSIUM GLUCONATE PO Take 1 tablet by mouth daily.       Last reviewed on 11/28/2012  2:06 PM by Shon Millet, CMA  Review of Systems:   GEN: No fevers, chills. Nontoxic. Primarily MSK c/o today. MSK: Detailed in the HPI GI: tolerating PO intake without difficulty Neuro: No numbness, parasthesias, or tingling associated. Otherwise the pertinent positives of the ROS are noted above.    Physical Examination: Filed Vitals:   11/28/12 1359  BP: 132/74  Pulse: 96  Temp: 97.8 F (36.6 C)  TempSrc: Oral  Weight: 159 lb 12 oz (72.462 kg)    There is no height on file to calculate BMI. Ideal Body Weight:     GEN: WDWN, NAD, Non-toxic, Alert & Oriented x 3 HEENT: Atraumatic, Normocephalic.  Ears and Nose: No external deformity. EXTR: No clubbing/cyanosis/edema NEURO: Normal gait, but limp PSYCH: Normally interactive. Conversant. Not depressed or anxious appearing.  Calm demeanor.   RIGHT knee: Moderate effusion. Nontender with patellar ballottement. Pain in the medial and lateral joint lines. Nontender at the patellar tendon and quadriceps and patellar tendons are intact. Negative Lachman. Lack of 3 of extension. She is only able to bend her knee to 100 currently secondary to pain. Aggressive manipulation was not done secondary to pain.  Assessment and Plan:  1. Osteoarthritis, knee    2. Vitamin B12 deficiency  cyanocobalamin ((VITAMIN B-12)) injection 1,000 mcg    Classic osteoarthritic  flare of the RIGHT knee. She is tolerating orchis steroid injections well the past and has done well with them providing her good symptom relief. She can continue Naprosyn p.r.n.  Knee Aspiration and Injection, RIGHT Patient verbally consented; risks, benefits, and alternatives explained including possible infection. Patient prepped with Chloraprep. Ethyl chloride for anesthesia. 10 cc of 1% Lidocaine used in wheal then injected Subcutaneous fashion with  22 gauge needle on lateral approach. Under sterilne conditions, 18 gauge needle used via lateral approach to aspirate 40 cc of yellowish synovial fluid. Then 8 cc of Lidocaine 1% and 2 cc of Depo-Medrol 40 mg injected. Tolerated well, decreased pain, no complications.   Orders Today:  No orders of the defined types were placed in this encounter.    Updated Medication List: (Includes new medications, updates to list, dose adjustments) Meds ordered this encounter  Medications  . cyanocobalamin ((VITAMIN B-12)) injection 1,000 mcg    Sig:     Medications Discontinued: There are no discontinued medications.   Hannah Beat, MD

## 2012-12-03 DIAGNOSIS — L570 Actinic keratosis: Secondary | ICD-10-CM | POA: Diagnosis not present

## 2012-12-03 DIAGNOSIS — L708 Other acne: Secondary | ICD-10-CM | POA: Diagnosis not present

## 2012-12-03 DIAGNOSIS — L821 Other seborrheic keratosis: Secondary | ICD-10-CM | POA: Diagnosis not present

## 2012-12-03 DIAGNOSIS — L988 Other specified disorders of the skin and subcutaneous tissue: Secondary | ICD-10-CM | POA: Diagnosis not present

## 2012-12-03 DIAGNOSIS — L738 Other specified follicular disorders: Secondary | ICD-10-CM | POA: Diagnosis not present

## 2012-12-04 DIAGNOSIS — H908 Mixed conductive and sensorineural hearing loss, unspecified: Secondary | ICD-10-CM | POA: Diagnosis not present

## 2012-12-15 DIAGNOSIS — M999 Biomechanical lesion, unspecified: Secondary | ICD-10-CM | POA: Diagnosis not present

## 2012-12-15 DIAGNOSIS — M5430 Sciatica, unspecified side: Secondary | ICD-10-CM | POA: Diagnosis not present

## 2012-12-15 DIAGNOSIS — IMO0002 Reserved for concepts with insufficient information to code with codable children: Secondary | ICD-10-CM | POA: Diagnosis not present

## 2013-01-02 ENCOUNTER — Ambulatory Visit: Payer: Medicare Other

## 2013-01-08 ENCOUNTER — Ambulatory Visit (INDEPENDENT_AMBULATORY_CARE_PROVIDER_SITE_OTHER): Payer: Medicare Other | Admitting: *Deleted

## 2013-01-08 DIAGNOSIS — E538 Deficiency of other specified B group vitamins: Secondary | ICD-10-CM | POA: Diagnosis not present

## 2013-01-08 MED ORDER — CYANOCOBALAMIN 1000 MCG/ML IJ SOLN
1000.0000 ug | Freq: Once | INTRAMUSCULAR | Status: AC
Start: 2013-01-08 — End: 2013-01-08
  Administered 2013-01-08: 1000 ug via INTRAMUSCULAR

## 2013-01-12 DIAGNOSIS — M999 Biomechanical lesion, unspecified: Secondary | ICD-10-CM | POA: Diagnosis not present

## 2013-01-12 DIAGNOSIS — M5430 Sciatica, unspecified side: Secondary | ICD-10-CM | POA: Diagnosis not present

## 2013-01-12 DIAGNOSIS — IMO0002 Reserved for concepts with insufficient information to code with codable children: Secondary | ICD-10-CM | POA: Diagnosis not present

## 2013-02-09 DIAGNOSIS — M5430 Sciatica, unspecified side: Secondary | ICD-10-CM | POA: Diagnosis not present

## 2013-02-09 DIAGNOSIS — IMO0002 Reserved for concepts with insufficient information to code with codable children: Secondary | ICD-10-CM | POA: Diagnosis not present

## 2013-02-09 DIAGNOSIS — M999 Biomechanical lesion, unspecified: Secondary | ICD-10-CM | POA: Diagnosis not present

## 2013-02-12 ENCOUNTER — Other Ambulatory Visit: Payer: Medicare Other

## 2013-02-19 ENCOUNTER — Other Ambulatory Visit (INDEPENDENT_AMBULATORY_CARE_PROVIDER_SITE_OTHER): Payer: Medicare Other | Admitting: *Deleted

## 2013-02-19 DIAGNOSIS — E538 Deficiency of other specified B group vitamins: Secondary | ICD-10-CM | POA: Diagnosis not present

## 2013-02-19 MED ORDER — CYANOCOBALAMIN 1000 MCG/ML IJ SOLN
1000.0000 ug | Freq: Once | INTRAMUSCULAR | Status: AC
  Administered 2013-02-19: 1000 ug via INTRAMUSCULAR

## 2013-02-20 ENCOUNTER — Other Ambulatory Visit: Payer: Medicare Other

## 2013-03-11 DIAGNOSIS — IMO0002 Reserved for concepts with insufficient information to code with codable children: Secondary | ICD-10-CM | POA: Diagnosis not present

## 2013-03-11 DIAGNOSIS — M5430 Sciatica, unspecified side: Secondary | ICD-10-CM | POA: Diagnosis not present

## 2013-03-11 DIAGNOSIS — M999 Biomechanical lesion, unspecified: Secondary | ICD-10-CM | POA: Diagnosis not present

## 2013-03-19 ENCOUNTER — Ambulatory Visit (INDEPENDENT_AMBULATORY_CARE_PROVIDER_SITE_OTHER): Payer: Medicare Other | Admitting: Family Medicine

## 2013-03-19 ENCOUNTER — Encounter: Payer: Self-pay | Admitting: Family Medicine

## 2013-03-19 ENCOUNTER — Ambulatory Visit: Payer: Medicare Other

## 2013-03-19 ENCOUNTER — Ambulatory Visit (INDEPENDENT_AMBULATORY_CARE_PROVIDER_SITE_OTHER)
Admission: RE | Admit: 2013-03-19 | Discharge: 2013-03-19 | Disposition: A | Discharge status: Home / Self Care | Payer: Medicare Other | Source: Ambulatory Visit | Attending: Family Medicine | Admitting: Family Medicine

## 2013-03-19 VITALS — BP 120/64 | HR 68 | Temp 98.2°F | Ht 62.0 in | Wt 164.0 lb

## 2013-03-19 DIAGNOSIS — M25561 Pain in right knee: Secondary | ICD-10-CM

## 2013-03-19 DIAGNOSIS — E538 Deficiency of other specified B group vitamins: Secondary | ICD-10-CM

## 2013-03-19 DIAGNOSIS — M1710 Unilateral primary osteoarthritis, unspecified knee: Secondary | ICD-10-CM | POA: Diagnosis not present

## 2013-03-19 DIAGNOSIS — M25569 Pain in unspecified knee: Secondary | ICD-10-CM

## 2013-03-19 MED ORDER — CYANOCOBALAMIN 1000 MCG/ML IJ SOLN
1000.0000 ug | Freq: Once | INTRAMUSCULAR | Status: AC
  Administered 2013-03-19: 1000 ug via INTRAMUSCULAR

## 2013-03-19 NOTE — Progress Notes (Signed)
Nature conservation officer at [facility] [address] [place] [address] Phone: [phone] Fax: [phone]  Date:  03/19/2013   Name:  Mallory Rodriguez   DOB:  05-04-1933   MRN:  [id number] Gender: female Age: 79 y.o.  Primary Physician:  Tillman Abide, MD  Evaluating MD: Hannah Beat, MD   Chief Complaint: Follow-up and Knee Pain   History of Present Illness:  Mallory Rodriguez is a 79 y.o. pleasant patient who presents with the following:  R knee pain:  Pt seen in the fall in acute pain, we aspirated her knee, 40 cc of fluid, and injected with steroids, and she had a good response. Now c/o knee pain with some worsening, but not as bad as in the fall.  11/28/2012 OV: Partial menisectomy, R, all on the R side on thanksgiving. Having a great deal of difficulty walking. Has been off of it last night and trouble with walking on a cane. She does have a history of right-sided arthroscopy with partial medial meniscectomy done by Dr. Rosita Kea in 2010. The patient actually was in urgent care this morning when she called her office, and they were unable to aspirate her knee or unwilling to aspirate her knee, and she called our office to see if I could see her.  She is at quite a bit of discomfort, has noted significant osteoarthritic changes in the RIGHT knee surgically diagnosed, and does not have any fever, chills or sweats. No redness associated with the knee. No history of gout.   Patient Active Problem List  Diagnosis  . HYPERLIPIDEMIA  . HYPOKALEMIA  . PERNICIOUS ANEMIA  . GENERALIZED ANXIETY DISORDER  . HYPERTENSION, BENIGN  . HYPERTENSION  . ANGINA PECTORIS  . MITRAL VALVE PROLAPSE  . DIVERTICULOSIS, COLON  . Osteoarthritis, multiple sites  . CONGENITAL HIATUS HERNIA  . DIARRHEA, CHRONIC  . Situational stress  . Actinic keratosis  . Polyarthritis  . Routine general medical examination at a health care facility    Past Medical History  Diagnosis Date  . MVP (mitral valve  prolapse)     and tricuspid leak  . AP (angina pectoris)   . Hiatal hernia   . Hypogammaglobulinemia     borderline  . Pelvic kidney     lower left side  . Sciatica   . Claustrophobia   . Diverticulosis of colon   . HLD (hyperlipidemia)   . Pernicious anemia   . HTN (hypertension)   . OA (osteoarthritis)     Past Surgical History  Procedure Laterality Date  . Appendectomy    . Tonsillectomy    . Cholecystectomy    . Right ovary and tube corpus    . Dilation and curettage of uterus    . Breast lumpectomy      Right (fatty tumor)  . Inguinal hernia repair      Left side  . Excision vestibular glands    . Basal cell carcinoma removal      Back  . Intraocular lens insertion      Bilateral  . Film removed left eye    . Total abdominal hysterectomy    . Knee arthroscopy w/ partial medial meniscectomy  08/2009    right    History   Social History  . Marital Status: Married    Spouse Name: N/A    Number of Children: 2  . Years of Education: N/A   Occupational History  . Retired-bookkeeping/secretarial work     retired  .  Social History Main Topics  . Smoking status: Never Smoker   . Smokeless tobacco: Never Used  . Alcohol Use: Yes     Comment: Regular wine in general  . Drug Use: No  . Sexually Active: Not on file   Other Topics Concern  . Not on file   Social History Narrative   Has living will and advance directives. Requests husband, then daughter to make health care decisions.   Would accept resuscitation but no life prolonging technology if cognitively unaware    Family History  Problem Relation Age of Onset  . Heart attack Father 53    CABG  . Dementia Mother   . Cancer Maternal Aunt     Breast-multiple aunts and Gm  . Cancer Maternal Uncle     Bladder and lung  . Diabetes Paternal Grandmother   . Hypertension Father   . Hypertension Brother     Allergies  Allergen Reactions  . Ace Inhibitors   . Contrast Media (Iodinated  Diagnostic Agents)   . Epinephrine   . Hydrocodone   . Macrodantin   . Prednisone   . Sulfonamide Derivatives     Medication list has been reviewed and updated.  Outpatient Prescriptions Prior to Visit  Medication Sig Dispense Refill  . Calcium-Vitamin D-Vitamin K (VIACTIV) 500-500-40 MG-UNT-MCG CHEW Chew 1 tablet by mouth daily.        . colestipol (COLESTID) 1 G tablet Take 2 tablets (2 g total) by mouth daily.  60 tablet  11  . Cyanocobalamin (VITAMIN B-12 IJ) Inject as directed every 5 (five) weeks.      . diphenoxylate-atropine (LOMOTIL) 2.5-0.025 MG per tablet TAKE 1 TABLET 3 TIMES A DAY AS NEEDED --MAX 8 TABLETS PER DAY  90 tablet  0  . EPINEPHrine (EPI-PEN) 0.3 mg/0.3 mL DEVI Inject 0.3 mLs (0.3 mg total) into the muscle once.  1 Device  1  . fish oil-omega-3 fatty acids 1000 MG capsule Take 1 g by mouth daily.        . hydrochlorothiazide (HYDRODIURIL) 25 MG tablet TAKE ONE TABLET BY MOUTH DAILY.  30 tablet  10  . Multiple Vitamins-Minerals (CENTRUM SILVER PO) Take 1 tablet by mouth daily.      . naproxen sodium (ANAPROX) 220 MG tablet Take 220 mg by mouth 2 (two) times daily with a meal.      . nitroGLYCERIN (NITROLINGUAL) 0.4 MG/SPRAY spray Place 1 spray under the tongue every 5 (five) minutes as needed for chest pain.  4.9 g  1  . PATADAY 0.2 % SOLN       . POTASSIUM GLUCONATE PO Take 1 tablet by mouth daily.       No facility-administered medications prior to visit.    Review of Systems:   GEN: No fevers, chills. Nontoxic. Primarily MSK c/o today. MSK: Detailed in the HPI GI: tolerating PO intake without difficulty Neuro: No numbness, parasthesias, or tingling associated. Otherwise the pertinent positives of the ROS are noted above.    Physical Examination: BP 120/64  Pulse 68  Temp(Src) 98.2 F (36.8 C)  Ht 5\' 2"  (1.575 m)  Wt 164 lb (74.39 kg)  BMI 29.99 kg/m2  SpO2 97%  Ideal Body Weight: Weight in (lb) to have BMI = 25: 136.4   GEN: WDWN, NAD,  Non-toxic, Alert & Oriented x 3 HEENT: Atraumatic, Normocephalic.  Ears and Nose: No external deformity. EXTR: No clubbing/cyanosis/edema NEURO: Normal gait, antalgia.  PSYCH: Normally interactive. Conversant. Not depressed or anxious appearing.  Calm demeanor.   R knee: tender medial and lateral joint line. Ext to 0, flexion to 112. Stable MCL, LCL, ACL, PCL. Flexion pinch causes pain. Pes NT.  PT NT.  Assessment and Plan:  Vitamin B 12 deficiency - Plan: cyanocobalamin ((VITAMIN B-12)) injection 1,000 mcg  Right knee pain - Plan: DG Knee AP/LAT W/Sunrise Right  Cont with alleve, tylenol prn. Can f/u if worsens and i could inject knee. Hyaluronic acid reasonable, too, but patient reluctant from allergies.  Dg Knee Ap/lat W/sunrise Right  03/19/2013  *RADIOLOGY REPORT*  Clinical Data: Knee pain.  DG KNEE - 3 VIEWS  Comparison: 09/03/2009 MR.  Findings: Suboptimal technique.  Mild tricompartment degenerative changes.  No fracture or dislocation.  Tiny suprapatellar joint effusion.  IMPRESSION: Mild tricompartment degenerative changes.   Original Report Authenticated By: Lacy Duverney, M.D.     Orders Today:  Orders Placed This Encounter  Procedures  . DG Knee AP/LAT W/Sunrise Right    Standing Status: Future     Number of Occurrences: 1     Standing Expiration Date: 05/19/2014    Order Specific Question:  Reason for exam:    Answer:  R knee pain    Order Specific Question:  Preferred imaging location?    Answer:  [facility]    Updated Medication List: (Includes new medications, updates to list, dose adjustments) Meds ordered this encounter  Medications  . cyanocobalamin ((VITAMIN B-12)) injection 1,000 mcg    Sig:     Medications Discontinued: There are no discontinued medications.    Signed, Elpidio Galea. Devin Foskey, MD 03/19/2013 9:59 AM

## 2013-04-10 DIAGNOSIS — H1045 Other chronic allergic conjunctivitis: Secondary | ICD-10-CM | POA: Diagnosis not present

## 2013-04-23 ENCOUNTER — Ambulatory Visit (INDEPENDENT_AMBULATORY_CARE_PROVIDER_SITE_OTHER): Payer: Medicare Other | Admitting: *Deleted

## 2013-04-23 DIAGNOSIS — D510 Vitamin B12 deficiency anemia due to intrinsic factor deficiency: Secondary | ICD-10-CM

## 2013-04-23 MED ORDER — CYANOCOBALAMIN 1000 MCG/ML IJ SOLN
1000.0000 ug | Freq: Once | INTRAMUSCULAR | Status: AC
  Administered 2013-04-23: 1000 ug via INTRAMUSCULAR

## 2013-04-28 DIAGNOSIS — M5430 Sciatica, unspecified side: Secondary | ICD-10-CM | POA: Diagnosis not present

## 2013-04-28 DIAGNOSIS — M999 Biomechanical lesion, unspecified: Secondary | ICD-10-CM | POA: Diagnosis not present

## 2013-04-28 DIAGNOSIS — IMO0002 Reserved for concepts with insufficient information to code with codable children: Secondary | ICD-10-CM | POA: Diagnosis not present

## 2013-05-18 ENCOUNTER — Encounter: Payer: Self-pay | Admitting: Internal Medicine

## 2013-05-18 ENCOUNTER — Ambulatory Visit (INDEPENDENT_AMBULATORY_CARE_PROVIDER_SITE_OTHER): Payer: Medicare Other | Admitting: Internal Medicine

## 2013-05-18 VITALS — BP 130/78 | HR 77 | Temp 98.6°F | Wt 162.0 lb

## 2013-05-18 DIAGNOSIS — R197 Diarrhea, unspecified: Secondary | ICD-10-CM

## 2013-05-18 DIAGNOSIS — I10 Essential (primary) hypertension: Secondary | ICD-10-CM

## 2013-05-18 DIAGNOSIS — J301 Allergic rhinitis due to pollen: Secondary | ICD-10-CM

## 2013-05-18 DIAGNOSIS — M159 Polyosteoarthritis, unspecified: Secondary | ICD-10-CM

## 2013-05-18 MED ORDER — EPINEPHRINE 0.15 MG/0.3ML IJ SOAJ: 0.1500 mg | INTRAMUSCULAR | Status: DC | PRN

## 2013-05-18 NOTE — Assessment & Plan Note (Signed)
Has improved from the knees Uses aleve prn

## 2013-05-18 NOTE — Progress Notes (Signed)
Subjective:    Patient ID: Mallory Rodriguez, female    DOB: 17-Oct-1933, 79 y.o.   MRN: [id number]  HPI Has been having a lot of allergies Has used some fexofenadine---cough, sneezing and rhinorrhea Has used cough med with some success (DM and guafenisin)  Limited going out due to bees Very allergic Does keep a epipen around  Knee pain is better Still uses aleve when needed Uses "[place] dream cream" with good success  Still has occasional chest pain Tight and pain in left arm---will rarely use NTG with success though her cath was negative Breathing okay  Had to stop the colestipol for bad gas Still uses the imodium and this helps Being careful with diet  Current Outpatient Prescriptions on File Prior to Visit  Medication Sig Dispense Refill  . Calcium-Vitamin D-Vitamin K (VIACTIV) 500-500-40 MG-UNT-MCG CHEW Chew 1 tablet by mouth daily.        . Cyanocobalamin (VITAMIN B-12 IJ) Inject as directed every 5 (five) weeks.      . fish oil-omega-3 fatty acids 1000 MG capsule Take 1 g by mouth daily.        . hydrochlorothiazide (HYDRODIURIL) 25 MG tablet TAKE ONE TABLET BY MOUTH DAILY.  30 tablet  10  . Multiple Vitamins-Minerals (CENTRUM SILVER PO) Take 1 tablet by mouth daily.      . naproxen sodium (ANAPROX) 220 MG tablet Take 220 mg by mouth 2 (two) times daily with a meal.      . nitroGLYCERIN (NITROLINGUAL) 0.4 MG/SPRAY spray Place 1 spray under the tongue every 5 (five) minutes as needed for chest pain.  4.9 g  1  . PATADAY 0.2 % SOLN as directed.       Marland Kitchen POTASSIUM GLUCONATE PO Take 1 tablet by mouth daily.       No current facility-administered medications on file prior to visit.    Allergies  Allergen Reactions  . Ace Inhibitors   . Contrast Media (Iodinated Diagnostic Agents)   . Epinephrine   . Hydrocodone   . Macrodantin   . Prednisone   . Sulfonamide Derivatives     Past Medical History  Diagnosis Date  . MVP (mitral valve prolapse)     and tricuspid leak   . AP (angina pectoris)   . Hiatal hernia   . Hypogammaglobulinemia     borderline  . Pelvic kidney     lower left side  . Sciatica   . Claustrophobia   . Diverticulosis of colon   . HLD (hyperlipidemia)   . Pernicious anemia   . HTN (hypertension)   . OA (osteoarthritis)     Past Surgical History  Procedure Laterality Date  . Appendectomy    . Tonsillectomy    . Cholecystectomy    . Right ovary and tube corpus    . Dilation and curettage of uterus    . Breast lumpectomy      Right (fatty tumor)  . Inguinal hernia repair      Left side  . Excision vestibular glands    . Basal cell carcinoma removal      Back  . Intraocular lens insertion      Bilateral  . Film removed left eye    . Total abdominal hysterectomy    . Knee arthroscopy w/ partial medial meniscectomy  08/2009    right    Family History  Problem Relation Age of Onset  . Heart attack Father 11    CABG  . Dementia Mother   .  Cancer Maternal Aunt     Breast-multiple aunts and Gm  . Cancer Maternal Uncle     Bladder and lung  . Diabetes Paternal Grandmother   . Hypertension Father   . Hypertension Brother     History   Social History  . Marital Status: Married    Spouse Name: N/A    Number of Children: 2  . Years of Education: N/A   Occupational History  . Retired-bookkeeping/secretarial work     retired  .      Social History Main Topics  . Smoking status: Never Smoker   . Smokeless tobacco: Never Used  . Alcohol Use: Yes     Comment: Regular wine in general  . Drug Use: No  . Sexually Active: Not on file   Other Topics Concern  . Not on file   Social History Narrative   Has living will and advance directives. Requests husband, then daughter to make health care decisions.   Would accept resuscitation but no life prolonging technology if cognitively unaware   Review of Systems Appetite is okay She is frustrated that her weight hasn't gone down---is down 2# since last  visit Sleeps okay     Objective:   Physical Exam  Constitutional: She appears well-developed and well-nourished. No distress.  Neck: Normal range of motion. Neck supple. No thyromegaly present.  Cardiovascular: Normal rate, regular rhythm, normal heart sounds and intact distal pulses.  Exam reveals no gallop.   No murmur heard. Pulmonary/Chest: Effort normal and breath sounds normal. No respiratory distress. She has no wheezes. She has no rales.  Abdominal: Soft. There is no tenderness.  Musculoskeletal: She exhibits no edema and no tenderness.  Lymphadenopathy:    She has no cervical adenopathy.  Psychiatric: She has a normal mood and affect. Her behavior is normal.          Assessment & Plan:

## 2013-05-18 NOTE — Assessment & Plan Note (Signed)
BP Readings from Last 3 Encounters:  05/18/13 130/78  03/19/13 120/64  11/28/12 132/74   Good control No changes needed  Labs next time

## 2013-05-18 NOTE — Assessment & Plan Note (Signed)
Had to stop the colestid Still uses the imodium every other day or so

## 2013-05-18 NOTE — Assessment & Plan Note (Signed)
Okay to use fexofenadine

## 2013-05-21 DIAGNOSIS — M5430 Sciatica, unspecified side: Secondary | ICD-10-CM | POA: Diagnosis not present

## 2013-05-21 DIAGNOSIS — IMO0002 Reserved for concepts with insufficient information to code with codable children: Secondary | ICD-10-CM | POA: Diagnosis not present

## 2013-05-21 DIAGNOSIS — M999 Biomechanical lesion, unspecified: Secondary | ICD-10-CM | POA: Diagnosis not present

## 2013-06-03 DIAGNOSIS — L819 Disorder of pigmentation, unspecified: Secondary | ICD-10-CM | POA: Diagnosis not present

## 2013-06-03 DIAGNOSIS — L820 Inflamed seborrheic keratosis: Secondary | ICD-10-CM | POA: Diagnosis not present

## 2013-06-03 DIAGNOSIS — L570 Actinic keratosis: Secondary | ICD-10-CM | POA: Diagnosis not present

## 2013-06-03 DIAGNOSIS — L821 Other seborrheic keratosis: Secondary | ICD-10-CM | POA: Diagnosis not present

## 2013-06-11 DIAGNOSIS — Z1272 Encounter for screening for malignant neoplasm of vagina: Secondary | ICD-10-CM | POA: Diagnosis not present

## 2013-06-11 DIAGNOSIS — Z1289 Encounter for screening for malignant neoplasm of other sites: Secondary | ICD-10-CM | POA: Diagnosis not present

## 2013-06-11 DIAGNOSIS — Z124 Encounter for screening for malignant neoplasm of cervix: Secondary | ICD-10-CM | POA: Diagnosis not present

## 2013-06-11 DIAGNOSIS — Z01419 Encounter for gynecological examination (general) (routine) without abnormal findings: Secondary | ICD-10-CM | POA: Diagnosis not present

## 2013-06-11 DIAGNOSIS — Z1331 Encounter for screening for depression: Secondary | ICD-10-CM | POA: Diagnosis not present

## 2013-06-16 ENCOUNTER — Ambulatory Visit (INDEPENDENT_AMBULATORY_CARE_PROVIDER_SITE_OTHER): Payer: Medicare Other | Admitting: Family Medicine

## 2013-06-16 ENCOUNTER — Encounter: Payer: Self-pay | Admitting: Family Medicine

## 2013-06-16 VITALS — BP 120/68 | HR 65 | Temp 98.4°F | Ht 62.0 in | Wt 163.8 lb

## 2013-06-16 DIAGNOSIS — K145 Plicated tongue: Secondary | ICD-10-CM

## 2013-06-16 NOTE — Patient Instructions (Addendum)
I think you have a fissured/ geographic tongue Have your dentist look at it the next time you go  It also looks like you may bite the inside of your cheeks - be on the lookout for teeth grinding If you develop new symptoms let me know

## 2013-06-16 NOTE — Assessment & Plan Note (Signed)
Reassured patient that this looks ok - as long as she has no symptoms and we can continue to monitor it  She also has bite lines on buccal mucosa laterally- may want to speak to her dentist about grinding teeth

## 2013-06-16 NOTE — Progress Notes (Signed)
Subjective:    Patient ID: Mallory Rodriguez, female    DOB: May 12, 1933, 79 y.o.   MRN: [id number]  HPI Yesterday she noticed some growths on her tongue No pain at all  No sore throat Does have chronic nasal  Drip / allergies   Never smoked Never used smokeless tabacco    Just had some precancerous areas removed from her lip  This worries her    Patient Active Problem List   Diagnosis Date Noted  . Tongue, fissured 06/16/2013  . Allergic rhinitis due to pollen 05/18/2013  . Routine general medical examination at a health care facility 11/18/2012  . Actinic keratosis 05/14/2012  . Osteoarthritis, multiple sites 11/07/2010  . DIARRHEA, CHRONIC 11/07/2010  . HYPERLIPIDEMIA 11/02/2010  . PERNICIOUS ANEMIA 11/02/2010  . DIVERTICULOSIS, COLON 11/02/2010  . GENERALIZED ANXIETY DISORDER 02/09/2010  . HYPERTENSION, BENIGN 02/09/2010  . MITRAL VALVE PROLAPSE 02/09/2010   Past Medical History  Diagnosis Date  . MVP (mitral valve prolapse)     and tricuspid leak  . AP (angina pectoris)   . Hiatal hernia   . Hypogammaglobulinemia     borderline  . Pelvic kidney     lower left side  . Sciatica   . Claustrophobia   . Diverticulosis of colon   . HLD (hyperlipidemia)   . Pernicious anemia   . HTN (hypertension)   . OA (osteoarthritis)    Past Surgical History  Procedure Laterality Date  . Appendectomy    . Tonsillectomy    . Cholecystectomy    . Right ovary and tube corpus    . Dilation and curettage of uterus    . Breast lumpectomy      Right (fatty tumor)  . Inguinal hernia repair      Left side  . Excision vestibular glands    . Basal cell carcinoma removal      Back  . Intraocular lens insertion      Bilateral  . Film removed left eye    . Total abdominal hysterectomy    . Knee arthroscopy w/ partial medial meniscectomy  08/2009    right   History  Substance Use Topics  . Smoking status: Never Smoker   . Smokeless tobacco: Never Used  . Alcohol Use: Yes   Comment: Regular wine a few nights a week   Family History  Problem Relation Age of Onset  . Heart attack Father 6    CABG  . Dementia Mother   . Cancer Maternal Aunt     Breast-multiple aunts and Gm  . Cancer Maternal Uncle     Bladder and lung  . Diabetes Paternal Grandmother   . Hypertension Father   . Hypertension Brother    Allergies  Allergen Reactions  . Ace Inhibitors   . Contrast Media (Iodinated Diagnostic Agents)   . Epinephrine   . Hydrocodone   . Macrodantin   . Prednisone   . Sulfonamide Derivatives    Current Outpatient Prescriptions on File Prior to Visit  Medication Sig Dispense Refill  . Calcium-Vitamin D-Vitamin K (VIACTIV) 500-500-40 MG-UNT-MCG CHEW Chew 1 tablet by mouth daily.        Marland Kitchen EPINEPHrine (EPIPEN JR 2-PAK) 0.15 MG/0.3 ML injection Inject 0.3 mLs (0.15 mg total) into the muscle as needed for anaphylaxis.  1 each  3  . fish oil-omega-3 fatty acids 1000 MG capsule Take 1 g by mouth daily.        . hydrochlorothiazide (HYDRODIURIL) 25 MG tablet TAKE ONE TABLET  BY MOUTH DAILY.  30 tablet  10  . Multiple Vitamins-Minerals (CENTRUM SILVER PO) Take 1 tablet by mouth daily.      . naproxen sodium (ANAPROX) 220 MG tablet Take 220 mg by mouth 2 (two) times daily with a meal.      . nitroGLYCERIN (NITROLINGUAL) 0.4 MG/SPRAY spray Place 1 spray under the tongue every 5 (five) minutes as needed for chest pain.  4.9 g  1  . PATADAY 0.2 % SOLN as directed.       . Probiotic Product (PROBIOTIC DAILY) CAPS Take by mouth daily.      . Cyanocobalamin (VITAMIN B-12 IJ) Inject as directed every 5 (five) weeks.       No current facility-administered medications on file prior to visit.    Review of Systems Review of Systems  Constitutional: Negative for fever, appetite change, fatigue and unexpected weight change.  Eyes: Negative for pain and visual disturbance.  ENt neg for mouth or throat pain , neg for teeth grinding that she knows of  Respiratory: Negative for  cough and shortness of breath.   Cardiovascular: Negative for cp or palpitations    Gastrointestinal: Negative for nausea, diarrhea and constipation.  Genitourinary: Negative for urgency and frequency.  Skin: Negative for pallor or rash   Neurological: Negative for weakness, light-headedness, numbness and headaches.  Hematological: Negative for adenopathy. Does not bruise/bleed easily.  Psychiatric/Behavioral: Negative for dysphoric mood. The patient is not nervous/anxious.         Objective:   Physical Exam  Constitutional: She appears well-developed and well-nourished. No distress.  HENT:  Head: Normocephalic and atraumatic.  Mouth/Throat: Oropharynx is clear and moist.  Tongue is deeply fissured - resembling geographic tongue No lesions/ coating/ tenderness or masses noted  Some bite lines are noted on buccal mucosa bilaterally    Eyes: Conjunctivae and EOM are normal. Pupils are equal, round, and reactive to light. Right eye exhibits no discharge. Left eye exhibits no discharge. No scleral icterus.  Neck: Normal range of motion. Neck supple.  Lymphadenopathy:    She has no cervical adenopathy.  Neurological: She is alert.  Skin: Skin is warm and dry. No rash noted.  Psychiatric: She has a normal mood and affect.          Assessment & Plan:

## 2013-06-18 DIAGNOSIS — M5430 Sciatica, unspecified side: Secondary | ICD-10-CM | POA: Diagnosis not present

## 2013-06-18 DIAGNOSIS — IMO0002 Reserved for concepts with insufficient information to code with codable children: Secondary | ICD-10-CM | POA: Diagnosis not present

## 2013-06-18 DIAGNOSIS — M999 Biomechanical lesion, unspecified: Secondary | ICD-10-CM | POA: Diagnosis not present

## 2013-06-30 DIAGNOSIS — Z1211 Encounter for screening for malignant neoplasm of colon: Secondary | ICD-10-CM | POA: Diagnosis not present

## 2013-07-14 ENCOUNTER — Ambulatory Visit (INDEPENDENT_AMBULATORY_CARE_PROVIDER_SITE_OTHER): Payer: Medicare Other | Admitting: *Deleted

## 2013-07-14 DIAGNOSIS — D510 Vitamin B12 deficiency anemia due to intrinsic factor deficiency: Secondary | ICD-10-CM | POA: Diagnosis not present

## 2013-07-14 MED ORDER — CYANOCOBALAMIN 1000 MCG/ML IJ SOLN
1000.0000 ug | Freq: Once | INTRAMUSCULAR | Status: AC
  Administered 2013-07-14: 1000 ug via INTRAMUSCULAR

## 2013-07-15 DIAGNOSIS — IMO0002 Reserved for concepts with insufficient information to code with codable children: Secondary | ICD-10-CM | POA: Diagnosis not present

## 2013-07-15 DIAGNOSIS — M9981 Other biomechanical lesions of cervical region: Secondary | ICD-10-CM | POA: Diagnosis not present

## 2013-07-15 DIAGNOSIS — M999 Biomechanical lesion, unspecified: Secondary | ICD-10-CM | POA: Diagnosis not present

## 2013-07-15 DIAGNOSIS — R51 Headache: Secondary | ICD-10-CM | POA: Diagnosis not present

## 2013-07-27 ENCOUNTER — Ambulatory Visit: Payer: Self-pay | Admitting: Obstetrics and Gynecology

## 2013-07-27 DIAGNOSIS — R51 Headache: Secondary | ICD-10-CM | POA: Diagnosis not present

## 2013-07-27 DIAGNOSIS — M999 Biomechanical lesion, unspecified: Secondary | ICD-10-CM | POA: Diagnosis not present

## 2013-07-27 DIAGNOSIS — IMO0002 Reserved for concepts with insufficient information to code with codable children: Secondary | ICD-10-CM | POA: Diagnosis not present

## 2013-07-27 DIAGNOSIS — M9981 Other biomechanical lesions of cervical region: Secondary | ICD-10-CM | POA: Diagnosis not present

## 2013-07-27 DIAGNOSIS — Z1231 Encounter for screening mammogram for malignant neoplasm of breast: Secondary | ICD-10-CM | POA: Diagnosis not present

## 2013-07-29 DIAGNOSIS — M999 Biomechanical lesion, unspecified: Secondary | ICD-10-CM | POA: Diagnosis not present

## 2013-07-29 DIAGNOSIS — IMO0002 Reserved for concepts with insufficient information to code with codable children: Secondary | ICD-10-CM | POA: Diagnosis not present

## 2013-07-29 DIAGNOSIS — R51 Headache: Secondary | ICD-10-CM | POA: Diagnosis not present

## 2013-07-29 DIAGNOSIS — M9981 Other biomechanical lesions of cervical region: Secondary | ICD-10-CM | POA: Diagnosis not present

## 2013-08-03 DIAGNOSIS — IMO0002 Reserved for concepts with insufficient information to code with codable children: Secondary | ICD-10-CM | POA: Diagnosis not present

## 2013-08-03 DIAGNOSIS — M9981 Other biomechanical lesions of cervical region: Secondary | ICD-10-CM | POA: Diagnosis not present

## 2013-08-03 DIAGNOSIS — R51 Headache: Secondary | ICD-10-CM | POA: Diagnosis not present

## 2013-08-03 DIAGNOSIS — M999 Biomechanical lesion, unspecified: Secondary | ICD-10-CM | POA: Diagnosis not present

## 2013-08-05 DIAGNOSIS — M9981 Other biomechanical lesions of cervical region: Secondary | ICD-10-CM | POA: Diagnosis not present

## 2013-08-05 DIAGNOSIS — R51 Headache: Secondary | ICD-10-CM | POA: Diagnosis not present

## 2013-08-05 DIAGNOSIS — M999 Biomechanical lesion, unspecified: Secondary | ICD-10-CM | POA: Diagnosis not present

## 2013-08-05 DIAGNOSIS — IMO0002 Reserved for concepts with insufficient information to code with codable children: Secondary | ICD-10-CM | POA: Diagnosis not present

## 2013-08-12 DIAGNOSIS — IMO0002 Reserved for concepts with insufficient information to code with codable children: Secondary | ICD-10-CM | POA: Diagnosis not present

## 2013-08-12 DIAGNOSIS — R51 Headache: Secondary | ICD-10-CM | POA: Diagnosis not present

## 2013-08-12 DIAGNOSIS — M999 Biomechanical lesion, unspecified: Secondary | ICD-10-CM | POA: Diagnosis not present

## 2013-08-12 DIAGNOSIS — M9981 Other biomechanical lesions of cervical region: Secondary | ICD-10-CM | POA: Diagnosis not present

## 2013-08-20 ENCOUNTER — Ambulatory Visit: Payer: Medicare Other

## 2013-08-26 DIAGNOSIS — IMO0002 Reserved for concepts with insufficient information to code with codable children: Secondary | ICD-10-CM | POA: Diagnosis not present

## 2013-08-26 DIAGNOSIS — M9981 Other biomechanical lesions of cervical region: Secondary | ICD-10-CM | POA: Diagnosis not present

## 2013-08-26 DIAGNOSIS — R51 Headache: Secondary | ICD-10-CM | POA: Diagnosis not present

## 2013-08-26 DIAGNOSIS — M999 Biomechanical lesion, unspecified: Secondary | ICD-10-CM | POA: Diagnosis not present

## 2013-08-27 ENCOUNTER — Ambulatory Visit (INDEPENDENT_AMBULATORY_CARE_PROVIDER_SITE_OTHER): Payer: Medicare Other | Admitting: *Deleted

## 2013-08-27 DIAGNOSIS — D510 Vitamin B12 deficiency anemia due to intrinsic factor deficiency: Secondary | ICD-10-CM | POA: Diagnosis not present

## 2013-08-27 MED ORDER — CYANOCOBALAMIN 1000 MCG/ML IJ SOLN
1000.0000 ug | Freq: Once | INTRAMUSCULAR | Status: AC
  Administered 2013-08-27: 1000 ug via INTRAMUSCULAR

## 2013-09-16 DIAGNOSIS — IMO0002 Reserved for concepts with insufficient information to code with codable children: Secondary | ICD-10-CM | POA: Diagnosis not present

## 2013-09-16 DIAGNOSIS — M999 Biomechanical lesion, unspecified: Secondary | ICD-10-CM | POA: Diagnosis not present

## 2013-09-16 DIAGNOSIS — M9981 Other biomechanical lesions of cervical region: Secondary | ICD-10-CM | POA: Diagnosis not present

## 2013-09-16 DIAGNOSIS — R51 Headache: Secondary | ICD-10-CM | POA: Diagnosis not present

## 2013-10-01 ENCOUNTER — Ambulatory Visit: Payer: Medicare Other

## 2013-10-07 DIAGNOSIS — L723 Sebaceous cyst: Secondary | ICD-10-CM | POA: Diagnosis not present

## 2013-10-07 DIAGNOSIS — L819 Disorder of pigmentation, unspecified: Secondary | ICD-10-CM | POA: Diagnosis not present

## 2013-10-07 DIAGNOSIS — L570 Actinic keratosis: Secondary | ICD-10-CM | POA: Diagnosis not present

## 2013-10-08 ENCOUNTER — Ambulatory Visit (INDEPENDENT_AMBULATORY_CARE_PROVIDER_SITE_OTHER): Payer: Medicare Other | Admitting: *Deleted

## 2013-10-08 ENCOUNTER — Other Ambulatory Visit: Payer: Self-pay | Admitting: Internal Medicine

## 2013-10-08 DIAGNOSIS — Z23 Encounter for immunization: Secondary | ICD-10-CM

## 2013-10-08 DIAGNOSIS — E538 Deficiency of other specified B group vitamins: Secondary | ICD-10-CM | POA: Diagnosis not present

## 2013-10-08 MED ORDER — CYANOCOBALAMIN 1000 MCG/ML IJ SOLN
1000.0000 ug | Freq: Once | INTRAMUSCULAR | Status: AC
  Administered 2013-10-08: 1000 ug via INTRAMUSCULAR

## 2013-10-14 DIAGNOSIS — R51 Headache: Secondary | ICD-10-CM | POA: Diagnosis not present

## 2013-10-14 DIAGNOSIS — M9981 Other biomechanical lesions of cervical region: Secondary | ICD-10-CM | POA: Diagnosis not present

## 2013-10-14 DIAGNOSIS — IMO0002 Reserved for concepts with insufficient information to code with codable children: Secondary | ICD-10-CM | POA: Diagnosis not present

## 2013-10-14 DIAGNOSIS — M999 Biomechanical lesion, unspecified: Secondary | ICD-10-CM | POA: Diagnosis not present

## 2013-10-26 DIAGNOSIS — H0489 Other disorders of lacrimal system: Secondary | ICD-10-CM | POA: Diagnosis not present

## 2013-11-02 DIAGNOSIS — H0489 Other disorders of lacrimal system: Secondary | ICD-10-CM | POA: Diagnosis not present

## 2013-11-04 ENCOUNTER — Other Ambulatory Visit: Payer: Self-pay | Admitting: *Deleted

## 2013-11-04 DIAGNOSIS — M130 Polyarthritis, unspecified: Secondary | ICD-10-CM

## 2013-11-04 MED ORDER — COLESTIPOL HCL 1 G PO TABS: 2.0000 g | ORAL_TABLET | Freq: Every day | ORAL | Status: DC

## 2013-11-04 NOTE — Telephone Encounter (Signed)
Received a refill request for colestipol, pt had d/c med, but it was last filled on 08/29/2013. Pt has not had lipids done since 5/13. Is it ok to refill?

## 2013-11-04 NOTE — Telephone Encounter (Signed)
rx sent to pharmacy by e-script  

## 2013-11-04 NOTE — Telephone Encounter (Signed)
It is used for chronic diarrhea Okay to refill for a year No blood work needed for it

## 2013-11-12 ENCOUNTER — Ambulatory Visit: Payer: Medicare Other

## 2013-11-19 ENCOUNTER — Ambulatory Visit (INDEPENDENT_AMBULATORY_CARE_PROVIDER_SITE_OTHER): Payer: Medicare Other

## 2013-11-19 DIAGNOSIS — E538 Deficiency of other specified B group vitamins: Secondary | ICD-10-CM

## 2013-11-19 MED ORDER — CYANOCOBALAMIN 1000 MCG/ML IJ SOLN
1000.0000 ug | Freq: Once | INTRAMUSCULAR | Status: AC
Start: 2013-11-19 — End: 2013-11-19
  Administered 2013-11-19: 1000 ug via INTRAMUSCULAR

## 2013-11-24 ENCOUNTER — Ambulatory Visit (INDEPENDENT_AMBULATORY_CARE_PROVIDER_SITE_OTHER): Payer: Medicare Other | Admitting: Internal Medicine

## 2013-11-24 ENCOUNTER — Encounter: Payer: Self-pay | Admitting: Internal Medicine

## 2013-11-24 VITALS — BP 130/70 | HR 83 | Temp 98.5°F | Ht 62.0 in | Wt 161.0 lb

## 2013-11-24 DIAGNOSIS — I10 Essential (primary) hypertension: Secondary | ICD-10-CM

## 2013-11-24 DIAGNOSIS — M159 Polyosteoarthritis, unspecified: Secondary | ICD-10-CM

## 2013-11-24 DIAGNOSIS — Z0000 Encounter for general adult medical examination without abnormal findings: Secondary | ICD-10-CM

## 2013-11-24 DIAGNOSIS — R197 Diarrhea, unspecified: Secondary | ICD-10-CM

## 2013-11-24 DIAGNOSIS — E785 Hyperlipidemia, unspecified: Secondary | ICD-10-CM

## 2013-11-24 LAB — HEPATIC FUNCTION PANEL
AST: 30 U/L (ref 0–37)
Albumin: 4.2 g/dL (ref 3.5–5.2)
Alkaline Phosphatase: 49 U/L (ref 39–117)
Bilirubin, Direct: 0.1 mg/dL (ref 0.0–0.3)
Total Protein: 7.3 g/dL (ref 6.0–8.3)

## 2013-11-24 LAB — CBC WITH DIFFERENTIAL/PLATELET
Eosinophils Relative: 4.1 % (ref 0.0–5.0)
HCT: 39.3 % (ref 36.0–46.0)
Hemoglobin: 13.3 g/dL (ref 12.0–15.0)
Lymphocytes Relative: 16.9 % (ref 12.0–46.0)
Lymphs Abs: 0.7 10*3/uL (ref 0.7–4.0)
MCV: 93.9 fl (ref 78.0–100.0)
Monocytes Relative: 15.2 % — ABNORMAL HIGH (ref 3.0–12.0)
Neutro Abs: 2.7 10*3/uL (ref 1.4–7.7)
WBC: 4.3 10*3/uL — ABNORMAL LOW (ref 4.5–10.5)

## 2013-11-24 LAB — LIPID PANEL
HDL: 120.8 mg/dL (ref >39.00)
Total CHOL/HDL Ratio: 2
VLDL: 15.8 mg/dL (ref 0.0–40.0)

## 2013-11-24 LAB — BASIC METABOLIC PANEL
Calcium: 9.3 mg/dL (ref 8.4–10.5)
Chloride: 101 mEq/L (ref 96–112)
GFR: 58.34 mL/min — ABNORMAL LOW (ref >60.00)
Glucose, Bld: 99 mg/dL (ref 70–99)
Potassium: 3.4 mEq/L — ABNORMAL LOW (ref 3.5–5.1)
Sodium: 137 mEq/L (ref 135–145)

## 2013-11-24 MED ORDER — NITROGLYCERIN 0.4 MG/SPRAY TL SOLN: 1.0000 | Status: DC | PRN
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
## 2013-11-24 NOTE — Addendum Note (Signed)
Addended by: Sueanne Margarita on: 11/24/2013 09:24 AM   Modules accepted: Orders

## 2013-11-24 NOTE — Assessment & Plan Note (Signed)
BP Readings from Last 3 Encounters:  11/24/13 130/70  06/16/13 120/68  05/18/13 130/78   Good control No changes needed

## 2013-11-24 NOTE — Assessment & Plan Note (Signed)
I have personally reviewed the Medicare Annual Wellness questionnaire and have noted 1. The patient's medical and social history 2. Their use of alcohol, tobacco or illicit drugs 3. Their current medications and supplements 4. The patient's functional ability including ADL's, fall risks, home safety risks and hearing or visual             impairment. 5. Diet and physical activities 6. Evidence for depression or mood disorders  The patients weight, height, BMI and visual acuity have been recorded in the chart I have made referrals, counseling and provided education to the patient based review of the above and I have provided the pt with a written personalized care plan for preventive services.  I have provided you with a copy of your personalized plan for preventive services. Please take the time to review along with your updated medication list.  UTD on imms and cancer screening Counseling done

## 2013-11-24 NOTE — Assessment & Plan Note (Signed)
Okay with limited regimen No changes

## 2013-11-24 NOTE — Assessment & Plan Note (Signed)
Mild Low risk so will not use statin

## 2013-11-24 NOTE — Progress Notes (Signed)
Pre-visit discussion using our clinic review tool. No additional management support is needed unless otherwise documented below in the visit note.  

## 2013-11-24 NOTE — Assessment & Plan Note (Signed)
Does okay with imodium and colestipol

## 2013-11-25 LAB — TSH: TSH: 1.4 u[IU]/mL (ref 0.35–5.50)

## 2013-11-30 ENCOUNTER — Encounter: Payer: Self-pay | Admitting: *Deleted

## 2013-12-04 DIAGNOSIS — D2320 Other benign neoplasm of skin of unspecified ear and external auricular canal: Secondary | ICD-10-CM | POA: Diagnosis not present

## 2013-12-07 DIAGNOSIS — IMO0002 Reserved for concepts with insufficient information to code with codable children: Secondary | ICD-10-CM | POA: Diagnosis not present

## 2013-12-07 DIAGNOSIS — M999 Biomechanical lesion, unspecified: Secondary | ICD-10-CM | POA: Diagnosis not present

## 2013-12-07 DIAGNOSIS — R51 Headache: Secondary | ICD-10-CM | POA: Diagnosis not present

## 2013-12-07 DIAGNOSIS — M9981 Other biomechanical lesions of cervical region: Secondary | ICD-10-CM | POA: Diagnosis not present

## 2013-12-18 ENCOUNTER — Encounter: Payer: Self-pay | Admitting: Internal Medicine

## 2013-12-18 ENCOUNTER — Ambulatory Visit (INDEPENDENT_AMBULATORY_CARE_PROVIDER_SITE_OTHER): Payer: Medicare Other | Admitting: Internal Medicine

## 2013-12-18 VITALS — BP 140/80 | HR 72 | Temp 98.4°F | Wt 163.0 lb

## 2013-12-18 DIAGNOSIS — L600 Ingrowing nail: Secondary | ICD-10-CM

## 2013-12-18 NOTE — Progress Notes (Signed)
Pre-visit discussion using our clinic review tool. No additional management support is needed unless otherwise documented below in the visit note.  

## 2013-12-18 NOTE — Assessment & Plan Note (Signed)
Lateral border elevated and debrided with relief of the pain Discussed home care

## 2013-12-18 NOTE — Progress Notes (Signed)
Subjective:    Patient ID: Mallory Rodriguez, female    DOB: 06-11-1933, 79 y.o.   MRN: [id number]  HPI Had surgery some years ago on right 2nd toe Has had fungal meds from Dr Ether Griffins at times also (has liquid) Gets shooting pains in that toe in closed shoes Pain is going up the leg now  Has tried bandaid and foot creams ?ingrown nail No discharge Very little redness  Current Outpatient Prescriptions on File Prior to Visit  Medication Sig Dispense Refill  . Calcium-Vitamin D-Vitamin K (VIACTIV) 500-500-40 MG-UNT-MCG CHEW Chew 1 tablet by mouth daily.        . colestipol (COLESTID) 1 G tablet Take 2 tablets (2 g total) by mouth daily.  60 tablet  11  . Cranberry 200 MG CAPS Take 1 capsule by mouth daily.      . Cyanocobalamin (VITAMIN B-12 IJ) Inject as directed every 5 (five) weeks.      Marland Kitchen EPINEPHrine (EPIPEN JR 2-PAK) 0.15 MG/0.3 ML injection Inject 0.3 mLs (0.15 mg total) into the muscle as needed for anaphylaxis.  1 each  3  . fish oil-omega-3 fatty acids 1000 MG capsule Take 1 g by mouth daily.        . hydrochlorothiazide (HYDRODIURIL) 25 MG tablet TAKE ONE TABLET BY MOUTH DAILY.  30 tablet  9  . Multiple Vitamins-Minerals (CENTRUM SILVER PO) Take 1 tablet by mouth daily.      . naproxen sodium (ANAPROX) 220 MG tablet Take 220 mg by mouth 2 (two) times daily with a meal.      . nitroGLYCERIN (NITROLINGUAL) 0.4 MG/SPRAY spray Place 1 spray under the tongue every 5 (five) minutes as needed for chest pain.  4.9 g  6  . Probiotic Product (PROBIOTIC DAILY) CAPS Take by mouth daily.      Marland Kitchen tobramycin-dexamethasone (TOBRADEX) ophthalmic solution Place 2 drops into both eyes as directed.        No current facility-administered medications on file prior to visit.    Allergies  Allergen Reactions  . Ace Inhibitors   . Contrast Media [Iodinated Diagnostic Agents]   . Epinephrine   . Hydrocodone   . Macrodantin   . Prednisone   . Sulfonamide Derivatives     Past Medical History    Diagnosis Date  . MVP (mitral valve prolapse)     and tricuspid leak  . AP (angina pectoris)   . Hiatal hernia   . Hypogammaglobulinemia     borderline  . Pelvic kidney     lower left side  . Sciatica   . Claustrophobia   . Diverticulosis of colon   . HLD (hyperlipidemia)   . Pernicious anemia   . HTN (hypertension)   . OA (osteoarthritis)     Past Surgical History  Procedure Laterality Date  . Appendectomy    . Tonsillectomy    . Cholecystectomy    . Right ovary and tube corpus    . Dilation and curettage of uterus    . Breast lumpectomy      Right (fatty tumor)  . Inguinal hernia repair      Left side  . Excision vestibular glands    . Basal cell carcinoma removal      Back  . Intraocular lens insertion      Bilateral  . Film removed left eye    . Total abdominal hysterectomy    . Knee arthroscopy w/ partial medial meniscectomy  08/2009    right  Family History  Problem Relation Age of Onset  . Heart attack Father 96    CABG  . Dementia Mother   . Cancer Maternal Aunt     Breast-multiple aunts and Gm  . Cancer Maternal Uncle     Bladder and lung  . Diabetes Paternal Grandmother   . Hypertension Father   . Hypertension Brother     History   Social History  . Marital Status: Married    Spouse Name: N/A    Number of Children: 2  . Years of Education: N/A   Occupational History  . Retired-bookkeeping/secretarial work     retired  .      Social History Main Topics  . Smoking status: Never Smoker   . Smokeless tobacco: Never Used  . Alcohol Use: Yes     Comment: Regular wine a few nights a week  . Drug Use: No  . Sexual Activity: Not on file   Other Topics Concern  . Not on file   Social History Narrative   Has living will and advance directives.    Requests husband, then daughter to make health care decisions.   Would accept resuscitation but no life prolonging technology if cognitively unaware   Review of Systems No fever Using the  fungal med over the past couple of weeks---just stopped    Objective:   Physical Exam  Constitutional: She appears well-developed and well-nourished. No distress.  Musculoskeletal:  In inflammation or sig redness No discharge Some tenderness along lateral distal nail border          Assessment & Plan:

## 2013-12-29 ENCOUNTER — Ambulatory Visit (INDEPENDENT_AMBULATORY_CARE_PROVIDER_SITE_OTHER): Payer: Medicare Other

## 2013-12-29 DIAGNOSIS — E538 Deficiency of other specified B group vitamins: Secondary | ICD-10-CM

## 2013-12-29 MED ORDER — CYANOCOBALAMIN 1000 MCG/ML IJ SOLN
1000.0000 ug | Freq: Once | INTRAMUSCULAR | Status: AC
  Administered 2013-12-29: 1000 ug via INTRAMUSCULAR

## 2014-01-05 DIAGNOSIS — IMO0002 Reserved for concepts with insufficient information to code with codable children: Secondary | ICD-10-CM | POA: Diagnosis not present

## 2014-01-05 DIAGNOSIS — M9981 Other biomechanical lesions of cervical region: Secondary | ICD-10-CM | POA: Diagnosis not present

## 2014-01-05 DIAGNOSIS — R51 Headache: Secondary | ICD-10-CM | POA: Diagnosis not present

## 2014-01-05 DIAGNOSIS — M999 Biomechanical lesion, unspecified: Secondary | ICD-10-CM | POA: Diagnosis not present

## 2014-02-01 DIAGNOSIS — M9981 Other biomechanical lesions of cervical region: Secondary | ICD-10-CM | POA: Diagnosis not present

## 2014-02-01 DIAGNOSIS — IMO0002 Reserved for concepts with insufficient information to code with codable children: Secondary | ICD-10-CM | POA: Diagnosis not present

## 2014-02-01 DIAGNOSIS — M999 Biomechanical lesion, unspecified: Secondary | ICD-10-CM | POA: Diagnosis not present

## 2014-02-01 DIAGNOSIS — R51 Headache: Secondary | ICD-10-CM | POA: Diagnosis not present

## 2014-02-02 ENCOUNTER — Ambulatory Visit (INDEPENDENT_AMBULATORY_CARE_PROVIDER_SITE_OTHER): Payer: Medicare Other | Admitting: *Deleted

## 2014-02-02 DIAGNOSIS — D510 Vitamin B12 deficiency anemia due to intrinsic factor deficiency: Secondary | ICD-10-CM | POA: Diagnosis not present

## 2014-02-02 MED ORDER — CYANOCOBALAMIN 1000 MCG/ML IJ SOLN
1000.0000 ug | Freq: Once | INTRAMUSCULAR | Status: AC
  Administered 2014-02-02: 1000 ug via INTRAMUSCULAR

## 2014-03-01 DIAGNOSIS — M999 Biomechanical lesion, unspecified: Secondary | ICD-10-CM | POA: Diagnosis not present

## 2014-03-01 DIAGNOSIS — M9981 Other biomechanical lesions of cervical region: Secondary | ICD-10-CM | POA: Diagnosis not present

## 2014-03-01 DIAGNOSIS — R51 Headache: Secondary | ICD-10-CM | POA: Diagnosis not present

## 2014-03-01 DIAGNOSIS — IMO0002 Reserved for concepts with insufficient information to code with codable children: Secondary | ICD-10-CM | POA: Diagnosis not present

## 2014-03-04 ENCOUNTER — Ambulatory Visit: Payer: Medicare Other

## 2014-03-09 ENCOUNTER — Ambulatory Visit (INDEPENDENT_AMBULATORY_CARE_PROVIDER_SITE_OTHER): Payer: Medicare Other

## 2014-03-09 DIAGNOSIS — E538 Deficiency of other specified B group vitamins: Secondary | ICD-10-CM | POA: Diagnosis not present

## 2014-03-09 DIAGNOSIS — H01009 Unspecified blepharitis unspecified eye, unspecified eyelid: Secondary | ICD-10-CM | POA: Diagnosis not present

## 2014-03-09 MED ORDER — CYANOCOBALAMIN 1000 MCG/ML IJ SOLN
1000.0000 ug | Freq: Once | INTRAMUSCULAR | Status: AC
  Administered 2014-03-09: 1000 ug via INTRAMUSCULAR

## 2014-03-25 DIAGNOSIS — H01009 Unspecified blepharitis unspecified eye, unspecified eyelid: Secondary | ICD-10-CM | POA: Diagnosis not present

## 2014-03-30 DIAGNOSIS — IMO0002 Reserved for concepts with insufficient information to code with codable children: Secondary | ICD-10-CM | POA: Diagnosis not present

## 2014-03-30 DIAGNOSIS — M999 Biomechanical lesion, unspecified: Secondary | ICD-10-CM | POA: Diagnosis not present

## 2014-03-30 DIAGNOSIS — R51 Headache: Secondary | ICD-10-CM | POA: Diagnosis not present

## 2014-03-30 DIAGNOSIS — M9981 Other biomechanical lesions of cervical region: Secondary | ICD-10-CM | POA: Diagnosis not present

## 2014-04-13 DIAGNOSIS — H2510 Age-related nuclear cataract, unspecified eye: Secondary | ICD-10-CM | POA: Diagnosis not present

## 2014-04-13 DIAGNOSIS — Z961 Presence of intraocular lens: Secondary | ICD-10-CM | POA: Diagnosis not present

## 2014-04-14 ENCOUNTER — Ambulatory Visit (INDEPENDENT_AMBULATORY_CARE_PROVIDER_SITE_OTHER): Payer: Medicare Other

## 2014-04-14 DIAGNOSIS — E538 Deficiency of other specified B group vitamins: Secondary | ICD-10-CM

## 2014-04-14 MED ORDER — CYANOCOBALAMIN 1000 MCG/ML IJ SOLN
1000.0000 ug | Freq: Once | INTRAMUSCULAR | Status: AC
  Administered 2014-04-14: 1000 ug via INTRAMUSCULAR

## 2014-04-16 DIAGNOSIS — L570 Actinic keratosis: Secondary | ICD-10-CM | POA: Diagnosis not present

## 2014-04-16 DIAGNOSIS — L578 Other skin changes due to chronic exposure to nonionizing radiation: Secondary | ICD-10-CM | POA: Diagnosis not present

## 2014-04-16 DIAGNOSIS — L819 Disorder of pigmentation, unspecified: Secondary | ICD-10-CM | POA: Diagnosis not present

## 2014-04-27 DIAGNOSIS — M9981 Other biomechanical lesions of cervical region: Secondary | ICD-10-CM | POA: Diagnosis not present

## 2014-04-27 DIAGNOSIS — IMO0002 Reserved for concepts with insufficient information to code with codable children: Secondary | ICD-10-CM | POA: Diagnosis not present

## 2014-04-27 DIAGNOSIS — R51 Headache: Secondary | ICD-10-CM | POA: Diagnosis not present

## 2014-04-27 DIAGNOSIS — M999 Biomechanical lesion, unspecified: Secondary | ICD-10-CM | POA: Diagnosis not present

## 2014-05-19 ENCOUNTER — Ambulatory Visit (INDEPENDENT_AMBULATORY_CARE_PROVIDER_SITE_OTHER): Payer: Medicare Other

## 2014-05-19 DIAGNOSIS — E538 Deficiency of other specified B group vitamins: Secondary | ICD-10-CM | POA: Diagnosis not present

## 2014-05-19 MED ORDER — CYANOCOBALAMIN 1000 MCG/ML IJ SOLN
1000.0000 ug | Freq: Once | INTRAMUSCULAR | Status: AC
  Administered 2014-05-19: 1000 ug via INTRAMUSCULAR

## 2014-05-31 DIAGNOSIS — M9981 Other biomechanical lesions of cervical region: Secondary | ICD-10-CM | POA: Diagnosis not present

## 2014-05-31 DIAGNOSIS — IMO0002 Reserved for concepts with insufficient information to code with codable children: Secondary | ICD-10-CM | POA: Diagnosis not present

## 2014-05-31 DIAGNOSIS — R51 Headache: Secondary | ICD-10-CM | POA: Diagnosis not present

## 2014-05-31 DIAGNOSIS — M999 Biomechanical lesion, unspecified: Secondary | ICD-10-CM | POA: Diagnosis not present

## 2014-06-29 ENCOUNTER — Other Ambulatory Visit: Payer: Self-pay | Admitting: Internal Medicine

## 2014-06-29 DIAGNOSIS — M999 Biomechanical lesion, unspecified: Secondary | ICD-10-CM | POA: Diagnosis not present

## 2014-06-29 DIAGNOSIS — R51 Headache: Secondary | ICD-10-CM | POA: Diagnosis not present

## 2014-06-29 DIAGNOSIS — IMO0002 Reserved for concepts with insufficient information to code with codable children: Secondary | ICD-10-CM | POA: Diagnosis not present

## 2014-06-29 DIAGNOSIS — M9981 Other biomechanical lesions of cervical region: Secondary | ICD-10-CM | POA: Diagnosis not present

## 2014-06-30 ENCOUNTER — Ambulatory Visit (INDEPENDENT_AMBULATORY_CARE_PROVIDER_SITE_OTHER): Payer: Medicare Other

## 2014-06-30 DIAGNOSIS — E538 Deficiency of other specified B group vitamins: Secondary | ICD-10-CM | POA: Diagnosis not present

## 2014-06-30 MED ORDER — CYANOCOBALAMIN 1000 MCG/ML IJ SOLN
1000.0000 ug | Freq: Once | INTRAMUSCULAR | Status: AC
  Administered 2014-06-30: 1000 ug via INTRAMUSCULAR

## 2014-07-15 DIAGNOSIS — L820 Inflamed seborrheic keratosis: Secondary | ICD-10-CM | POA: Diagnosis not present

## 2014-07-15 DIAGNOSIS — L819 Disorder of pigmentation, unspecified: Secondary | ICD-10-CM | POA: Diagnosis not present

## 2014-08-10 DIAGNOSIS — M999 Biomechanical lesion, unspecified: Secondary | ICD-10-CM | POA: Diagnosis not present

## 2014-08-10 DIAGNOSIS — M9981 Other biomechanical lesions of cervical region: Secondary | ICD-10-CM | POA: Diagnosis not present

## 2014-08-10 DIAGNOSIS — IMO0002 Reserved for concepts with insufficient information to code with codable children: Secondary | ICD-10-CM | POA: Diagnosis not present

## 2014-08-10 DIAGNOSIS — R51 Headache: Secondary | ICD-10-CM | POA: Diagnosis not present

## 2014-08-11 ENCOUNTER — Ambulatory Visit (INDEPENDENT_AMBULATORY_CARE_PROVIDER_SITE_OTHER): Payer: Medicare Other

## 2014-08-11 DIAGNOSIS — E538 Deficiency of other specified B group vitamins: Secondary | ICD-10-CM | POA: Diagnosis not present

## 2014-08-11 MED ORDER — CYANOCOBALAMIN 1000 MCG/ML IJ SOLN
1000.0000 ug | Freq: Once | INTRAMUSCULAR | Status: AC
  Administered 2014-08-11: 1000 ug via INTRAMUSCULAR

## 2014-09-06 ENCOUNTER — Other Ambulatory Visit: Payer: Self-pay | Admitting: Internal Medicine

## 2014-09-07 DIAGNOSIS — M999 Biomechanical lesion, unspecified: Secondary | ICD-10-CM | POA: Diagnosis not present

## 2014-09-07 DIAGNOSIS — IMO0002 Reserved for concepts with insufficient information to code with codable children: Secondary | ICD-10-CM | POA: Diagnosis not present

## 2014-09-07 DIAGNOSIS — R51 Headache: Secondary | ICD-10-CM | POA: Diagnosis not present

## 2014-09-07 DIAGNOSIS — M9981 Other biomechanical lesions of cervical region: Secondary | ICD-10-CM | POA: Diagnosis not present

## 2014-09-15 ENCOUNTER — Ambulatory Visit (INDEPENDENT_AMBULATORY_CARE_PROVIDER_SITE_OTHER): Payer: Medicare Other

## 2014-09-15 DIAGNOSIS — E538 Deficiency of other specified B group vitamins: Secondary | ICD-10-CM

## 2014-09-15 DIAGNOSIS — Z23 Encounter for immunization: Secondary | ICD-10-CM | POA: Diagnosis not present

## 2014-09-15 MED ORDER — CYANOCOBALAMIN 1000 MCG/ML IJ SOLN
1000.0000 ug | Freq: Once | INTRAMUSCULAR | Status: AC
  Administered 2014-09-15: 1000 ug via INTRAMUSCULAR

## 2014-09-29 ENCOUNTER — Ambulatory Visit: Payer: Self-pay | Admitting: Obstetrics and Gynecology

## 2014-09-29 DIAGNOSIS — Z1231 Encounter for screening mammogram for malignant neoplasm of breast: Secondary | ICD-10-CM | POA: Diagnosis not present

## 2014-10-05 DIAGNOSIS — M9902 Segmental and somatic dysfunction of thoracic region: Secondary | ICD-10-CM | POA: Diagnosis not present

## 2014-10-05 DIAGNOSIS — M5134 Other intervertebral disc degeneration, thoracic region: Secondary | ICD-10-CM | POA: Diagnosis not present

## 2014-10-05 DIAGNOSIS — M9901 Segmental and somatic dysfunction of cervical region: Secondary | ICD-10-CM | POA: Diagnosis not present

## 2014-10-05 DIAGNOSIS — R51 Headache: Secondary | ICD-10-CM | POA: Diagnosis not present

## 2014-10-12 DIAGNOSIS — L578 Other skin changes due to chronic exposure to nonionizing radiation: Secondary | ICD-10-CM | POA: Diagnosis not present

## 2014-10-12 DIAGNOSIS — L821 Other seborrheic keratosis: Secondary | ICD-10-CM | POA: Diagnosis not present

## 2014-10-12 DIAGNOSIS — D1800 Hemangioma unspecified site: Secondary | ICD-10-CM | POA: Diagnosis not present

## 2014-10-12 DIAGNOSIS — Z1283 Encounter for screening for malignant neoplasm of skin: Secondary | ICD-10-CM | POA: Diagnosis not present

## 2014-10-12 DIAGNOSIS — L814 Other melanin hyperpigmentation: Secondary | ICD-10-CM | POA: Diagnosis not present

## 2014-10-12 DIAGNOSIS — L570 Actinic keratosis: Secondary | ICD-10-CM | POA: Diagnosis not present

## 2014-10-12 DIAGNOSIS — L853 Xerosis cutis: Secondary | ICD-10-CM | POA: Diagnosis not present

## 2014-10-12 DIAGNOSIS — L820 Inflamed seborrheic keratosis: Secondary | ICD-10-CM | POA: Diagnosis not present

## 2014-10-21 ENCOUNTER — Ambulatory Visit (INDEPENDENT_AMBULATORY_CARE_PROVIDER_SITE_OTHER): Payer: Medicare Other

## 2014-10-21 DIAGNOSIS — E538 Deficiency of other specified B group vitamins: Secondary | ICD-10-CM | POA: Diagnosis not present

## 2014-10-21 MED ORDER — CYANOCOBALAMIN 1000 MCG/ML IJ SOLN
1000.0000 ug | Freq: Once | INTRAMUSCULAR | Status: AC
  Administered 2014-10-21: 1000 ug via INTRAMUSCULAR

## 2014-11-29 DIAGNOSIS — R51 Headache: Secondary | ICD-10-CM | POA: Diagnosis not present

## 2014-11-29 DIAGNOSIS — M9901 Segmental and somatic dysfunction of cervical region: Secondary | ICD-10-CM | POA: Diagnosis not present

## 2014-11-29 DIAGNOSIS — M9902 Segmental and somatic dysfunction of thoracic region: Secondary | ICD-10-CM | POA: Diagnosis not present

## 2014-11-29 DIAGNOSIS — M5134 Other intervertebral disc degeneration, thoracic region: Secondary | ICD-10-CM | POA: Diagnosis not present

## 2014-11-30 ENCOUNTER — Encounter: Payer: Self-pay | Admitting: Internal Medicine

## 2014-11-30 ENCOUNTER — Ambulatory Visit (INDEPENDENT_AMBULATORY_CARE_PROVIDER_SITE_OTHER): Payer: Medicare Other | Admitting: Internal Medicine

## 2014-11-30 VITALS — BP 130/80 | HR 80 | Temp 98.2°F | Ht 61.75 in | Wt 165.0 lb

## 2014-11-30 DIAGNOSIS — M130 Polyarthritis, unspecified: Secondary | ICD-10-CM | POA: Diagnosis not present

## 2014-11-30 DIAGNOSIS — Z23 Encounter for immunization: Secondary | ICD-10-CM | POA: Diagnosis not present

## 2014-11-30 DIAGNOSIS — D510 Vitamin B12 deficiency anemia due to intrinsic factor deficiency: Secondary | ICD-10-CM | POA: Diagnosis not present

## 2014-11-30 DIAGNOSIS — E785 Hyperlipidemia, unspecified: Secondary | ICD-10-CM | POA: Diagnosis not present

## 2014-11-30 DIAGNOSIS — R251 Tremor, unspecified: Secondary | ICD-10-CM

## 2014-11-30 DIAGNOSIS — I10 Essential (primary) hypertension: Secondary | ICD-10-CM | POA: Diagnosis not present

## 2014-11-30 DIAGNOSIS — Z0000 Encounter for general adult medical examination without abnormal findings: Secondary | ICD-10-CM | POA: Diagnosis not present

## 2014-11-30 DIAGNOSIS — R197 Diarrhea, unspecified: Secondary | ICD-10-CM

## 2014-11-30 DIAGNOSIS — E538 Deficiency of other specified B group vitamins: Secondary | ICD-10-CM | POA: Diagnosis not present

## 2014-11-30 LAB — CBC WITH DIFFERENTIAL/PLATELET
BASOS ABS: 0 10*3/uL (ref 0.0–0.1)
BASOS PCT: 0.7 % (ref 0.0–3.0)
Eosinophils Absolute: 0.2 10*3/uL (ref 0.0–0.7)
Eosinophils Relative: 3.4 % (ref 0.0–5.0)
HCT: 42.3 % (ref 36.0–46.0)
Hemoglobin: 13.7 g/dL (ref 12.0–15.0)
LYMPHS ABS: 1.1 10*3/uL (ref 0.7–4.0)
Lymphocytes Relative: 24.3 % (ref 12.0–46.0)
MCHC: 32.3 g/dL (ref 30.0–36.0)
MCV: 95.8 fl (ref 78.0–100.0)
MONO ABS: 0.6 10*3/uL (ref 0.1–1.0)
Monocytes Relative: 12 % (ref 3.0–12.0)
NEUTROS ABS: 2.8 10*3/uL (ref 1.4–7.7)
Neutrophils Relative %: 59.6 % (ref 43.0–77.0)
Platelets: 227 10*3/uL (ref 150.0–400.0)
RBC: 4.42 Mil/uL (ref 3.87–5.11)
RDW: 14.3 % (ref 11.5–15.5)
WBC: 4.6 10*3/uL (ref 4.0–10.5)

## 2014-11-30 LAB — T4, FREE: Free T4: 0.73 ng/dL (ref 0.60–1.60)

## 2014-11-30 LAB — VITAMIN B12

## 2014-11-30 MED ORDER — CYANOCOBALAMIN 1000 MCG/ML IJ SOLN
1000.0000 ug | Freq: Once | INTRAMUSCULAR | Status: AC
  Administered 2014-11-30: 1000 ug via INTRAMUSCULAR

## 2014-11-30 MED ORDER — COLESTIPOL HCL 1 G PO TABS
2.0000 g | ORAL_TABLET | Freq: Every day | ORAL | Status: DC
Start: 2014-11-30 — End: 2015-09-30

## 2014-11-30 NOTE — Addendum Note (Signed)
Addended by: Despina Hidden on: 11/30/2014 09:40 AM   Modules accepted: Orders

## 2014-11-30 NOTE — Assessment & Plan Note (Signed)
I have personally reviewed the Medicare Annual Wellness questionnaire and have noted 1. The patient's medical and social history 2. Their use of alcohol, tobacco or illicit drugs 3. Their current medications and supplements 4. The patient's functional ability including ADL's, fall risks, home safety risks and hearing or visual             impairment. 5. Diet and physical activities 6. Evidence for depression or mood disorders  The patients weight, height, BMI and visual acuity have been recorded in the chart I have made referrals, counseling and provided education to the patient based review of the above and I have provided the pt with a written personalized care plan for preventive services.  I have provided you with a copy of your personalized plan for preventive services. Please take the time to review along with your updated medication list.  Will give prevnar today Discussed cancer screening--should just have 1 more mammogram at age 63 Discussed fitness

## 2014-11-30 NOTE — Assessment & Plan Note (Signed)
On colestipol for diarrhea Will not treat with statin

## 2014-11-30 NOTE — Progress Notes (Signed)
Pre visit review using our clinic review tool, if applicable. No additional management support is needed unless otherwise documented below in the visit note. 

## 2014-11-30 NOTE — Assessment & Plan Note (Signed)
Symptoms controlled with the colestipol

## 2014-11-30 NOTE — Progress Notes (Signed)
Subjective:    Patient ID: Mallory Rodriguez, female    DOB: 25-Jun-1933, 80 y.o.   MRN: [id number]  HPI Here for Medicare wellness and follow up Reviewed form and advanced directives No falls No depression or anhedonia Reviewed other physicians Intermittent wine--not daily. Not tobacco No regular exercise--discussed Independent with instrumental ADLs Vision okay. Has hearing aide No apparent cognitive problems  Daughter has noted that she shakes Husband hasn't seen anything Will have a little shaking with fine tasks Writing has gotten worse--- smaller No gait problems except up and down steps Was able to walk a lot while on cruise May be worse when tired--has noticed more fatigue in general  Still watches grandson every day--picks him up from school, etc  Chronic loose stools since gallbladder out Uses the cholestipol most days Occasionally takes imodium  Knee pain is better Doesn't need the dream cream now and rarely uses the ibuprofen  No chest pain No SOB No dizziness or syncope Edema is better now  No recent issues with anxiety Son was sent to [place] for the [employer]--- that worried her  Current Outpatient Prescriptions on File Prior to Visit  Medication Sig Dispense Refill  . Calcium-Vitamin D-Vitamin K (VIACTIV) 030-092-33 MG-UNT-MCG CHEW Chew 1 tablet by mouth daily.      . colestipol (COLESTID) 1 G tablet Take 2 tablets (2 g total) by mouth daily. 60 tablet 11  . Cranberry 200 MG CAPS Take 1 capsule by mouth daily.    . Cyanocobalamin (VITAMIN B-12 IJ) Inject as directed every 5 (five) weeks.    Marland Kitchen EPIPEN JR 2-PAK 0.15 MG/0.3ML injection INJECT 0.3MLS INTRAMUCCULARLY AS NEEDED FOR ANAPHYLAXIS 2 each 0  . fish oil-omega-3 fatty acids 1000 MG capsule Take 1 g by mouth daily.      . hydrochlorothiazide (HYDRODIURIL) 25 MG tablet TAKE ONE TABLET BY MOUTH DAILY. 30 tablet 9  . Multiple Vitamins-Minerals (CENTRUM SILVER PO) Take 1 tablet by mouth daily.    .  naproxen sodium (ANAPROX) 220 MG tablet Take 220 mg by mouth 2 (two) times daily with a meal.    . nitroGLYCERIN (NITROLINGUAL) 0.4 MG/SPRAY spray Place 1 spray under the tongue every 5 (five) minutes as needed for chest pain. 4.9 g 6  . Probiotic Product (PROBIOTIC DAILY) CAPS Take by mouth daily.     No current facility-administered medications on file prior to visit.    Allergies  Allergen Reactions  . Bee Venom Anaphylaxis  . Ace Inhibitors   . Contrast Media [Iodinated Diagnostic Agents]   . Epinephrine   . Hydrocodone   . Macrodantin   . Prednisone   . Sulfonamide Derivatives     Past Medical History  Diagnosis Date  . MVP (mitral valve prolapse)     and tricuspid leak  . AP (angina pectoris)   . Hiatal hernia   . Hypogammaglobulinemia     borderline  . Pelvic kidney     lower left side  . Sciatica   . Claustrophobia   . Diverticulosis of colon   . HLD (hyperlipidemia)   . Pernicious anemia   . HTN (hypertension)   . OA (osteoarthritis)     Past Surgical History  Procedure Laterality Date  . Appendectomy    . Tonsillectomy    . Cholecystectomy    . Right ovary and tube corpus    . Dilation and curettage of uterus    . Breast lumpectomy      Right (fatty tumor)  .  Inguinal hernia repair      Left side  . Excision vestibular glands    . Basal cell carcinoma removal      Back  . Intraocular lens insertion      Bilateral  . Film removed left eye    . Total abdominal hysterectomy    . Knee arthroscopy w/ partial medial meniscectomy  08/2009    right    Family History  Problem Relation Age of Onset  . Heart attack Father 6    CABG  . Dementia Mother   . Cancer Maternal Aunt     Breast-multiple aunts and Gm  . Cancer Maternal Uncle     Bladder and lung  . Diabetes Paternal Grandmother   . Hypertension Father   . Hypertension Brother     History   Social History  . Marital Status: Married    Spouse Name: N/A    Number of Children: 2  .  Years of Education: N/A   Occupational History  . Retired-bookkeeping/secretarial work     retired  .      Social History Main Topics  . Smoking status: Never Smoker   . Smokeless tobacco: Never Used  . Alcohol Use: Yes     Comment: Regular wine a few nights a week  . Drug Use: No  . Sexual Activity: Not on file   Other Topics Concern  . Not on file   Social History Narrative   Has living will and advance directives.    Requests husband, then daughter to make health care decisions.   Would accept resuscitation but no life prolonging technology if cognitively unaware    Review of Systems Weight is up a little--tends to get hungry in afternoon and have snack Sleeps fine Some stable nocturia--- no changes during the day Some easy bruising Sleeps okay    Objective:   Physical Exam  Constitutional: She is oriented to person, place, and time. She appears well-developed and well-nourished. No distress.  HENT:  Mouth/Throat: Oropharynx is clear and moist. No oropharyngeal exudate.  Neck: Normal range of motion. Neck supple. No thyromegaly present.  Cardiovascular: Normal rate, regular rhythm, normal heart sounds and intact distal pulses.  Exam reveals no gallop.   No murmur heard. Pulmonary/Chest: Effort normal and breath sounds normal. No respiratory distress. She has no wheezes. She has no rales.  Abdominal: Soft. There is no tenderness.  Musculoskeletal: She exhibits no edema or tenderness.  Lymphadenopathy:    She has no cervical adenopathy.  Neurological: She is alert and oriented to person, place, and time.  President--- "[facility], [place]" [phone] [id number] Recall 3/3  Very fine resting tremor No bradykinesia or stiffness Gait --steps slightly small  Skin: No rash noted.  Psychiatric: She has a normal mood and affect. Her behavior is normal.          Assessment & Plan:

## 2014-11-30 NOTE — Assessment & Plan Note (Signed)
Due for vitamin B12 today

## 2014-11-30 NOTE — Assessment & Plan Note (Signed)
BP Readings from Last 3 Encounters:  11/30/14 130/80  12/18/13 140/80  11/24/13 130/70   Good control Due for labs

## 2014-11-30 NOTE — Assessment & Plan Note (Signed)
Symptoms are consistent with very early Parkinson's No action needed If worsens will refer for neuro eval or trial of sinemet

## 2014-12-01 ENCOUNTER — Encounter: Payer: Self-pay | Admitting: *Deleted

## 2014-12-01 ENCOUNTER — Telehealth: Payer: Self-pay | Admitting: Internal Medicine

## 2014-12-01 LAB — COMPREHENSIVE METABOLIC PANEL
ALK PHOS: 50 U/L (ref 39–117)
ALT: 20 U/L (ref 0–35)
AST: 27 U/L (ref 0–37)
Albumin: 4.4 g/dL (ref 3.5–5.2)
BUN: 16 mg/dL (ref 6–23)
CHLORIDE: 104 meq/L (ref 96–112)
CO2: 25 mEq/L (ref 19–32)
CREATININE: 0.9 mg/dL (ref 0.4–1.2)
Calcium: 9.4 mg/dL (ref 8.4–10.5)
GFR: 64.2 mL/min (ref >60.00)
Glucose, Bld: 93 mg/dL (ref 70–99)
POTASSIUM: 4.1 meq/L (ref 3.5–5.1)
Sodium: 138 mEq/L (ref 135–145)
Total Bilirubin: 0.5 mg/dL (ref 0.2–1.2)
Total Protein: 7.3 g/dL (ref 6.0–8.3)

## 2014-12-01 LAB — LIPID PANEL
CHOL/HDL RATIO: 3
Cholesterol: 259 mg/dL — ABNORMAL HIGH (ref 0–200)
HDL: 90.2 mg/dL (ref >39.00)
LDL CALC: 150 mg/dL — AB (ref 0–99)
NONHDL: 168.8
Triglycerides: 94 mg/dL (ref 0.0–149.0)
VLDL: 18.8 mg/dL (ref 0.0–40.0)

## 2014-12-01 NOTE — Telephone Encounter (Signed)
[facility] MAILED  °

## 2014-12-02 ENCOUNTER — Ambulatory Visit: Payer: Medicare Other

## 2014-12-09 ENCOUNTER — Telehealth: Payer: Self-pay

## 2014-12-09 NOTE — Telephone Encounter (Signed)
PLEASE NOTE: All timestamps contained within this report are represented as [place] Standard Time. CONFIDENTIALTY NOTICE: This fax transmission is intended only for the addressee. It contains information that is legally privileged, confidential or otherwise protected from use or disclosure. If you are not the intended recipient, you are strictly prohibited from reviewing, disclosing, copying using or disseminating any of this information or taking any action in reliance on or regarding this information. If you have received this fax in error, please notify us immediately by telephone so that we can arrange for its return to [place]. Phone: [phone], Toll-Free: [phone], Fax: [phone] Page: 1 of 2 Call Id: [id number] [place] Patient Name: Mallory Rodriguez Gender: Female DOB: 1933/08/13 Age: 80 Y 5 D Return Phone Number: [phone] (Primary), [phone] (Secondary) Address: [address] City/State/Zip: [place] [address] Client [facility] - Client Client Site [facility] - Day Physician [name] Contact Type Call Call Type Triage / Clinical Relationship To Patient Self Return Phone Number [phone] (Primary) Chief Complaint Rash - Localized Initial Comment Caller states c/o rash on left arm where she had pneumonia shot 9 days ago PreDisposition Home Care Nurse Assessment Nurse: Vallery Sa, RN, [place] Date/Time (Eastern Time): 12/09/2014 9:27:05 AM Confirm and document reason for call. If symptomatic, describe symptoms. ---Caller states she developed a rash around her Pneumonia immunization area about 2-3 days ago. She had a Pneumonia immunization 9 days ago. No fever. No breathing difficulty. Has the patient traveled out of the country within the last 30 days? ---No Does the patient require triage? ---Yes Related visit  to physician within the last 2 weeks? ---Yes Does the PT have any chronic conditions? (i.e. diabetes, asthma, etc.) ---Yes List chronic conditions. ---Heart Murmur, Allergies Guidelines Guideline Title Affirmed Question Affirmed Notes Nurse Date/Time (Eastern Time) Rash or Redness - Localized [1] Looks infected (spreading redness, pus) AND [2] no fever Vallery Sa, RN, Cathy 12/09/2014 9:29:26 AM Disp. Time Eilene Ghazi Time) Disposition Final User 12/09/2014 8:43:13 AM Send To Clinical Follow Up Rich Brave, Amy 12/09/2014 9:32:22 AM See Physician within 24 Hours Yes [place], RN, Rosey Bath Understands: Yes Disagree/Comply: Comply PLEASE NOTE: All timestamps contained within this report are represented as [place] Standard Time. CONFIDENTIALTY NOTICE: This fax transmission is intended only for the addressee. It contains information that is legally privileged, confidential or otherwise protected from use or disclosure. If you are not the intended recipient, you are strictly prohibited from reviewing, disclosing, copying using or disseminating any of this information or taking any action in reliance on or regarding this information. If you have received this fax in error, please notify us immediately by telephone so that we can arrange for its return to [place]. Phone: [phone], Toll-Free: [phone], Fax: [phone] Page: 2 of 2 Call Id: [id number] Care Advice Given Per Guideline SEE PHYSICIAN WITHIN 24 HOURS: * IF OFFICE WILL BE OPEN: You need to be examined within the next 24 hours. Call your doctor when the office opens, and make an appointment. CLEANSING: Wash the infected area with warm water and an antibacterial soap 3 times a day. ANTIBIOTIC OINTMENT: Apply antibiotic ointment (OTC) to the infected area 3 times per day. CALL BACK IF: * Fever occurs * You become worse. CARE ADVICE given per Rash - Localized and Cause Unknown (Adult) guideline. After Care Instructions Given Call  Event Type User Date / Time Description Comments User: Berton Mount,  RN Date/Time Eilene Ghazi Time): 12/09/2014 9:36:04 AM Transferred to [facility] via the Appointment Line to check on appointment options. Referrals REFERRED TO PCP OFFICE

## 2014-12-09 NOTE — Telephone Encounter (Signed)
Pt has appt scheduled 12/10/14 at 7:15 with Dr Silvio Pate.

## 2014-12-09 NOTE — Telephone Encounter (Signed)
Unusual time frame but will check tomorrow

## 2014-12-10 ENCOUNTER — Ambulatory Visit (INDEPENDENT_AMBULATORY_CARE_PROVIDER_SITE_OTHER): Payer: Medicare Other | Admitting: Internal Medicine

## 2014-12-10 ENCOUNTER — Encounter: Payer: Self-pay | Admitting: Internal Medicine

## 2014-12-10 VITALS — BP 130/80 | HR 77 | Temp 98.1°F | Wt 167.0 lb

## 2014-12-10 DIAGNOSIS — T50A95A Adverse effect of other bacterial vaccines, initial encounter: Secondary | ICD-10-CM | POA: Diagnosis not present

## 2014-12-10 NOTE — Progress Notes (Signed)
Subjective:    Patient ID: Mallory Rodriguez, female    DOB: 06-21-1933, 80 y.o.   MRN: [id number]  HPI Here due to rash at site of pneumonia shot  Rash came out about 1 week after the shot Noticed redness and some warmth Did have mild reaction---did feel perfect (but nothing major)  She tried polysporin May be some lighter than last night Husband thinks it may be bigger  Current Outpatient Prescriptions on File Prior to Visit  Medication Sig Dispense Refill  . Calcium-Vitamin D-Vitamin K (VIACTIV) 295-188-41 MG-UNT-MCG CHEW Chew 1 tablet by mouth daily.      . colestipol (COLESTID) 1 G tablet Take 2 tablets (2 g total) by mouth daily. 60 tablet 11  . Cranberry 200 MG CAPS Take 1 capsule by mouth daily.    . Cyanocobalamin (VITAMIN B-12 IJ) Inject as directed every 5 (five) weeks.    Marland Kitchen EPIPEN JR 2-PAK 0.15 MG/0.3ML injection INJECT 0.3MLS INTRAMUCCULARLY AS NEEDED FOR ANAPHYLAXIS 2 each 0  . fish oil-omega-3 fatty acids 1000 MG capsule Take 1 g by mouth daily.      . hydrochlorothiazide (HYDRODIURIL) 25 MG tablet TAKE ONE TABLET BY MOUTH DAILY. 30 tablet 9  . Multiple Vitamins-Minerals (CENTRUM SILVER PO) Take 1 tablet by mouth daily.    . naproxen sodium (ANAPROX) 220 MG tablet Take 220 mg by mouth 2 (two) times daily with a meal.    . nitroGLYCERIN (NITROLINGUAL) 0.4 MG/SPRAY spray Place 1 spray under the tongue every 5 (five) minutes as needed for chest pain. 4.9 g 6  . Probiotic Product (PROBIOTIC DAILY) CAPS Take by mouth daily.     No current facility-administered medications on file prior to visit.    Allergies  Allergen Reactions  . Bee Venom Anaphylaxis  . Ace Inhibitors   . Contrast Media [Iodinated Diagnostic Agents]   . Epinephrine   . Hydrocodone   . Macrodantin   . Prednisone   . Sulfonamide Derivatives     Past Medical History  Diagnosis Date  . MVP (mitral valve prolapse)     and tricuspid leak  . AP (angina pectoris)   . Hiatal hernia   .  Hypogammaglobulinemia     borderline  . Pelvic kidney     lower left side  . Sciatica   . Claustrophobia   . Diverticulosis of colon   . HLD (hyperlipidemia)   . Pernicious anemia   . HTN (hypertension)   . OA (osteoarthritis)     Past Surgical History  Procedure Laterality Date  . Appendectomy    . Tonsillectomy    . Cholecystectomy    . Right ovary and tube corpus    . Dilation and curettage of uterus    . Breast lumpectomy      Right (fatty tumor)  . Inguinal hernia repair      Left side  . Excision vestibular glands    . Basal cell carcinoma removal      Back  . Intraocular lens insertion      Bilateral  . Film removed left eye    . Total abdominal hysterectomy    . Knee arthroscopy w/ partial medial meniscectomy  08/2009    right    Family History  Problem Relation Age of Onset  . Heart attack Father 65    CABG  . Dementia Mother   . Cancer Maternal Aunt     Breast-multiple aunts and Gm  . Cancer Maternal Uncle  Bladder and lung  . Diabetes Paternal Grandmother   . Hypertension Father   . Hypertension Brother     History   Social History  . Marital Status: Married    Spouse Name: N/A    Number of Children: 2  . Years of Education: N/A   Occupational History  . Retired-bookkeeping/secretarial work     retired  .      Social History Main Topics  . Smoking status: Never Smoker   . Smokeless tobacco: Never Used  . Alcohol Use: Yes     Comment: Regular wine a few nights a week  . Drug Use: No  . Sexual Activity: Not on file   Other Topics Concern  . Not on file   Social History Narrative   Has living will and advance directives.    Requests husband, then daughter to make health care decisions.   Would accept resuscitation but no life prolonging technology if cognitively unaware   Review of Systems No fever Hasn't felt sick    Objective:   Physical Exam  Constitutional: She appears well-developed. No distress.  Skin:  Roughly oval  8 x 3cm blanching redness in middle of upper left arm No tenderness or fluctuance Slightly warm only          Assessment & Plan:

## 2014-12-10 NOTE — Assessment & Plan Note (Signed)
Doesn't look infected Seems to be minor local reaction Reassured---should resolve over time No action needed

## 2014-12-10 NOTE — Progress Notes (Signed)
Pre visit review using our clinic review tool, if applicable. No additional management support is needed unless otherwise documented below in the visit note. 

## 2014-12-25 ENCOUNTER — Emergency Department: Payer: Self-pay | Admitting: Emergency Medicine

## 2014-12-25 DIAGNOSIS — R197 Diarrhea, unspecified: Secondary | ICD-10-CM | POA: Diagnosis not present

## 2014-12-25 DIAGNOSIS — E876 Hypokalemia: Secondary | ICD-10-CM | POA: Diagnosis not present

## 2014-12-25 DIAGNOSIS — K2970 Gastritis, unspecified, without bleeding: Secondary | ICD-10-CM | POA: Diagnosis not present

## 2014-12-25 DIAGNOSIS — R1110 Vomiting, unspecified: Secondary | ICD-10-CM | POA: Diagnosis not present

## 2014-12-25 DIAGNOSIS — N390 Urinary tract infection, site not specified: Secondary | ICD-10-CM | POA: Diagnosis not present

## 2014-12-25 DIAGNOSIS — R112 Nausea with vomiting, unspecified: Secondary | ICD-10-CM | POA: Diagnosis not present

## 2014-12-25 LAB — CBC WITH DIFFERENTIAL/PLATELET
BASOS PCT: 0.4 %
Basophil #: 0 10*3/uL (ref 0.0–0.1)
EOS ABS: 0 10*3/uL (ref 0.0–0.7)
EOS PCT: 0.5 %
HCT: 42.2 % (ref 35.0–47.0)
HGB: 14.1 g/dL (ref 12.0–16.0)
LYMPHS ABS: 0.5 10*3/uL — AB (ref 1.0–3.6)
LYMPHS PCT: 6.5 %
MCH: 31.9 pg (ref 26.0–34.0)
MCHC: 33.4 g/dL (ref 32.0–36.0)
MCV: 96 fL (ref 80–100)
Monocyte #: 0.8 x10 3/mm (ref 0.2–0.9)
Monocyte %: 10.5 %
NEUTROS ABS: 6.3 10*3/uL (ref 1.4–6.5)
Neutrophil %: 82.1 %
Platelet: 204 10*3/uL (ref 150–440)
RBC: 4.41 10*6/uL (ref 3.80–5.20)
RDW: 13.5 % (ref 11.5–14.5)
WBC: 7.7 10*3/uL (ref 3.6–11.0)

## 2014-12-25 LAB — COMPREHENSIVE METABOLIC PANEL
ALK PHOS: 63 U/L
ANION GAP: 12 (ref 7–16)
AST: 37 U/L (ref 15–37)
Albumin: 4 g/dL (ref 3.4–5.0)
BUN: 21 mg/dL — ABNORMAL HIGH (ref 7–18)
Bilirubin,Total: 0.4 mg/dL (ref 0.2–1.0)
CALCIUM: 8.6 mg/dL (ref 8.5–10.1)
CREATININE: 1.05 mg/dL (ref 0.60–1.30)
Chloride: 103 mmol/L (ref 98–107)
Co2: 24 mmol/L (ref 21–32)
EGFR (African American): >60
EGFR (Non-African Amer.): 54 — ABNORMAL LOW
Glucose: 107 mg/dL — ABNORMAL HIGH (ref 65–99)
Osmolality: 281 (ref 275–301)
POTASSIUM: 2.8 mmol/L — AB (ref 3.5–5.1)
SGPT (ALT): 31 U/L
SODIUM: 139 mmol/L (ref 136–145)
Total Protein: 7.9 g/dL (ref 6.4–8.2)

## 2014-12-25 LAB — LIPASE, BLOOD: Lipase: 109 U/L (ref 73–393)

## 2014-12-25 LAB — TROPONIN I: Troponin-I: <0.02 ng/mL

## 2014-12-26 LAB — URINALYSIS, COMPLETE
BILIRUBIN, UR: NEGATIVE
Blood: NEGATIVE
GLUCOSE, UR: NEGATIVE mg/dL (ref 0–75)
Hyaline Cast: 7
Nitrite: NEGATIVE
Ph: 5 (ref 4.5–8.0)
Protein: 30
RBC,UR: 4 /HPF (ref 0–5)
SPECIFIC GRAVITY: 1.027 (ref 1.003–1.030)
Squamous Epithelial: 13

## 2014-12-28 ENCOUNTER — Telehealth: Payer: Self-pay | Admitting: *Deleted

## 2014-12-28 NOTE — Telephone Encounter (Signed)
Good to hear

## 2014-12-28 NOTE — Telephone Encounter (Signed)
Called pt to follow-up on the call from 12/25/2014 and per pt she went to [facility] and was diagnosed with the stomach flu, pt states she feels better and she does not need a follow-up at this time, pt states she had diarrhea, vomiting and was dehydrated. I will send for ED notes.  ( I will have the fax from [facility] scanned in)

## 2015-01-03 ENCOUNTER — Telehealth: Payer: Self-pay | Admitting: Internal Medicine

## 2015-01-03 DIAGNOSIS — R51 Headache: Secondary | ICD-10-CM | POA: Diagnosis not present

## 2015-01-03 DIAGNOSIS — M9901 Segmental and somatic dysfunction of cervical region: Secondary | ICD-10-CM | POA: Diagnosis not present

## 2015-01-03 DIAGNOSIS — M9902 Segmental and somatic dysfunction of thoracic region: Secondary | ICD-10-CM | POA: Diagnosis not present

## 2015-01-03 DIAGNOSIS — M5134 Other intervertebral disc degeneration, thoracic region: Secondary | ICD-10-CM | POA: Diagnosis not present

## 2015-01-03 NOTE — Telephone Encounter (Signed)
ER records show only 10WBC/HPF Not clear that she had infection Find out if they did culture--and if so, did it grow anything?  Let her know I am not sure she had a UTI--may have just been bladder infection or something else. If the symptoms persist, we will have to recheck her

## 2015-01-03 NOTE — Telephone Encounter (Signed)
Pt was seen 12/25/14 at [facility] ER for bladder infection. They gave her cipro and she finished abx Saturday 01/01/15. She would like to know if she needs her urine rechecked to see if infection is all gone? She states she still has some pressure.  #[phone]

## 2015-01-03 NOTE — Telephone Encounter (Signed)
Pt states she feels better but still weak, she will call if things get worse.

## 2015-01-04 ENCOUNTER — Ambulatory Visit (INDEPENDENT_AMBULATORY_CARE_PROVIDER_SITE_OTHER): Payer: Medicare Other

## 2015-01-04 DIAGNOSIS — E538 Deficiency of other specified B group vitamins: Secondary | ICD-10-CM | POA: Diagnosis not present

## 2015-01-04 MED ORDER — CYANOCOBALAMIN 1000 MCG/ML IJ SOLN
1000.0000 ug | Freq: Once | INTRAMUSCULAR | Status: AC
  Administered 2015-01-04: 1000 ug via INTRAMUSCULAR

## 2015-01-07 ENCOUNTER — Encounter: Payer: Self-pay | Admitting: Internal Medicine

## 2015-01-31 DIAGNOSIS — M9901 Segmental and somatic dysfunction of cervical region: Secondary | ICD-10-CM | POA: Diagnosis not present

## 2015-01-31 DIAGNOSIS — M9902 Segmental and somatic dysfunction of thoracic region: Secondary | ICD-10-CM | POA: Diagnosis not present

## 2015-01-31 DIAGNOSIS — M5134 Other intervertebral disc degeneration, thoracic region: Secondary | ICD-10-CM | POA: Diagnosis not present

## 2015-01-31 DIAGNOSIS — R51 Headache: Secondary | ICD-10-CM | POA: Diagnosis not present

## 2015-02-09 ENCOUNTER — Ambulatory Visit (INDEPENDENT_AMBULATORY_CARE_PROVIDER_SITE_OTHER): Payer: Medicare Other

## 2015-02-09 DIAGNOSIS — E538 Deficiency of other specified B group vitamins: Secondary | ICD-10-CM | POA: Diagnosis not present

## 2015-02-09 MED ORDER — CYANOCOBALAMIN 1000 MCG/ML IJ SOLN
1000.0000 ug | Freq: Once | INTRAMUSCULAR | Status: AC
  Administered 2015-02-09: 1000 ug via INTRAMUSCULAR

## 2015-02-11 ENCOUNTER — Encounter: Payer: Self-pay | Admitting: Cardiovascular Disease

## 2015-02-11 ENCOUNTER — Ambulatory Visit (INDEPENDENT_AMBULATORY_CARE_PROVIDER_SITE_OTHER): Payer: Medicare Other | Admitting: Cardiovascular Disease

## 2015-02-11 VITALS — BP 120/72 | HR 94 | Ht 62.0 in | Wt 163.5 lb

## 2015-02-11 DIAGNOSIS — E785 Hyperlipidemia, unspecified: Secondary | ICD-10-CM | POA: Diagnosis not present

## 2015-02-11 DIAGNOSIS — I10 Essential (primary) hypertension: Secondary | ICD-10-CM | POA: Diagnosis not present

## 2015-02-11 DIAGNOSIS — Z8249 Family history of ischemic heart disease and other diseases of the circulatory system: Secondary | ICD-10-CM | POA: Diagnosis not present

## 2015-02-11 DIAGNOSIS — R079 Chest pain, unspecified: Secondary | ICD-10-CM | POA: Diagnosis not present

## 2015-02-11 DIAGNOSIS — I059 Rheumatic mitral valve disease, unspecified: Secondary | ICD-10-CM

## 2015-02-11 NOTE — Assessment & Plan Note (Signed)
Prior history of mitral valve regurgitation. No significant murmur appreciated on today's visit

## 2015-02-11 NOTE — Assessment & Plan Note (Signed)
Blood pressure is well controlled on today's visit. No changes made to the medications. 

## 2015-02-11 NOTE — Patient Instructions (Addendum)
You are doing well. No medication changes were made.  Please start a walking program  We will schedule a CT calcium score in [place] Please arrive @ 8:15 am on Tuesday, Feb 23 There is a one time fee of $150 due at the time of your procedure  Please call us if you have new issues that need to be addressed before your next appt.  Your physician wants you to follow-up in: 12 months.  You will receive a reminder letter in the mail two months in advance. If you don't receive a letter, please call our office to schedule the follow-up appointment.
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
## 2015-02-11 NOTE — Assessment & Plan Note (Signed)
Cholesterol is elevated. We have discussed this with her. She does not really want to start a medication if not needed. We discussed various options for evaluation of her underlying coronary disease. After discussion, we will schedule a coronary calcium score in [place]. If this is markedly elevated, suggested we treat her cholesterol more aggressively. If low, would continue diet and exercise

## 2015-02-22 ENCOUNTER — Ambulatory Visit (INDEPENDENT_AMBULATORY_CARE_PROVIDER_SITE_OTHER)
Admission: RE | Admit: 2015-02-22 | Discharge: 2015-02-22 | Disposition: A | Discharge status: Home / Self Care | Payer: Medicare Other | Source: Ambulatory Visit | Attending: Cardiovascular Disease | Admitting: Cardiovascular Disease

## 2015-02-22 DIAGNOSIS — R079 Chest pain, unspecified: Secondary | ICD-10-CM

## 2015-02-22 DIAGNOSIS — E785 Hyperlipidemia, unspecified: Secondary | ICD-10-CM

## 2015-02-22 DIAGNOSIS — Z8249 Family history of ischemic heart disease and other diseases of the circulatory system: Secondary | ICD-10-CM

## 2015-03-01 DIAGNOSIS — M9901 Segmental and somatic dysfunction of cervical region: Secondary | ICD-10-CM | POA: Diagnosis not present

## 2015-03-01 DIAGNOSIS — R51 Headache: Secondary | ICD-10-CM | POA: Diagnosis not present

## 2015-03-01 DIAGNOSIS — M5134 Other intervertebral disc degeneration, thoracic region: Secondary | ICD-10-CM | POA: Diagnosis not present

## 2015-03-01 DIAGNOSIS — M9902 Segmental and somatic dysfunction of thoracic region: Secondary | ICD-10-CM | POA: Diagnosis not present

## 2015-03-16 ENCOUNTER — Ambulatory Visit (INDEPENDENT_AMBULATORY_CARE_PROVIDER_SITE_OTHER): Payer: Medicare Other

## 2015-03-16 DIAGNOSIS — E538 Deficiency of other specified B group vitamins: Secondary | ICD-10-CM

## 2015-03-16 MED ORDER — CYANOCOBALAMIN 1000 MCG/ML IJ SOLN
1000.0000 ug | Freq: Once | INTRAMUSCULAR | Status: AC
  Administered 2015-03-16: 1000 ug via INTRAMUSCULAR

## 2015-04-05 ENCOUNTER — Other Ambulatory Visit: Payer: Self-pay | Admitting: Internal Medicine

## 2015-04-05 DIAGNOSIS — M5134 Other intervertebral disc degeneration, thoracic region: Secondary | ICD-10-CM | POA: Diagnosis not present

## 2015-04-05 DIAGNOSIS — M9901 Segmental and somatic dysfunction of cervical region: Secondary | ICD-10-CM | POA: Diagnosis not present

## 2015-04-05 DIAGNOSIS — M9902 Segmental and somatic dysfunction of thoracic region: Secondary | ICD-10-CM | POA: Diagnosis not present

## 2015-04-05 DIAGNOSIS — R51 Headache: Secondary | ICD-10-CM | POA: Diagnosis not present

## 2015-04-20 ENCOUNTER — Ambulatory Visit (INDEPENDENT_AMBULATORY_CARE_PROVIDER_SITE_OTHER): Payer: Medicare Other | Admitting: *Deleted

## 2015-04-20 DIAGNOSIS — E538 Deficiency of other specified B group vitamins: Secondary | ICD-10-CM

## 2015-04-20 MED ORDER — CYANOCOBALAMIN 1000 MCG/ML IJ SOLN
1000.0000 ug | Freq: Once | INTRAMUSCULAR | Status: AC
  Administered 2015-04-20: 1000 ug via INTRAMUSCULAR

## 2015-05-03 DIAGNOSIS — M5134 Other intervertebral disc degeneration, thoracic region: Secondary | ICD-10-CM | POA: Diagnosis not present

## 2015-05-03 DIAGNOSIS — M9902 Segmental and somatic dysfunction of thoracic region: Secondary | ICD-10-CM | POA: Diagnosis not present

## 2015-05-03 DIAGNOSIS — R51 Headache: Secondary | ICD-10-CM | POA: Diagnosis not present

## 2015-05-03 DIAGNOSIS — M9901 Segmental and somatic dysfunction of cervical region: Secondary | ICD-10-CM | POA: Diagnosis not present

## 2015-05-20 DIAGNOSIS — Z961 Presence of intraocular lens: Secondary | ICD-10-CM | POA: Diagnosis not present

## 2015-05-25 ENCOUNTER — Telehealth: Payer: Self-pay

## 2015-05-25 ENCOUNTER — Ambulatory Visit: Payer: Medicare Other

## 2015-05-25 NOTE — Telephone Encounter (Signed)
Pt was scheduled for B12 injection today---I always see when the last labs were done and found that last labs from 12/01/2014 showed pt's B12 levels were >1500---I did let pt know and showed her a copy of labs, so pt declined to get injection today. I told pt that i would send you a message about this and you will let her know how to proceed--please advise

## 2015-05-25 NOTE — Telephone Encounter (Signed)
.  left message to have patient return my call.  

## 2015-05-25 NOTE — Telephone Encounter (Signed)
If that was done after she had a shot--it doesn't mean she doesn't still need them. If would be fine to decrease and only do them every 3 months or so--but she probably should continue. If she wants to try a daily sublingual 1042mcg --we can then check blood work in about 3 months and see if that keeps her levels okay

## 2015-05-31 DIAGNOSIS — M5134 Other intervertebral disc degeneration, thoracic region: Secondary | ICD-10-CM | POA: Diagnosis not present

## 2015-05-31 DIAGNOSIS — M9901 Segmental and somatic dysfunction of cervical region: Secondary | ICD-10-CM | POA: Diagnosis not present

## 2015-05-31 DIAGNOSIS — R51 Headache: Secondary | ICD-10-CM | POA: Diagnosis not present

## 2015-05-31 DIAGNOSIS — M9902 Segmental and somatic dysfunction of thoracic region: Secondary | ICD-10-CM | POA: Diagnosis not present

## 2015-06-03 NOTE — Telephone Encounter (Signed)
Spoke with patient and she thinks our lab is wrong, pt states she's been below normal for years and will come every 3 mths to get injections.

## 2015-06-21 DIAGNOSIS — R51 Headache: Secondary | ICD-10-CM | POA: Diagnosis not present

## 2015-06-21 DIAGNOSIS — M5134 Other intervertebral disc degeneration, thoracic region: Secondary | ICD-10-CM | POA: Diagnosis not present

## 2015-06-21 DIAGNOSIS — M9902 Segmental and somatic dysfunction of thoracic region: Secondary | ICD-10-CM | POA: Diagnosis not present

## 2015-06-21 DIAGNOSIS — M9901 Segmental and somatic dysfunction of cervical region: Secondary | ICD-10-CM | POA: Diagnosis not present

## 2015-07-10 ENCOUNTER — Other Ambulatory Visit: Payer: Self-pay | Admitting: Internal Medicine

## 2015-07-12 DIAGNOSIS — M9902 Segmental and somatic dysfunction of thoracic region: Secondary | ICD-10-CM | POA: Diagnosis not present

## 2015-07-12 DIAGNOSIS — M5134 Other intervertebral disc degeneration, thoracic region: Secondary | ICD-10-CM | POA: Diagnosis not present

## 2015-07-12 DIAGNOSIS — R51 Headache: Secondary | ICD-10-CM | POA: Diagnosis not present

## 2015-07-12 DIAGNOSIS — M9901 Segmental and somatic dysfunction of cervical region: Secondary | ICD-10-CM | POA: Diagnosis not present

## 2015-07-18 DIAGNOSIS — D2339 Other benign neoplasm of skin of other parts of face: Secondary | ICD-10-CM | POA: Diagnosis not present

## 2015-07-18 DIAGNOSIS — L821 Other seborrheic keratosis: Secondary | ICD-10-CM | POA: Diagnosis not present

## 2015-07-18 DIAGNOSIS — L820 Inflamed seborrheic keratosis: Secondary | ICD-10-CM | POA: Diagnosis not present

## 2015-07-18 DIAGNOSIS — L570 Actinic keratosis: Secondary | ICD-10-CM | POA: Diagnosis not present

## 2015-07-25 ENCOUNTER — Other Ambulatory Visit: Payer: Self-pay | Admitting: Internal Medicine

## 2015-08-01 ENCOUNTER — Emergency Department: Payer: Medicare Other

## 2015-08-01 ENCOUNTER — Observation Stay: Payer: Medicare Other

## 2015-08-01 ENCOUNTER — Observation Stay
Admission: EM | Admit: 2015-08-01 | Discharge: 2015-08-02 | Disposition: A | Discharge status: Home / Self Care | Payer: Medicare Other | Attending: Internal Medicine | Admitting: Internal Medicine

## 2015-08-01 ENCOUNTER — Observation Stay (HOSPITAL_BASED_OUTPATIENT_CLINIC_OR_DEPARTMENT_OTHER)
Admit: 2015-08-01 | Discharge: 2015-08-01 | Disposition: A | Discharge status: Home / Self Care | Payer: Medicare Other | Attending: Internal Medicine | Admitting: Internal Medicine

## 2015-08-01 DIAGNOSIS — K5730 Diverticulosis of large intestine without perforation or abscess without bleeding: Secondary | ICD-10-CM | POA: Diagnosis not present

## 2015-08-01 DIAGNOSIS — I10 Essential (primary) hypertension: Secondary | ICD-10-CM | POA: Insufficient documentation

## 2015-08-01 DIAGNOSIS — R531 Weakness: Secondary | ICD-10-CM | POA: Insufficient documentation

## 2015-08-01 DIAGNOSIS — Z91041 Radiographic dye allergy status: Secondary | ICD-10-CM | POA: Diagnosis not present

## 2015-08-01 DIAGNOSIS — R410 Disorientation, unspecified: Secondary | ICD-10-CM | POA: Diagnosis not present

## 2015-08-01 DIAGNOSIS — M5430 Sciatica, unspecified side: Secondary | ICD-10-CM | POA: Insufficient documentation

## 2015-08-01 DIAGNOSIS — I209 Angina pectoris, unspecified: Secondary | ICD-10-CM | POA: Diagnosis not present

## 2015-08-01 DIAGNOSIS — Z8673 Personal history of transient ischemic attack (TIA), and cerebral infarction without residual deficits: Secondary | ICD-10-CM | POA: Diagnosis not present

## 2015-08-01 DIAGNOSIS — Z91030 Bee allergy status: Secondary | ICD-10-CM | POA: Insufficient documentation

## 2015-08-01 DIAGNOSIS — E785 Hyperlipidemia, unspecified: Secondary | ICD-10-CM | POA: Diagnosis not present

## 2015-08-01 DIAGNOSIS — E876 Hypokalemia: Secondary | ICD-10-CM | POA: Insufficient documentation

## 2015-08-01 DIAGNOSIS — F40240 Claustrophobia: Secondary | ICD-10-CM | POA: Insufficient documentation

## 2015-08-01 DIAGNOSIS — J301 Allergic rhinitis due to pollen: Secondary | ICD-10-CM | POA: Diagnosis not present

## 2015-08-01 DIAGNOSIS — Z833 Family history of diabetes mellitus: Secondary | ICD-10-CM | POA: Diagnosis not present

## 2015-08-01 DIAGNOSIS — I6523 Occlusion and stenosis of bilateral carotid arteries: Secondary | ICD-10-CM | POA: Diagnosis not present

## 2015-08-01 DIAGNOSIS — K449 Diaphragmatic hernia without obstruction or gangrene: Secondary | ICD-10-CM | POA: Diagnosis not present

## 2015-08-01 DIAGNOSIS — Z8249 Family history of ischemic heart disease and other diseases of the circulatory system: Secondary | ICD-10-CM | POA: Diagnosis not present

## 2015-08-01 DIAGNOSIS — Z885 Allergy status to narcotic agent status: Secondary | ICD-10-CM | POA: Insufficient documentation

## 2015-08-01 DIAGNOSIS — M1990 Unspecified osteoarthritis, unspecified site: Secondary | ICD-10-CM | POA: Diagnosis not present

## 2015-08-01 DIAGNOSIS — Z9049 Acquired absence of other specified parts of digestive tract: Secondary | ICD-10-CM | POA: Insufficient documentation

## 2015-08-01 DIAGNOSIS — Z888 Allergy status to other drugs, medicaments and biological substances status: Secondary | ICD-10-CM | POA: Insufficient documentation

## 2015-08-01 DIAGNOSIS — Z801 Family history of malignant neoplasm of trachea, bronchus and lung: Secondary | ICD-10-CM | POA: Diagnosis not present

## 2015-08-01 DIAGNOSIS — Z803 Family history of malignant neoplasm of breast: Secondary | ICD-10-CM | POA: Diagnosis not present

## 2015-08-01 DIAGNOSIS — L570 Actinic keratosis: Secondary | ICD-10-CM | POA: Insufficient documentation

## 2015-08-01 DIAGNOSIS — D510 Vitamin B12 deficiency anemia due to intrinsic factor deficiency: Secondary | ICD-10-CM | POA: Diagnosis not present

## 2015-08-01 DIAGNOSIS — R4781 Slurred speech: Secondary | ICD-10-CM | POA: Diagnosis not present

## 2015-08-01 DIAGNOSIS — Z881 Allergy status to other antibiotic agents status: Secondary | ICD-10-CM | POA: Insufficient documentation

## 2015-08-01 DIAGNOSIS — Z882 Allergy status to sulfonamides status: Secondary | ICD-10-CM | POA: Insufficient documentation

## 2015-08-01 DIAGNOSIS — Z8052 Family history of malignant neoplasm of bladder: Secondary | ICD-10-CM | POA: Insufficient documentation

## 2015-08-01 DIAGNOSIS — R471 Dysarthria and anarthria: Secondary | ICD-10-CM | POA: Diagnosis not present

## 2015-08-01 DIAGNOSIS — G459 Transient cerebral ischemic attack, unspecified: Secondary | ICD-10-CM | POA: Diagnosis present

## 2015-08-01 DIAGNOSIS — I340 Nonrheumatic mitral (valve) insufficiency: Secondary | ICD-10-CM | POA: Diagnosis not present

## 2015-08-01 DIAGNOSIS — H269 Unspecified cataract: Secondary | ICD-10-CM | POA: Insufficient documentation

## 2015-08-01 DIAGNOSIS — I351 Nonrheumatic aortic (valve) insufficiency: Secondary | ICD-10-CM | POA: Diagnosis not present

## 2015-08-01 DIAGNOSIS — Z818 Family history of other mental and behavioral disorders: Secondary | ICD-10-CM | POA: Diagnosis not present

## 2015-08-01 DIAGNOSIS — Z90710 Acquired absence of both cervix and uterus: Secondary | ICD-10-CM | POA: Diagnosis not present

## 2015-08-01 HISTORY — DX: Transient cerebral ischemic attack, unspecified: G45.9

## 2015-08-01 LAB — PROTIME-INR
INR: 0.99
Prothrombin Time: 13.3 seconds (ref 11.4–15.0)

## 2015-08-01 LAB — CBC
HEMATOCRIT: 41 % (ref 35.0–47.0)
HEMOGLOBIN: 13.4 g/dL (ref 12.0–16.0)
MCH: 31.1 pg (ref 26.0–34.0)
MCHC: 32.7 g/dL (ref 32.0–36.0)
MCV: 95.1 fL (ref 80.0–100.0)
PLATELETS: 212 10*3/uL (ref 150–440)
RBC: 4.31 MIL/uL (ref 3.80–5.20)
RDW: 13.3 % (ref 11.5–14.5)
WBC: 5.9 10*3/uL (ref 3.6–11.0)

## 2015-08-01 LAB — COMPREHENSIVE METABOLIC PANEL
ALBUMIN: 4.4 g/dL (ref 3.5–5.0)
ALK PHOS: 51 U/L (ref 38–126)
ALT: 24 U/L (ref 14–54)
AST: 34 U/L (ref 15–41)
Anion gap: 11 (ref 5–15)
BUN: 23 mg/dL — ABNORMAL HIGH (ref 6–20)
CALCIUM: 9.2 mg/dL (ref 8.9–10.3)
CO2: 25 mmol/L (ref 22–32)
CREATININE: 1.08 mg/dL — AB (ref 0.44–1.00)
Chloride: 101 mmol/L (ref 101–111)
GFR calc Af Amer: 55 mL/min — ABNORMAL LOW (ref >60)
GFR calc non Af Amer: 48 mL/min — ABNORMAL LOW (ref >60)
GLUCOSE: 137 mg/dL — AB (ref 65–99)
Potassium: 3.2 mmol/L — ABNORMAL LOW (ref 3.5–5.1)
Sodium: 137 mmol/L (ref 135–145)
Total Bilirubin: 0.6 mg/dL (ref 0.3–1.2)
Total Protein: 7.6 g/dL (ref 6.5–8.1)

## 2015-08-01 LAB — DIFFERENTIAL
BASOS PCT: 1 %
Basophils Absolute: 0.1 10*3/uL (ref 0–0.1)
Eosinophils Absolute: 0.1 10*3/uL (ref 0–0.7)
Eosinophils Relative: 2 %
LYMPHS ABS: 1.1 10*3/uL (ref 1.0–3.6)
LYMPHS PCT: 19 %
MONO ABS: 0.7 10*3/uL (ref 0.2–0.9)
MONOS PCT: 12 %
NEUTROS ABS: 3.9 10*3/uL (ref 1.4–6.5)
Neutrophils Relative %: 66 %

## 2015-08-01 LAB — URINALYSIS COMPLETE WITH MICROSCOPIC (ARMC ONLY)
Bacteria, UA: NONE SEEN
Bilirubin Urine: NEGATIVE
Glucose, UA: NEGATIVE mg/dL
Hgb urine dipstick: NEGATIVE
Ketones, ur: NEGATIVE mg/dL
Leukocytes, UA: NEGATIVE
Nitrite: NEGATIVE
PROTEIN: NEGATIVE mg/dL
RBC / HPF: NONE SEEN RBC/hpf (ref 0–5)
Specific Gravity, Urine: 1.011 (ref 1.005–1.030)
pH: 6 (ref 5.0–8.0)

## 2015-08-01 LAB — TROPONIN I

## 2015-08-01 LAB — APTT: aPTT: 26 seconds (ref 24–36)

## 2015-08-01 MED ORDER — HYDROCHLOROTHIAZIDE 25 MG PO TABS
25.0000 mg | ORAL_TABLET | Freq: Every day | ORAL | Status: DC
  Administered 2015-08-02: 09:00:00 25 mg via ORAL
  Filled 2015-08-01 (×2): qty 1

## 2015-08-01 MED ORDER — ACETAMINOPHEN 650 MG RE SUPP: 650.0000 mg | Freq: Four times a day (QID) | RECTAL | Status: DC | PRN

## 2015-08-01 MED ORDER — POTASSIUM CHLORIDE 20 MEQ PO PACK
40.0000 meq | PACK | Freq: Once | ORAL | Status: AC
  Administered 2015-08-01: 40 meq via ORAL

## 2015-08-01 MED ORDER — COLESTIPOL HCL 1 G PO TABS
2.0000 g | ORAL_TABLET | Freq: Every day | ORAL | Status: DC
  Administered 2015-08-01 – 2015-08-02 (×2): 2 g via ORAL
  Filled 2015-08-01 (×2): qty 2

## 2015-08-01 MED ORDER — ZOLPIDEM TARTRATE 5 MG PO TABS: 5.0000 mg | ORAL_TABLET | Freq: Every evening | ORAL | Status: DC | PRN

## 2015-08-01 MED ORDER — NITROGLYCERIN 0.4 MG SL SUBL: 0.4000 mg | SUBLINGUAL_TABLET | SUBLINGUAL | Status: DC | PRN

## 2015-08-01 MED ORDER — DOCUSATE SODIUM 100 MG PO CAPS
100.0000 mg | ORAL_CAPSULE | Freq: Two times a day (BID) | ORAL | Status: DC
  Filled 2015-08-01 (×2): qty 1

## 2015-08-01 MED ORDER — ASPIRIN EC 325 MG PO TBEC
325.0000 mg | DELAYED_RELEASE_TABLET | Freq: Every day | ORAL | Status: DC
  Administered 2015-08-02: 09:00:00 325 mg via ORAL
  Filled 2015-08-01: qty 1

## 2015-08-01 MED ORDER — POTASSIUM CHLORIDE 20 MEQ PO PACK
40.0000 meq | PACK | Freq: Once | ORAL | Status: DC
  Administered 2015-08-01: 40 meq via ORAL
  Filled 2015-08-01: qty 2

## 2015-08-01 MED ORDER — ENOXAPARIN SODIUM 40 MG/0.4ML ~~LOC~~ SOLN
40.0000 mg | SUBCUTANEOUS | Status: DC
  Administered 2015-08-01: 18:00:00 40 mg via SUBCUTANEOUS
  Filled 2015-08-01: qty 0.4

## 2015-08-01 MED ORDER — ONDANSETRON HCL 4 MG/2ML IJ SOLN: 4.0000 mg | Freq: Four times a day (QID) | INTRAMUSCULAR | Status: DC | PRN

## 2015-08-01 MED ORDER — NITROGLYCERIN 0.4 MG/SPRAY TL SOLN: 1.0000 | Status: DC | PRN

## 2015-08-01 MED ORDER — ONDANSETRON HCL 4 MG PO TABS: 4.0000 mg | ORAL_TABLET | Freq: Four times a day (QID) | ORAL | Status: DC | PRN

## 2015-08-01 MED ORDER — SODIUM CHLORIDE 0.9 % IJ SOLN
3.0000 mL | Freq: Two times a day (BID) | INTRAMUSCULAR | Status: DC
  Administered 2015-08-01 – 2015-08-02 (×3): 3 mL via INTRAVENOUS

## 2015-08-01 MED ORDER — ASPIRIN 81 MG PO CHEW
324.0000 mg | CHEWABLE_TABLET | Freq: Once | ORAL | Status: AC
  Administered 2015-08-01: 324 mg via ORAL
  Filled 2015-08-01: qty 4

## 2015-08-01 MED ORDER — POTASSIUM CHLORIDE 10 MEQ/100ML IV SOLN
10.0000 meq | INTRAVENOUS | Status: DC
  Filled 2015-08-01 (×4): qty 100

## 2015-08-01 MED ORDER — ACETAMINOPHEN 325 MG PO TABS: 650.0000 mg | ORAL_TABLET | Freq: Four times a day (QID) | ORAL | Status: DC | PRN

## 2015-08-01 MED ORDER — IBUPROFEN 200 MG PO TABS: 200.0000 mg | ORAL_TABLET | Freq: Four times a day (QID) | ORAL | Status: DC | PRN

## 2015-08-01 MED ORDER — DIPHENHYDRAMINE HCL 12.5 MG/5ML PO ELIX: 37.5000 mg | ORAL_SOLUTION | Freq: Four times a day (QID) | ORAL | Status: DC | PRN

## 2015-08-01 NOTE — Plan of Care (Signed)
Problem: Discharge/Transitional Outcomes Goal: Other Discharge Outcomes/Goals Outcome: Progressing Plan of Care Progress to Goal:  Pt admitted w/possible TIA.  Pt had period when she was unable to speak.  Currently has 0 deficits remaining.  Ambulates well, no speech deficits.  No c/o headache or blurry vision.  Daughter witnessed inability to speak. CT of head was negative.  [place] of carotids done and results not in yet. Will continue to monitor. Pt planning on going on vacation this week.

## 2015-08-01 NOTE — ED Notes (Signed)
Pt reports dizziness and difficulty talking. Daughter reports pt had slurred speech, garbled speech. EMS saw pt earlier today, pt refused to be seen. Pt now with increased dizziness. Pt reports she was unable to get "the words out".

## 2015-08-01 NOTE — H&P (Signed)
[facility] [place] NAME: Mallory Rodriguez    MR#:  [id number]  DATE OF BIRTH:  17-Oct-1933  DATE OF ADMISSION:  08/01/2015  PRIMARY CARE PHYSICIAN: [name], MD   REQUESTING/REFERRING PHYSICIAN: Dr.Gayle  CHIEF COMPLAINT:  Difficulty speaking and right-sided weakness  HISTORY OF PRESENT ILLNESS:  Mallory Rodriguez  is a 81 y.o. female with a known history of hypertension, hyperlipidemia came into the ED with a chief complaint of difficulty speaking and weakness on her right side.According to the patient at 10:30 AM she developed acute difficulty speaking with the family describes as very slurred and garbled speech. She is also having trouble moving her right lower extremity. EMS came out to the patient, but at that time the symptoms had resolved and she refused transport. She shortly after that began having trouble speaking once again so she came to the emergency department for evaluation. Denies any history of stroke. CT head did not reveal any acute infarcts  PAST MEDICAL HISTORY:   Past Medical History  Diagnosis Date  . MVP (mitral valve prolapse)     and tricuspid leak  . AP (angina pectoris)   . Hiatal hernia   . Hypogammaglobulinemia     borderline  . Pelvic kidney     lower left side  . Sciatica   . Claustrophobia   . Diverticulosis of colon   . HLD (hyperlipidemia)   . Pernicious anemia   . HTN (hypertension)   . OA (osteoarthritis)     PAST SURGICAL HISTOIRY:   Past Surgical History  Procedure Laterality Date  . Appendectomy    . Tonsillectomy    . Cholecystectomy    . Right ovary and tube corpus    . Dilation and curettage of uterus    . Breast lumpectomy      Right (fatty tumor)  . Inguinal hernia repair      Left side  . Excision vestibular glands    . Basal cell carcinoma removal      Back  . Intraocular lens insertion      Bilateral  . Film removed left eye    . Total abdominal hysterectomy    .  Knee arthroscopy w/ partial medial meniscectomy  08/2009    right    SOCIAL HISTORY:   History  Substance Use Topics  . Smoking status: Never Smoker   . Smokeless tobacco: Never Used  . Alcohol Use: Yes     Comment: Regular wine a few nights a week    FAMILY HISTORY:   Family History  Problem Relation Age of Onset  . Heart attack Father 35    CABG  . Dementia Mother   . Cancer Maternal Aunt     Breast-multiple aunts and Gm  . Cancer Maternal Uncle     Bladder and lung  . Diabetes Paternal Grandmother   . Hypertension Father   . Hypertension Brother     DRUG ALLERGIES:   Allergies  Allergen Reactions  . Bee Venom Anaphylaxis  . Contrast Media [Iodinated Diagnostic Agents] Hives  . Lipitor [Atorvastatin] Swelling  . Ace Inhibitors Swelling  . Hydrocodone Other (See Comments)    Reaction:  Unknown   . Macrodantin Other (See Comments)    Reaction:  Chest pain   . Neosporin [Neomycin-Bacitracin Zn-Polymyx] Other (See Comments)    Reaction:  Burning   . Sulfa Antibiotics Hives  . Epinephrine Palpitations  . Prednisone Rash and Other (See Comments)  Reaction:  Dizziness and headache     REVIEW OF SYSTEMS:  CONSTITUTIONAL: No fever, fatigue or weakness.  EYES: No blurred or double vision.  EARS, NOSE, AND THROAT: No tinnitus or ear pain.  RESPIRATORY: No cough, shortness of breath, wheezing or hemoptysis.  CARDIOVASCULAR: No chest pain, orthopnea, edema.  GASTROINTESTINAL: No nausea, vomiting, diarrhea or abdominal pain.  GENITOURINARY: No dysuria, hematuria.  ENDOCRINE: No polyuria, nocturia,  HEMATOLOGY: No anemia, easy bruising or bleeding SKIN: No rash or lesion. MUSCULOSKELETAL: No joint pain or arthritis.   NEUROLOGIC: No tingling, numbness, weakness. Reporting intermittent episodes of dysarthria PSYCHIATRY: No anxiety or depression.   MEDICATIONS AT HOME:   Prior to Admission medications   Medication Sig Start Date End Date Taking? Authorizing  Provider  CALCIUM-VITAMIN D-VITAMIN K PO Take 1 each by mouth daily.   Yes Historical Provider, MD  colestipol (COLESTID) 1 G tablet Take 2 tablets (2 g total) by mouth daily. 11/30/14  Yes Venia Carbon, MD  cyanocobalamin (,VITAMIN B-12,) 1000 MCG/ML injection Inject 1,000 mcg into the muscle every 3 (three) months.   Yes Historical Provider, MD  diphenhydrAMINE (BENADRYL) 12.5 MG/5ML liquid Take 37.5 mg by mouth 4 (four) times daily as needed for allergies.    Yes Historical Provider, MD  EPINEPHrine (EPIPEN JR 2-PAK IJ) Inject 1 Dose as directed once as needed (for severe allergic reaction).   Yes Historical Provider, MD  hydrochlorothiazide (HYDRODIURIL) 25 MG tablet Take 25 mg by mouth daily.   Yes Historical Provider, MD  ibuprofen (ADVIL,MOTRIN) 200 MG tablet Take 200-400 mg by mouth every 6 (six) hours as needed for headache or mild pain.   Yes Historical Provider, MD  Multiple Vitamins-Minerals (CENTRUM SILVER ULTRA WOMENS PO) Take 1 tablet by mouth daily.   Yes Historical Provider, MD  nitroGLYCERIN (NITROLINGUAL) 0.4 MG/SPRAY spray Place 1 spray under the tongue every 5 (five) minutes as needed for chest pain.   Yes Historical Provider, MD  Omega-3 Fatty Acids (FISH OIL) 1200 MG CAPS Take 1 capsule by mouth daily.   Yes Historical Provider, MD  Polyethyl Glycol-Propyl Glycol (SYSTANE OP) Apply 1 drop to eye as needed (for dry eyes).   Yes Historical Provider, MD  Probiotic Product (ALIGN) 4 MG CAPS Take 1 capsule by mouth daily.   Yes Historical Provider, MD      VITAL SIGNS:  Blood pressure 164/69, pulse 67, temperature 98.4 F (36.9 C), temperature source Oral, resp. rate 18, height 5' (1.524 m), weight 73.483 kg (162 lb), SpO2 99 %.  PHYSICAL EXAMINATION:  GENERAL:  81 y.o.-year-old patient lying in the bed with no acute distress.  EYES: Pupils equal, round, reactive to light and accommodation. No scleral icterus. Extraocular muscles intact.  HEENT: Head atraumatic,  normocephalic. Oropharynx and nasopharynx clear.  NECK:  Supple, no jugular venous distention. No thyroid enlargement, no tenderness.  LUNGS: Normal breath sounds bilaterally, no wheezing, rales,rhonchi or crepitation. No use of accessory muscles of respiration.  CARDIOVASCULAR: S1, S2 normal. No murmurs, rubs, or gallops.  ABDOMEN: Soft, nontender, nondistended. Bowel sounds present. No organomegaly or mass.  EXTREMITIES: No pedal edema, cyanosis, or clubbing.  NEUROLOGIC: Cranial nerves II through XII are intact. Muscle strength 5/5 in all extremities. Sensation intact. Gait not checked. No pronator drift. No cerebellar signs PSYCHIATRIC: The patient is alert and oriented x 3.  SKIN: No obvious rash, lesion, or ulcer.   LABORATORY PANEL:   CBC  Recent Labs Lab 08/01/15 1210  WBC 5.9  HGB 13.4  HCT 41.0  PLT 212   ------------------------------------------------------------------------------------------------------------------  Chemistries   Recent Labs Lab 08/01/15 1210  NA 137  K 3.2*  CL 101  CO2 25  GLUCOSE 137*  BUN 23*  CREATININE 1.08*  CALCIUM 9.2  AST 34  ALT 24  ALKPHOS 51  BILITOT 0.6   ------------------------------------------------------------------------------------------------------------------  Cardiac Enzymes  Recent Labs Lab 08/01/15 1330  TROPONINI <0.03   ------------------------------------------------------------------------------------------------------------------  RADIOLOGY:  Ct Head Wo Contrast  08/01/2015   CLINICAL DATA:  Slurred speech and confusion  EXAM: CT HEAD WITHOUT CONTRAST  TECHNIQUE: Contiguous axial images were obtained from the base of the skull through the vertex without intravenous contrast.  COMPARISON:  09/06/2007  FINDINGS: Skull and Sinuses:Negative for fracture or destructive process. The mastoids, middle ears, and imaged paranasal sinuses are clear.  Orbits: Bilateral cataract resection; no acute finding.  Brain:  No evidence of acute infarction, hemorrhage, hydrocephalus, or mass lesion/mass effect.  Stable coarse dystrophic calcifications in the left caudate and anterior putamen, with focal surrounding low-density. This likely reflects calcification in a remote lateral lenticulostriate infarct.  Generalized cerebral volume loss which is mildly progressed from 2008. Mild small vessel ischemic change around the lateral ventricles.  These results were called by telephone at the time of interpretation on 08/01/2015 at 12:01 pm to Dr. Loura Pardon , who verbally acknowledged these results.  IMPRESSION: 1. No acute finding. 2. Stable atrophy and calcification of left basal ganglia, likely from remote infarct.   Electronically Signed   By: Monte Fantasia M.D.   On: 08/01/2015 12:16   US Carotid Bilateral  08/01/2015   CLINICAL DATA:  81 year old female with a history of TIA.  Cardiovascular risk factors include hypertension, prior TIA/stroke, hyperlipidemia.  EXAM: BILATERAL CAROTID DUPLEX ULTRASOUND  TECHNIQUE: Pearline Cables scale imaging, color Doppler and duplex ultrasound were performed of bilateral carotid and vertebral arteries in the neck.  COMPARISON:  No prior duplex  FINDINGS: Criteria: Quantification of carotid stenosis is based on velocity parameters that correlate the residual internal carotid diameter with NASCET-based stenosis levels, using the diameter of the distal internal carotid lumen as the denominator for stenosis measurement.  The following velocity measurements were obtained:  RIGHT  ICA:  Systolic 78 cm/sec, Diastolic 19 cm/sec  CCA:  81 cm/sec  SYSTOLIC ICA/CCA RATIO:  1.0  ECA:  106 cm/sec  LEFT  ICA:  Systolic 67 cm/sec, Diastolic 17 cm/sec  CCA:  86 cm/sec  SYSTOLIC ICA/CCA RATIO:  0.8  ECA:  96 cm/sec  Right Brachial SBP: Not acquired  Left Brachial SBP: Not acquired  RIGHT CAROTID ARTERY: No significant calcific changes of the common carotid artery. Intermediate waveform maintained. Heterogeneous and  partially calcified plaque at the right carotid bifurcation. Calcifications on the anterior wall contribute to lumen shadowing. Greatest velocity measured distal to the shadowing.  Low resistance waveform of the ICA.  No significant tortuosity.  RIGHT VERTEBRAL ARTERY: Antegrade flow with low resistance waveform.  LEFT CAROTID ARTERY: No significant calcific changes of the common carotid artery. Intermediate waveform maintained. Heterogeneous plaque at the left carotid bifurcation without significant calcifications. No significant tortuosity. Low resistance waveform of the left ICA.  LEFT VERTEBRAL ARTERY:  Antegrade flow with low resistance waveform.  IMPRESSION: Right:  Color duplex indicates minimal heterogeneous and calcified plaque, with no hemodynamically significant stenosis by duplex criteria in the extracranial cerebrovascular circulation. Note that the flow velocities of the right ICA were obtained from an area distal to the maximum narrowing due to the presence of  anterior wall plaque with shadowing and may be underestimating the percentage of ICA stenosis. If a more specific determination of carotid stenosis is warranted, consider formal cerebral angiography, or as a second best test, CTA.  Left:  Color duplex indicates minimal heterogeneous plaque, with no hemodynamically significant stenosis by duplex criteria in the extracranial cerebrovascular circulation.  Signed,  Dulcy Fanny. Earleen Newport, DO  [facility]  [facility]   Electronically Signed   By: Corrie Mckusick D.O.   On: 08/01/2015 16:55    EKG:   Orders placed or performed during the hospital encounter of 08/01/15  . ED EKG  . ED EKG    IMPRESSION AND PLAN:   Arneta Moeckel  is a 81 y.o. female with a known history of hypertension, hyperlipidemia came into the ED with a chief complaint of difficulty speaking and weakness on her right side.According to the patient at 10:30 AM she developed acute  difficulty speaking with the family describes as very slurred and garbled speech. She is also having trouble moving her right lower extremity. EMS came out to the patient, but at that time the symptoms had resolved and she refused transport. She shortly after that began having trouble speaking once again so she came to the emergency department for evaluation. Denies any history of stroke  1. TIA with intermittent episodes of dysarthria and right-sided weakness  Stroke workup with carotid Dopplers, 2-D echocardiogram and MRI of the brain Lipid panel in a.m. Neuro checks every 4 hours Provide aspirin and continue her home medication Fish oil as patient is allergic to statin Currently patient is asymptomatic PT evaluation as needed  2. History of hypertension Continue her home medication hydrochlorothiazide  3. Hypokalemia Replace potassium  4. Hyperlipidemia Check fasting lipid panel and continue omega-3 fatty acids  All the records are reviewed and case discussed with ED provider. Management plans discussed with the patient, family and they are in agreement.  CODE STATUS: Full code, husband is the healthcare power of attorney  TOTAL TIME TAKING CARE OF THIS PATIENT: Reviewing medical records, history and physical, admission orders and coronation of care-45 minutes.    Nicholes Mango M.D on 08/01/2015 at 9:56 PM  Between 7am to 6pm - Pager - [phone]  After 6pm go to [email or web address] - password [email or web address] [facility]  Office  [phone]  CC: Primary care physician; [name], MD

## 2015-08-01 NOTE — ED Provider Notes (Signed)
[facility] Emergency Department Provider Note  Time seen: 1:15 PM  I have reviewed the triage vital signs and the nursing notes.   HISTORY  Chief Complaint Code Stroke    HPI Mallory Rodriguez is a 81 y.o. female with a past medical history of angina, hyperlipidemia, hypertension, arthritis who presents the emergency department with difficulty speaking and weakness of her right side. According to the patient at 10:30 AM she developed acute difficulty speaking with the family describes as very slurred and garbled speech. She is also having trouble moving her right lower extremity. EMS came out to the patient, but at that time the symptoms had resolved and she refused transport. She shortly after that began having trouble speaking once again so she came to the emergency department for evaluation. No history of stroke. Denies any difficulty speaking currently, denies any weakness currently. Denies any headache now or at any time today. Denies any chest pain or trouble breathing. Family describes her symptoms as significant, but they've now resolved.     Past Medical History  Diagnosis Date  . MVP (mitral valve prolapse)     and tricuspid leak  . AP (angina pectoris)   . Hiatal hernia   . Hypogammaglobulinemia     borderline  . Pelvic kidney     lower left side  . Sciatica   . Claustrophobia   . Diverticulosis of colon   . HLD (hyperlipidemia)   . Pernicious anemia   . HTN (hypertension)   . OA (osteoarthritis)     Patient Active Problem List   Diagnosis Date Noted  . Mitral valve disorder 02/11/2015  . Local reaction to pneumococcal vaccine 12/10/2014  . Tremor 11/30/2014  . Nail, ingrown 12/18/2013  . Allergic rhinitis due to pollen 05/18/2013  . Routine general medical examination at a health care facility 11/18/2012  . Actinic keratosis 05/14/2012  . Osteoarthritis, multiple sites 11/07/2010  . DIARRHEA, CHRONIC 11/07/2010  . Hyperlipemia  11/02/2010  . PERNICIOUS ANEMIA 11/02/2010  . DIVERTICULOSIS, COLON 11/02/2010  . GENERALIZED ANXIETY DISORDER 02/09/2010  . HYPERTENSION, BENIGN 02/09/2010  . MITRAL VALVE PROLAPSE 02/09/2010    Past Surgical History  Procedure Laterality Date  . Appendectomy    . Tonsillectomy    . Cholecystectomy    . Right ovary and tube corpus    . Dilation and curettage of uterus    . Breast lumpectomy      Right (fatty tumor)  . Inguinal hernia repair      Left side  . Excision vestibular glands    . Basal cell carcinoma removal      Back  . Intraocular lens insertion      Bilateral  . Film removed left eye    . Total abdominal hysterectomy    . Knee arthroscopy w/ partial medial meniscectomy  08/2009    right    Current Outpatient Rx  Name  Route  Sig  Dispense  Refill  . Calcium-Vitamin D-Vitamin K (VIACTIV) [id number] MG-UNT-MCG CHEW   Oral   Chew 1 tablet by mouth daily.           . colestipol (COLESTID) 1 G tablet   Oral   Take 2 tablets (2 g total) by mouth daily.   60 tablet   11   . Cranberry 200 MG CAPS   Oral   Take 1 capsule by mouth daily.         . Cyanocobalamin (VITAMIN B-12 IJ)   Injection  Inject as directed every 5 (five) weeks.         Marland Kitchen EPIPEN JR 2-PAK 0.15 MG/0.3ML injection      INJECT 0.3ML'S IM AS NEEDED FOR ANAPHYLAXIS   2 each   0   . fish oil-omega-3 fatty acids 1000 MG capsule   Oral   Take 1 g by mouth daily.           . hydrochlorothiazide (HYDRODIURIL) 25 MG tablet      TAKE ONE TABLET BY MOUTH DAILY.   30 tablet   9   . Multiple Vitamins-Minerals (CENTRUM SILVER PO)   Oral   Take 1 tablet by mouth daily.         . nitroGLYCERIN (NITROLINGUAL) 0.4 MG/SPRAY spray      PLACE ONE SPRAY UNDER THE TONGUE EVERY FIVE MINUTES AS NEEDED FOR CHEST PAIN   5 g   0   . Probiotic Product (PROBIOTIC DAILY) CAPS   Oral   Take by mouth daily.           Allergies Bee venom; Lipitor; Ace inhibitors; Contrast media;  Epinephrine; Hydrocodone; Macrodantin; Prednisone; and Sulfonamide derivatives  Family History  Problem Relation Age of Onset  . Heart attack Father 35    CABG  . Dementia Mother   . Cancer Maternal Aunt     Breast-multiple aunts and Gm  . Cancer Maternal Uncle     Bladder and lung  . Diabetes Paternal Grandmother   . Hypertension Father   . Hypertension Brother     Social History History  Substance Use Topics  . Smoking status: Never Smoker   . Smokeless tobacco: Never Used  . Alcohol Use: Yes     Comment: Regular wine a few nights a week    Review of Systems Constitutional: Negative for fever Cardiovascular: Negative for chest pain. Respiratory: Negative for shortness of breath. Gastrointestinal: Negative for abdominal pain Neurological: Negative for headaches, or numbness. Positive trouble talking now resolved.Positive trouble moving her right lower extremity, now resolved.   10-point ROS otherwise negative.  ____________________________________________   PHYSICAL EXAM:  VITAL SIGNS: ED Triage Vitals  Enc Vitals Group     BP 08/01/15 1137 163/108 mmHg     Pulse Rate 08/01/15 1137 106     Resp 08/01/15 1137 19     Temp 08/01/15 1137 98 F (36.7 C)     Temp Source 08/01/15 1137 Oral     SpO2 08/01/15 1137 98 %     Weight 08/01/15 1137 162 lb (73.483 kg)     Height 08/01/15 1137 5' (1.524 m)     Head Cir --      Peak Flow --      Pain Score 08/01/15 1137 0     Pain Loc --      Pain Edu? --      Excl. in [place]? --     Constitutional: Alert and oriented. Well appearing and in no distress. Eyes: Normal exam, 2 mm PERRL, EOMI  ENT   Head: Normocephalic and atraumatic. Cardiovascular: Normal rate, regular rhythm. No murmur Respiratory: Normal respiratory effort without tachypnea nor retractions. Breath sounds are clear and equal bilaterally. No wheezes/rales/rhonchi. Gastrointestinal: Soft and nontender. No distention.  Musculoskeletal: Nontender with  normal range of motion in all extremities. No lower extremity tenderness or edema. Neurologic:  Normal speech and language. No gross focal neurologic deficits are appreciated. Speech is normal.No pronator drift. No lower extremity drift. Normal finger to nose testing. Cranial  nerves intact.  Skin:  Skin is warm, dry and intact.  Psychiatric: Mood and affect are normal. Speech and behavior are normal.  ____________________________________________    EKG  EKG reviewed and interpreted by myself shows sinus tachycardia 107 bpm, narrow QRS, left axis deviation, normal intervals, nonspecific but no concerning ST changes.  ____________________________________________    RADIOLOGY  CT negative for acute findings. Stable calcifications present.  ____________________________________________   INITIAL IMPRESSION / ASSESSMENT AND PLAN / ED COURSE  Pertinent labs & imaging results that were available during my care of the patient were reviewed by me and considered in my medical decision making (see chart for details).  No acute findings on CT. Labs have resulted in largely normal results. Her symptoms seem very consistent with transient ischemic attack. No symptoms currently, NIH stroke scale of 0 currently we will admit the patient to the hospital for her likely mini stroke.  ____________________________________________   FINAL CLINICAL IMPRESSION(S) / ED DIAGNOSES  Transient ischemic attack   Harvest Dark, MD 08/01/15 1320

## 2015-08-02 DIAGNOSIS — G459 Transient cerebral ischemic attack, unspecified: Secondary | ICD-10-CM | POA: Diagnosis not present

## 2015-08-02 DIAGNOSIS — E876 Hypokalemia: Secondary | ICD-10-CM | POA: Diagnosis not present

## 2015-08-02 DIAGNOSIS — E785 Hyperlipidemia, unspecified: Secondary | ICD-10-CM | POA: Diagnosis not present

## 2015-08-02 DIAGNOSIS — I10 Essential (primary) hypertension: Secondary | ICD-10-CM | POA: Diagnosis not present

## 2015-08-02 DIAGNOSIS — R4781 Slurred speech: Secondary | ICD-10-CM | POA: Diagnosis not present

## 2015-08-02 LAB — LIPID PANEL
Cholesterol: 255 mg/dL — ABNORMAL HIGH (ref 0–200)
HDL: 96 mg/dL (ref >40)
LDL Cholesterol: 130 mg/dL — ABNORMAL HIGH (ref 0–99)
Total CHOL/HDL Ratio: 2.7 RATIO
Triglycerides: 143 mg/dL (ref <150)
VLDL: 29 mg/dL (ref 0–40)

## 2015-08-02 LAB — CBC
HEMATOCRIT: 39.8 % (ref 35.0–47.0)
Hemoglobin: 13.1 g/dL (ref 12.0–16.0)
MCH: 31.1 pg (ref 26.0–34.0)
MCHC: 32.9 g/dL (ref 32.0–36.0)
MCV: 94.5 fL (ref 80.0–100.0)
Platelets: 190 10*3/uL (ref 150–440)
RBC: 4.21 MIL/uL (ref 3.80–5.20)
RDW: 13.3 % (ref 11.5–14.5)
WBC: 4.7 10*3/uL (ref 3.6–11.0)

## 2015-08-02 LAB — COMPREHENSIVE METABOLIC PANEL
ALT: 20 U/L (ref 14–54)
AST: 26 U/L (ref 15–41)
Albumin: 4 g/dL (ref 3.5–5.0)
Alkaline Phosphatase: 45 U/L (ref 38–126)
Anion gap: 9 (ref 5–15)
BUN: 23 mg/dL — AB (ref 6–20)
CHLORIDE: 104 mmol/L (ref 101–111)
CO2: 26 mmol/L (ref 22–32)
Calcium: 9.2 mg/dL (ref 8.9–10.3)
Creatinine, Ser: 0.9 mg/dL (ref 0.44–1.00)
GFR calc Af Amer: >60 mL/min (ref >60)
GFR, EST NON AFRICAN AMERICAN: 59 mL/min — AB (ref >60)
Glucose, Bld: 105 mg/dL — ABNORMAL HIGH (ref 65–99)
Potassium: 3.7 mmol/L (ref 3.5–5.1)
Sodium: 139 mmol/L (ref 135–145)
TOTAL PROTEIN: 7.1 g/dL (ref 6.5–8.1)
Total Bilirubin: 0.6 mg/dL (ref 0.3–1.2)

## 2015-08-02 MED ORDER — ASPIRIN 81 MG PO TABS: 81.0000 mg | ORAL_TABLET | Freq: Every day | ORAL | Status: DC

## 2015-08-02 MED ORDER — ASPIRIN 325 MG PO TBEC: 325.0000 mg | DELAYED_RELEASE_TABLET | Freq: Every day | ORAL | Status: DC

## 2015-08-02 NOTE — Progress Notes (Signed)
Discharged information given & explained to patient w/ teach back method. Stroke education booklet reviewed and made sure pt took home.

## 2015-08-02 NOTE — Plan of Care (Signed)
Problem: Discharge/Transitional Outcomes Goal: Other Discharge Outcomes/Goals Outcome: Progressing Plan of care progress to goal: Patient with neuro checks now every 4 hours, patient without weakness, numbness, drift, slurred speech, patient denies discomfort or change from her usual base line. Patient with telemetry intact, NSR with pvc, v/s stable.  Patient is had echocardiogram, and is having MRI of brain today on Aug 2nd 2016.  Patient is alert and oriented, steady on feet.  Labs monitored.

## 2015-08-02 NOTE — Progress Notes (Signed)
Dr. Vianne Bulls notified ECHO read, impression: no source of TIA or CVA noted. EF 50-55%. MD states she will placed d/c orders.

## 2015-08-02 NOTE — Care Management (Signed)
Admitted to [facility] with the diagnosis of TIA. Lives with husband, Nadara Mustard. Sees Dr. Silvio Pate, last seen in December. Skilled Nursing facilities in [place] following surgeries. No home health in the past. Takes care of all activities of daily living herself, still drives.  Possible discharge later today. Husband will drive. Shelbie Ammons RN MSN Care Management [phone]

## 2015-08-02 NOTE — Care Management Important Message (Signed)
Important Message  Patient Details  Name: Akosua Constantine MRN: [id number] Date of Birth: [birth year]   Medicare Important Message Given:    Medicare observation letter signed and issued. Copy placed on chart.   Shelbie Ammons 08/02/2015, 8:54 AM

## 2015-08-02 NOTE — Progress Notes (Signed)
Pt states she is unable to tolerate MRI even w/ medications to help stay calm & relaxed. MRI called RN to verify pt refusing & stated they would call MD to notify.

## 2015-08-02 NOTE — Progress Notes (Signed)
Still awaiting d/c. Dr. Vianne Bulls paged again. Pt very upset stating she does not want to be charged for another day in the hospital since the MD stated she could be d/c once Echo read and OK'd. Verbal order for discharge today.

## 2015-08-03 ENCOUNTER — Telehealth: Payer: Self-pay | Admitting: *Deleted

## 2015-08-03 NOTE — Telephone Encounter (Signed)
Transitional care call attempted.  Left message for patient to return call. 

## 2015-08-04 NOTE — Telephone Encounter (Signed)
Transition Care Management Follow-up Telephone Call   Date discharged?  08/02/15   How have you been since you were released from the hospital? Doing increasingly better aside from some forgetfulness   Do you understand why you were in the hospital? yes   Do you understand the discharge instructions? yes   Where were you discharged to? Home   Items Reviewed:  Medications reviewed: yes  Allergies reviewed: yes  Dietary changes reviewed: yes, low cholesterol  Referrals reviewed: n/a   Functional Questionnaire:   Activities of Daily Living (ADLs):   She states they are independent in the following: ambulation, bathing and hygiene, feeding, continence, grooming, toileting and dressing States they require assistance with the following: None   Any transportation issues/concerns?: no   Any patient concerns? yes, continued forgetfulness   Confirmed importance and date/time of follow-up visits scheduled yes, 08/08/15 @ 1445  Provider Appointment booked with [name], MD  Confirmed with patient if condition begins to worsen call PCP or go to the ER.  Patient was given the office number and encouraged to call back with question or concerns.  : yes

## 2015-08-04 NOTE — Discharge Summary (Signed)
Mallory Rodriguez, is a 81 y.o. female  DOB May 12, 1933  MRN [id number].  Admission date:  08/01/2015  Admitting Physician  Nicholes Mango, MD  Discharge Date:  8/2/016   Primary MD  [name], MD  Recommendations for primary care physician for things to follow:  Follow up with her PMD in one week   Admission Diagnosis  Transient cerebral ischemia, unspecified transient cerebral ischemia type [G45.9]   Discharge Diagnosis  Transient cerebral ischemia, unspecified transient cerebral ischemia type [G45.9]    Active Problems:   TIA (transient ischemic attack)      Past Medical History  Diagnosis Date  . MVP (mitral valve prolapse)     and tricuspid leak  . AP (angina pectoris)   . Hiatal hernia   . Hypogammaglobulinemia     borderline  . Pelvic kidney     lower left side  . Sciatica   . Claustrophobia   . Diverticulosis of colon   . HLD (hyperlipidemia)   . Pernicious anemia   . HTN (hypertension)   . OA (osteoarthritis)     Past Surgical History  Procedure Laterality Date  . Appendectomy    . Tonsillectomy    . Cholecystectomy    . Right ovary and tube corpus    . Dilation and curettage of uterus    . Breast lumpectomy      Right (fatty tumor)  . Inguinal hernia repair      Left side  . Excision vestibular glands    . Basal cell carcinoma removal      Back  . Intraocular lens insertion      Bilateral  . Film removed left eye    . Total abdominal hysterectomy    . Knee arthroscopy w/ partial medial meniscectomy  08/2009    right       History of present illness and  Hospital Course:     Kindly see H&P for history of present illness and admission details, please review complete Labs, Consult reports and Test reports for all details in brief  HPI  from the history and physical done on the day of  admission 81 yr old female with htn,HLP,admitted for TIA symptoms.noted to have sudden difficulty speaking with slurred speech,no other neurological symptoms.admitted to OBS status for TIA eval.   Hospital Course  1.Transient speech difficulty;resolved by the time she came  To ER.CT head did not show new stroke,pt could not go for MRI brain as she is very claustrophobic,and she said she cant have MRI with ativan or xanax.she said she cant have OPen MRI also.SO I spoke with DR.Zelikman (neuro) on call,he suggested that pt can be discharged home with ASA.no further need to keep monitroing if she cant have MRI>her CArotid sono showed plaque bilaterally but no hemodynamic significant stenosis.Echo showed EF more than 55%.pt said that she is  Intolerant to all statins,due to muscle pain and refuse to  taoke any of them,and she was taking only fish oil supplements.LDL is 130.this issue should be addressed by her primary physicican,   Discharge Condition:stable   Follow UP  Follow-up Information    Follow up with [name], MD On 08/08/2015.   Specialties:  Internal Medicine, Pediatrics   Why:  Appointment scheduled @ 2:45 pm.   Contact information:   [address] [place] [id number] [phone]         Discharge Instructions  and  Discharge Medications        Medication  List    TAKE these medications        ALIGN 4 MG Caps  Take 1 capsule by mouth daily.     aspirin 325 MG EC tablet  Take 1 tablet (325 mg total) by mouth daily.     CALCIUM-VITAMIN D-VITAMIN K PO  Take 1 each by mouth daily.     CENTRUM SILVER ULTRA WOMENS PO  Take 1 tablet by mouth daily.     colestipol 1 G tablet  Commonly known as:  COLESTID  Take 2 tablets (2 g total) by mouth daily.     cyanocobalamin 1000 MCG/ML injection  Commonly known as:  (VITAMIN B-12)  Inject 1,000 mcg into the muscle every 3 (three) months.     diphenhydrAMINE 12.5 MG/5ML liquid  Commonly known as:   BENADRYL  Take 37.5 mg by mouth 4 (four) times daily as needed for allergies.     EPIPEN JR 2-PAK IJ  Inject 1 Dose as directed once as needed (for severe allergic reaction).     Fish Oil 1200 MG Caps  Take 1 capsule by mouth daily.     hydrochlorothiazide 25 MG tablet  Commonly known as:  HYDRODIURIL  Take 25 mg by mouth daily.     ibuprofen 200 MG tablet  Commonly known as:  ADVIL,MOTRIN  Take 200-400 mg by mouth every 6 (six) hours as needed for headache or mild pain.     nitroGLYCERIN 0.4 MG/SPRAY spray  Commonly known as:  NITROLINGUAL  Place 1 spray under the tongue every 5 (five) minutes as needed for chest pain.     SYSTANE OP  Apply 1 drop to eye as needed (for dry eyes).          Diet and Activity recommendation: See Discharge Instructions above   Consults obtained - none   Major procedures and Radiology Reports - PLEASE review detailed and final reports for all details, in brief -     Ct Head Wo Contrast  08/01/2015   CLINICAL DATA:  Slurred speech and confusion  EXAM: CT HEAD WITHOUT CONTRAST  TECHNIQUE: Contiguous axial images were obtained from the base of the skull through the vertex without intravenous contrast.  COMPARISON:  09/06/2007  FINDINGS: Skull and Sinuses:Negative for fracture or destructive process. The mastoids, middle ears, and imaged paranasal sinuses are clear.  Orbits: Bilateral cataract resection; no acute finding.  Brain: No evidence of acute infarction, hemorrhage, hydrocephalus, or mass lesion/mass effect.  Stable coarse dystrophic calcifications in the left caudate and anterior putamen, with focal surrounding low-density. This likely reflects calcification in a remote lateral lenticulostriate infarct.  Generalized cerebral volume loss which is mildly progressed from 2008. Mild small vessel ischemic change around the lateral ventricles.  These results were called by telephone at the time of interpretation on 08/01/2015 at 12:01 pm to Dr. Loura Pardon , who verbally acknowledged these results.  IMPRESSION: 1. No acute finding. 2. Stable atrophy and calcification of left basal ganglia, likely from remote infarct.   Electronically Signed   By: Monte Fantasia M.D.   On: 08/01/2015 12:16   US Carotid Bilateral  08/01/2015   CLINICAL DATA:  80 year old female with a history of TIA.  Cardiovascular risk factors include hypertension, prior TIA/stroke, hyperlipidemia.  EXAM: BILATERAL CAROTID DUPLEX ULTRASOUND  TECHNIQUE: Pearline Cables scale imaging, color Doppler and duplex ultrasound were performed of bilateral carotid and vertebral arteries in the neck.  COMPARISON:  No prior duplex  FINDINGS: Criteria: Quantification of carotid stenosis is  based on velocity parameters that correlate the residual internal carotid diameter with NASCET-based stenosis levels, using the diameter of the distal internal carotid lumen as the denominator for stenosis measurement.  The following velocity measurements were obtained:  RIGHT  ICA:  Systolic 78 cm/sec, Diastolic 19 cm/sec  CCA:  81 cm/sec  SYSTOLIC ICA/CCA RATIO:  1.0  ECA:  106 cm/sec  LEFT  ICA:  Systolic 67 cm/sec, Diastolic 17 cm/sec  CCA:  86 cm/sec  SYSTOLIC ICA/CCA RATIO:  0.8  ECA:  96 cm/sec  Right Brachial SBP: Not acquired  Left Brachial SBP: Not acquired  RIGHT CAROTID ARTERY: No significant calcific changes of the common carotid artery. Intermediate waveform maintained. Heterogeneous and partially calcified plaque at the right carotid bifurcation. Calcifications on the anterior wall contribute to lumen shadowing. Greatest velocity measured distal to the shadowing.  Low resistance waveform of the ICA.  No significant tortuosity.  RIGHT VERTEBRAL ARTERY: Antegrade flow with low resistance waveform.  LEFT CAROTID ARTERY: No significant calcific changes of the common carotid artery. Intermediate waveform maintained. Heterogeneous plaque at the left carotid bifurcation without significant calcifications. No significant  tortuosity. Low resistance waveform of the left ICA.  LEFT VERTEBRAL ARTERY:  Antegrade flow with low resistance waveform.  IMPRESSION: Right:  Color duplex indicates minimal heterogeneous and calcified plaque, with no hemodynamically significant stenosis by duplex criteria in the extracranial cerebrovascular circulation. Note that the flow velocities of the right ICA were obtained from an area distal to the maximum narrowing due to the presence of anterior wall plaque with shadowing and may be underestimating the percentage of ICA stenosis. If a more specific determination of carotid stenosis is warranted, consider formal cerebral angiography, or as a second best test, CTA.  Left:  Color duplex indicates minimal heterogeneous plaque, with no hemodynamically significant stenosis by duplex criteria in the extracranial cerebrovascular circulation.  Signed,  Dulcy Fanny. Earleen Newport, DO  [facility]  [facility]   Electronically Signed   By: Corrie Mckusick D.O.   On: 08/01/2015 16:55    Micro Results    No results found for this or any previous visit (from the past 240 hour(s)).     Today   Subjective:   Sherrie Dicola today has no  Slurred speech,no weakness,wanted to go home.  Objective:   Blood pressure 146/52, pulse 70, temperature 98 F (36.7 C), temperature source Oral, resp. rate 18, height 5' (1.524 m), weight 73.483 kg (162 lb), SpO2 100 %.  No intake or output data in the 24 hours ending 08/04/15 0752  Exam Awake Alert, Oriented x 3, No new F.N deficits, Normal affect .AT,PERRAL Supple Neck,No JVD, No cervical lymphadenopathy appriciated.  Symmetrical Chest wall movement, Good air movement bilaterally, CTAB RRR,No Gallops,Rubs or new Murmurs, No Parasternal Heave +ve B.Sounds, Abd Soft, Non tender, No organomegaly appriciated, No rebound -guarding or rigidity. No Cyanosis, Clubbing or edema, No new Rash or bruise  Data Review   CBC w  Diff:  Lab Results  Component Value Date   WBC 4.7 08/02/2015   WBC 7.7 12/25/2014   HGB 13.1 08/02/2015   HGB 14.1 12/25/2014   HCT 39.8 08/02/2015   HCT 42.2 12/25/2014   PLT 190 08/02/2015   PLT 204 12/25/2014   LYMPHOPCT 19 08/01/2015   LYMPHOPCT 6.5 12/25/2014   MONOPCT 12 08/01/2015   MONOPCT 10.5 12/25/2014   EOSPCT 2 08/01/2015   EOSPCT 0.5 12/25/2014   BASOPCT 1 08/01/2015   BASOPCT 0.4 12/25/2014  CMP:  Lab Results  Component Value Date   NA 139 08/02/2015   NA 139 12/25/2014   K 3.7 08/02/2015   K 2.8* 12/25/2014   CL 104 08/02/2015   CL 103 12/25/2014   CO2 26 08/02/2015   CO2 24 12/25/2014   BUN 23* 08/02/2015   BUN 21* 12/25/2014   CREATININE 0.90 08/02/2015   CREATININE 1.05 12/25/2014   PROT 7.1 08/02/2015   PROT 7.9 12/25/2014   ALBUMIN 4.0 08/02/2015   ALBUMIN 4.0 12/25/2014   BILITOT 0.6 08/02/2015   BILITOT 0.4 12/25/2014   ALKPHOS 45 08/02/2015   ALKPHOS 63 12/25/2014   AST 26 08/02/2015   AST 37 12/25/2014   ALT 20 08/02/2015   ALT 31 12/25/2014  .   Total Time in preparing paper work, data evaluation and todays exam - 5 minutes  Caroll Cunnington M.D on 08/02/2015 at 7:52 AM

## 2015-08-08 ENCOUNTER — Encounter: Payer: Self-pay | Admitting: Internal Medicine

## 2015-08-08 ENCOUNTER — Ambulatory Visit (INDEPENDENT_AMBULATORY_CARE_PROVIDER_SITE_OTHER): Payer: Medicare Other | Admitting: Internal Medicine

## 2015-08-08 VITALS — BP 130/60 | HR 56 | Temp 97.6°F | Wt 160.0 lb

## 2015-08-08 DIAGNOSIS — G459 Transient cerebral ischemic attack, unspecified: Secondary | ICD-10-CM | POA: Diagnosis not present

## 2015-08-08 MED ORDER — LOVASTATIN 40 MG PO TABS: 40.0000 mg | ORAL_TABLET | Freq: Every day | ORAL | Status: DC

## 2015-08-08 NOTE — Progress Notes (Signed)
Subjective:    Patient ID: Mallory Rodriguez, female    DOB: 15-Feb-1933, 81 y.o.   MRN: [id number]  HPI Here for hospital follow up  Had apparent TIA but no changes on CT scan Couldn't do MRI due to claustrophobia Echo showed no embolic source and carotids showed only mild blockage Intolerant of statins so just on aspirin  Was getting pictures at [facility] and was calling ex- SIL Noticed expressive aphasia on the phone He called her daughter and she got her and brought her to hospital Still notices some slurring of words but she got back close to normal fairly quickly (at the end of the day when she is tired)  She had another spell since her discharge (on way to [place]) Right arm drooped per daughter and left face dropped some EMS came --but she was better very quickly (and refused reevaluation in hospital)  Current Outpatient Prescriptions on File Prior to Visit  Medication Sig Dispense Refill  . aspirin EC 325 MG EC tablet Take 1 tablet (325 mg total) by mouth daily. 30 tablet 0  . CALCIUM-VITAMIN D-VITAMIN K PO Take 1 each by mouth daily.    . colestipol (COLESTID) 1 G tablet Take 2 tablets (2 g total) by mouth daily. 60 tablet 11  . cyanocobalamin (,VITAMIN B-12,) 1000 MCG/ML injection Inject 1,000 mcg into the muscle every 3 (three) months.    . diphenhydrAMINE (BENADRYL) 12.5 MG/5ML liquid Take 37.5 mg by mouth 4 (four) times daily as needed for allergies.     Marland Kitchen EPINEPHrine (EPIPEN JR 2-PAK IJ) Inject 1 Dose as directed once as needed (for severe allergic reaction).    . hydrochlorothiazide (HYDRODIURIL) 25 MG tablet Take 25 mg by mouth daily.    Marland Kitchen ibuprofen (ADVIL,MOTRIN) 200 MG tablet Take 200-400 mg by mouth every 6 (six) hours as needed for headache or mild pain.    . nitroGLYCERIN (NITROLINGUAL) 0.4 MG/SPRAY spray Place 1 spray under the tongue every 5 (five) minutes as needed for chest pain.    . Omega-3 Fatty Acids (FISH OIL) 1200 MG CAPS Take 1 capsule by mouth daily.    Vladimir Faster Glycol-Propyl Glycol (SYSTANE OP) Apply 1 drop to eye as needed (for dry eyes).    . Probiotic Product (ALIGN) 4 MG CAPS Take 1 capsule by mouth daily.     No current facility-administered medications on file prior to visit.    Allergies  Allergen Reactions  . Bee Venom Anaphylaxis  . Contrast Media [Iodinated Diagnostic Agents] Hives  . Lipitor [Atorvastatin] Swelling  . Ace Inhibitors Swelling  . Hydrocodone Other (See Comments)    Reaction:  Unknown   . Macrodantin Other (See Comments)    Reaction:  Chest pain   . Neosporin [Neomycin-Bacitracin Zn-Polymyx] Other (See Comments)    Reaction:  Burning   . Sulfa Antibiotics Hives  . Epinephrine Palpitations  . Prednisone Rash and Other (See Comments)    Reaction:  Dizziness and headache     Past Medical History  Diagnosis Date  . MVP (mitral valve prolapse)     and tricuspid leak  . AP (angina pectoris)   . Hiatal hernia   . Hypogammaglobulinemia     borderline  . Pelvic kidney     lower left side  . Sciatica   . Claustrophobia   . Diverticulosis of colon   . HLD (hyperlipidemia)   . Pernicious anemia   . HTN (hypertension)   . OA (osteoarthritis)   . TIA (transient  ischemic attack) 8/16    Past Surgical History  Procedure Laterality Date  . Appendectomy    . Tonsillectomy    . Cholecystectomy    . Right ovary and tube corpus    . Dilation and curettage of uterus    . Breast lumpectomy      Right (fatty tumor)  . Inguinal hernia repair      Left side  . Excision vestibular glands    . Basal cell carcinoma removal      Back  . Intraocular lens insertion      Bilateral  . Film removed left eye    . Total abdominal hysterectomy    . Knee arthroscopy w/ partial medial meniscectomy  08/2009    right    Family History  Problem Relation Age of Onset  . Heart attack Father 3    CABG  . Dementia Mother   . Cancer Maternal Aunt     Breast-multiple aunts and Gm  . Cancer Maternal Uncle      Bladder and lung  . Diabetes Paternal Grandmother   . Hypertension Father   . Hypertension Brother     History   Social History  . Marital Status: Married    Spouse Name: N/A  . Number of Children: 2  . Years of Education: N/A   Occupational History  . Retired-bookkeeping/secretarial work     retired  .      Social History Main Topics  . Smoking status: Never Smoker   . Smokeless tobacco: Never Used  . Alcohol Use: Yes     Comment: Regular wine a few nights a week  . Drug Use: No  . Sexual Activity: Not on file   Other Topics Concern  . Not on file   Social History Narrative   Has living will and advance directives.    Requests husband, then daughter to make health care decisions.   Would accept resuscitation but no life prolonging technology if cognitively unaware   Review of Systems She has chronic loose stools---on colestid now Eating better---has lost 5# since hospital stay Sleeps okay    Objective:   Physical Exam  Constitutional: She is oriented to person, place, and time. She appears well-developed and well-nourished. No distress.  Neck: Normal range of motion. Neck supple. No thyromegaly present.  Cardiovascular: Normal rate and normal heart sounds.  Exam reveals no gallop.   No murmur heard. Frequent ectopy but regular   Pulmonary/Chest: Effort normal and breath sounds normal. No respiratory distress. She has no wheezes. She has no rales.  Musculoskeletal: She exhibits no edema or tenderness.  Lymphadenopathy:    She has no cervical adenopathy.  Neurological: She is alert and oriented to person, place, and time. She displays no atrophy and no tremor. No cranial nerve deficit. She exhibits normal muscle tone. She displays a negative Romberg sign. Coordination and gait normal.  Strength normal except mild decrease in right hip strength (ext and flex)--she relates to chronic hip and back problems          Assessment & Plan:

## 2015-08-08 NOTE — Assessment & Plan Note (Signed)
2 separate episodes Has had deficits on both sides---- right hand weakness in the most recent one, and expressive aphasia (in left handed person) Carotids and echo benign BP is okay On aspirin Will try a different statin to see if she can tolerate Will ask Dr Rockey Situ to review to see if loop recorder should be considered

## 2015-08-08 NOTE — Progress Notes (Signed)
Pre visit review using our clinic review tool, if applicable. No additional management support is needed unless otherwise documented below in the visit note. 

## 2015-08-09 ENCOUNTER — Telehealth: Payer: Self-pay

## 2015-08-09 DIAGNOSIS — Z8673 Personal history of transient ischemic attack (TIA), and cerebral infarction without residual deficits: Secondary | ICD-10-CM

## 2015-08-09 DIAGNOSIS — M9901 Segmental and somatic dysfunction of cervical region: Secondary | ICD-10-CM | POA: Diagnosis not present

## 2015-08-09 DIAGNOSIS — R51 Headache: Secondary | ICD-10-CM | POA: Diagnosis not present

## 2015-08-09 DIAGNOSIS — M5134 Other intervertebral disc degeneration, thoracic region: Secondary | ICD-10-CM | POA: Diagnosis not present

## 2015-08-09 DIAGNOSIS — M9902 Segmental and somatic dysfunction of thoracic region: Secondary | ICD-10-CM | POA: Diagnosis not present

## 2015-08-09 DIAGNOSIS — R002 Palpitations: Secondary | ICD-10-CM

## 2015-08-09 NOTE — Telephone Encounter (Signed)
Left message for pt that I have placed order for 30 day monitor and for her to expect a call from [employer] today.  Asked her to call w/ any questions or concerns.

## 2015-08-09 NOTE — Telephone Encounter (Signed)
-----   Message from Minna Merritts, MD sent at 08/09/2015  1:48 PM EDT ----- Regarding: FW: Monitor  Mandi, Could you please help me set up a 30 day monitor for our mutual patient, Ms. Hoffmann. Recent TIA 2. History of PVCs. Need to exclude atrial fibrillation thx Esmond Plants  ----- Message -----    From: Venia Carbon, MD    Sent: 08/09/2015   7:52 AM      To: Minna Merritts, MD Subject: RE: Monitor                                    I don't think she would go for the implantable one.  Can you set her up for the 30 day monitor?  ----- Message -----    From: Minna Merritts, MD    Sent: 08/08/2015   5:53 PM      To: Venia Carbon, MD Subject: Monitor                                        Options for her would be a 30 day monitor to pick up arrhythmia such as atrial fibrillation. Don't know if one of the electrical doctors such as Dr. Caryl Comes would go straight to implantable loop recorder for TIA. I have seen him do that for syncope felt secondary to arrhythmia.  Let me know which you prefer thx Tim   ----- Message -----    From: Venia Carbon, MD    Sent: 08/08/2015   3:24 PM      To: Minna Merritts, MD  Do you think she should have a loop recorder place with 2 separate TIAs recently?  Rich

## 2015-08-14 DIAGNOSIS — E876 Hypokalemia: Secondary | ICD-10-CM | POA: Diagnosis not present

## 2015-08-14 DIAGNOSIS — I10 Essential (primary) hypertension: Secondary | ICD-10-CM | POA: Diagnosis not present

## 2015-08-14 DIAGNOSIS — I63512 Cerebral infarction due to unspecified occlusion or stenosis of left middle cerebral artery: Secondary | ICD-10-CM | POA: Diagnosis not present

## 2015-08-14 DIAGNOSIS — R55 Syncope and collapse: Secondary | ICD-10-CM | POA: Diagnosis not present

## 2015-08-14 DIAGNOSIS — G459 Transient cerebral ischemic attack, unspecified: Secondary | ICD-10-CM | POA: Diagnosis not present

## 2015-08-14 DIAGNOSIS — R531 Weakness: Secondary | ICD-10-CM | POA: Diagnosis not present

## 2015-08-14 DIAGNOSIS — I360 Nonrheumatic tricuspid (valve) stenosis: Secondary | ICD-10-CM | POA: Diagnosis not present

## 2015-08-14 DIAGNOSIS — R4701 Aphasia: Secondary | ICD-10-CM | POA: Diagnosis not present

## 2015-08-14 DIAGNOSIS — E782 Mixed hyperlipidemia: Secondary | ICD-10-CM | POA: Diagnosis not present

## 2015-08-14 DIAGNOSIS — G8191 Hemiplegia, unspecified affecting right dominant side: Secondary | ICD-10-CM | POA: Diagnosis not present

## 2015-08-15 DIAGNOSIS — E780 Pure hypercholesterolemia: Secondary | ICD-10-CM | POA: Diagnosis not present

## 2015-08-15 DIAGNOSIS — I10 Essential (primary) hypertension: Secondary | ICD-10-CM | POA: Diagnosis not present

## 2015-08-15 DIAGNOSIS — E876 Hypokalemia: Secondary | ICD-10-CM | POA: Diagnosis not present

## 2015-08-15 DIAGNOSIS — G459 Transient cerebral ischemic attack, unspecified: Secondary | ICD-10-CM | POA: Diagnosis not present

## 2015-08-15 DIAGNOSIS — I63412 Cerebral infarction due to embolism of left middle cerebral artery: Secondary | ICD-10-CM | POA: Diagnosis not present

## 2015-08-16 DIAGNOSIS — R531 Weakness: Secondary | ICD-10-CM | POA: Diagnosis not present

## 2015-08-16 DIAGNOSIS — I10 Essential (primary) hypertension: Secondary | ICD-10-CM | POA: Diagnosis present

## 2015-08-16 DIAGNOSIS — R4701 Aphasia: Secondary | ICD-10-CM | POA: Diagnosis present

## 2015-08-16 DIAGNOSIS — G8191 Hemiplegia, unspecified affecting right dominant side: Secondary | ICD-10-CM | POA: Diagnosis present

## 2015-08-16 DIAGNOSIS — E876 Hypokalemia: Secondary | ICD-10-CM | POA: Diagnosis present

## 2015-08-16 DIAGNOSIS — I63412 Cerebral infarction due to embolism of left middle cerebral artery: Secondary | ICD-10-CM | POA: Diagnosis not present

## 2015-08-16 DIAGNOSIS — I63512 Cerebral infarction due to unspecified occlusion or stenosis of left middle cerebral artery: Secondary | ICD-10-CM | POA: Diagnosis not present

## 2015-08-16 DIAGNOSIS — G459 Transient cerebral ischemic attack, unspecified: Secondary | ICD-10-CM | POA: Diagnosis not present

## 2015-08-16 DIAGNOSIS — I639 Cerebral infarction, unspecified: Secondary | ICD-10-CM | POA: Diagnosis not present

## 2015-08-16 DIAGNOSIS — E782 Mixed hyperlipidemia: Secondary | ICD-10-CM | POA: Diagnosis present

## 2015-08-16 DIAGNOSIS — E780 Pure hypercholesterolemia: Secondary | ICD-10-CM | POA: Diagnosis not present

## 2015-08-17 ENCOUNTER — Ambulatory Visit: Payer: Medicare Other | Admitting: Internal Medicine

## 2015-08-17 ENCOUNTER — Encounter: Payer: Self-pay | Admitting: Internal Medicine

## 2015-08-17 ENCOUNTER — Ambulatory Visit (INDEPENDENT_AMBULATORY_CARE_PROVIDER_SITE_OTHER): Payer: Medicare Other | Admitting: Internal Medicine

## 2015-08-17 ENCOUNTER — Encounter (INDEPENDENT_AMBULATORY_CARE_PROVIDER_SITE_OTHER): Payer: Medicare Other

## 2015-08-17 VITALS — BP 120/70 | HR 92 | Temp 97.6°F | Wt 158.0 lb

## 2015-08-17 DIAGNOSIS — Z8673 Personal history of transient ischemic attack (TIA), and cerebral infarction without residual deficits: Secondary | ICD-10-CM

## 2015-08-17 DIAGNOSIS — R002 Palpitations: Secondary | ICD-10-CM | POA: Diagnosis not present

## 2015-08-17 DIAGNOSIS — I639 Cerebral infarction, unspecified: Secondary | ICD-10-CM | POA: Diagnosis not present

## 2015-08-17 DIAGNOSIS — I69959 Hemiplegia and hemiparesis following unspecified cerebrovascular disease affecting unspecified side: Secondary | ICD-10-CM | POA: Insufficient documentation

## 2015-08-17 DIAGNOSIS — G466 Pure sensory lacunar syndrome: Secondary | ICD-10-CM | POA: Diagnosis not present

## 2015-08-17 NOTE — Assessment & Plan Note (Signed)
Right sided weakness with functional problems Clear cognitive involvement as well Discussed with them the high risk for depression in future Will get the [facility] records but daughter states neurologist felt it was thrombotic Will continue the clopidogrel in addition to asa Statin Potassium supplement for now 30 event monitor to start---a fib unlikely though F/U soon PT/OT

## 2015-08-17 NOTE — Progress Notes (Signed)
Subjective:    Patient ID: Mallory Rodriguez, female    DOB: 1933-02-11, 81 y.o.   MRN: [id number]  HPI  Here with husband and daughter Mallory Rodriguez to beach and had stroke Admitted and then discharged and home yesterday evening  Right sided weakness Did have documented left side CVA on MRI (was sedated to do it) Told likely from vessel just closing off Cognitive problems as well  Needs help for bathing, dressing and using bathroom--but generally continent Walks slowly with walker  No chest pain No SOB No palpitations No dizziness or syncope  Potassium was low---on supplement now  Current Outpatient Prescriptions on File Prior to Visit  Medication Sig Dispense Refill  . aspirin EC 325 MG EC tablet Take 1 tablet (325 mg total) by mouth daily. 30 tablet 0  . CALCIUM-VITAMIN D-VITAMIN K PO Take 1 each by mouth daily.    . colestipol (COLESTID) 1 G tablet Take 2 tablets (2 g total) by mouth daily. 60 tablet 11  . cyanocobalamin (,VITAMIN B-12,) 1000 MCG/ML injection Inject 1,000 mcg into the muscle every 3 (three) months.    . diphenhydrAMINE (BENADRYL) 12.5 MG/5ML liquid Take 37.5 mg by mouth 4 (four) times daily as needed for allergies.     Marland Kitchen EPINEPHrine (EPIPEN JR 2-PAK IJ) Inject 1 Dose as directed once as needed (for severe allergic reaction).    . hydrochlorothiazide (HYDRODIURIL) 25 MG tablet Take 25 mg by mouth daily.    Marland Kitchen ibuprofen (ADVIL,MOTRIN) 200 MG tablet Take 200-400 mg by mouth every 6 (six) hours as needed for headache or mild pain.    Marland Kitchen lovastatin (MEVACOR) 40 MG tablet Take 1 tablet (40 mg total) by mouth at bedtime. 30 tablet 11  . nitroGLYCERIN (NITROLINGUAL) 0.4 MG/SPRAY spray Place 1 spray under the tongue every 5 (five) minutes as needed for chest pain.    . Omega-3 Fatty Acids (FISH OIL) 1200 MG CAPS Take 1 capsule by mouth daily.    Vladimir Faster Glycol-Propyl Glycol (SYSTANE OP) Apply 1 drop to eye as needed (for dry eyes).    . Probiotic Product (ALIGN) 4 MG CAPS Take  1 capsule by mouth daily.     No current facility-administered medications on file prior to visit.    Allergies  Allergen Reactions  . Bee Venom Anaphylaxis  . Contrast Media [Iodinated Diagnostic Agents] Hives  . Lipitor [Atorvastatin] Swelling  . Ace Inhibitors Swelling  . Hydrocodone Other (See Comments)    Reaction:  Unknown   . Macrodantin Other (See Comments)    Reaction:  Chest pain   . Neosporin [Neomycin-Bacitracin Zn-Polymyx] Other (See Comments)    Reaction:  Burning   . Sulfa Antibiotics Hives  . Epinephrine Palpitations  . Prednisone Rash and Other (See Comments)    Reaction:  Dizziness and headache     Past Medical History  Diagnosis Date  . MVP (mitral valve prolapse)     and tricuspid leak  . AP (angina pectoris)   . Hiatal hernia   . Hypogammaglobulinemia     borderline  . Pelvic kidney     lower left side  . Sciatica   . Claustrophobia   . Diverticulosis of colon   . HLD (hyperlipidemia)   . Pernicious anemia   . HTN (hypertension)   . OA (osteoarthritis)   . TIA (transient ischemic attack) 8/16    Past Surgical History  Procedure Laterality Date  . Appendectomy    . Tonsillectomy    . Cholecystectomy    .  Right ovary and tube corpus    . Dilation and curettage of uterus    . Breast lumpectomy      Right (fatty tumor)  . Inguinal hernia repair      Left side  . Excision vestibular glands    . Basal cell carcinoma removal      Back  . Intraocular lens insertion      Bilateral  . Film removed left eye    . Total abdominal hysterectomy    . Knee arthroscopy w/ partial medial meniscectomy  08/2009    right    Family History  Problem Relation Age of Onset  . Heart attack Father 25    CABG  . Dementia Mother   . Cancer Maternal Aunt     Breast-multiple aunts and Gm  . Cancer Maternal Uncle     Bladder and lung  . Diabetes Paternal Grandmother   . Hypertension Father   . Hypertension Brother     Social History   Social History   . Marital Status: Married    Spouse Name: N/A  . Number of Children: 2  . Years of Education: N/A   Occupational History  . Retired-bookkeeping/secretarial work     retired  .      Social History Main Topics  . Smoking status: Never Smoker   . Smokeless tobacco: Never Used  . Alcohol Use: Yes     Comment: Regular wine a few nights a week  . Drug Use: No  . Sexual Activity: Not on file   Other Topics Concern  . Not on file   Social History Narrative   Has living will and advance directives.    Requests husband, then daughter to make health care decisions.   Would accept resuscitation but no life prolonging technology if cognitively unaware   Review of Systems Appetite still off No sleep problems No clear cut depression at this point    Objective:   Physical Exam  Constitutional: She appears well-developed and well-nourished. No distress.  Neck: Normal range of motion. Neck supple. No thyromegaly present.  Cardiovascular: Normal rate, regular rhythm and normal heart sounds.  Exam reveals no gallop.   No murmur heard. Pulmonary/Chest: Effort normal and breath sounds normal. No respiratory distress. She has no wheezes. She has no rales.  Musculoskeletal: She exhibits no edema.  Lymphadenopathy:    She has no cervical adenopathy.  Neurological:  Mentation slowed Troubles with memory Mild right side weakness Face symmetric  Psychiatric:  Somewhat passive          Assessment & Plan:

## 2015-08-17 NOTE — Progress Notes (Signed)
Pre visit review using our clinic review tool, if applicable. No additional management support is needed unless otherwise documented below in the visit note. 

## 2015-08-24 DIAGNOSIS — I6350 Cerebral infarction due to unspecified occlusion or stenosis of unspecified cerebral artery: Secondary | ICD-10-CM | POA: Diagnosis not present

## 2015-08-24 DIAGNOSIS — R262 Difficulty in walking, not elsewhere classified: Secondary | ICD-10-CM | POA: Diagnosis not present

## 2015-08-25 ENCOUNTER — Ambulatory Visit: Payer: Medicare Other

## 2015-08-25 ENCOUNTER — Telehealth: Payer: Self-pay

## 2015-08-25 NOTE — Telephone Encounter (Signed)
Spoke with patient and advised results, she will get at her OV appt

## 2015-08-25 NOTE — Telephone Encounter (Signed)
Pt came this morning for Vit b 12 inj and pt did not get injection; pt had stroke on 08/14/15 and wants to verify that Dr Silvio Pate says it is OK for pt to continue to get B12 injections since had a stroke. I advised pt I thought it would be OK for pt to get B 12 injection today; to my knowledge Vit B 12 is not associated with reoccurring stroke but pt wanted to hear that it is OK from Dr Silvio Pate only.(Dr Silvio Pate will be in office this afternoon.) Pt request cb.

## 2015-08-25 NOTE — Telephone Encounter (Signed)
The B12 injection is fine after a stroke No reason to withhold it She doesn't have to rush in--- doesn't really have to be every month

## 2015-08-26 ENCOUNTER — Telehealth: Payer: Self-pay

## 2015-08-26 DIAGNOSIS — I6350 Cerebral infarction due to unspecified occlusion or stenosis of unspecified cerebral artery: Secondary | ICD-10-CM | POA: Diagnosis not present

## 2015-08-26 DIAGNOSIS — R262 Difficulty in walking, not elsewhere classified: Secondary | ICD-10-CM | POA: Diagnosis not present

## 2015-08-26 NOTE — Telephone Encounter (Signed)
Received message that [employer] is calling regarding serious EKG on pt.  They hung up before I got to the phone.  Will await upload and/or fax to present to [place] for review.

## 2015-08-26 NOTE — Telephone Encounter (Signed)
Sinus rhythm w/ couplet PVCs/Multifocal PVCs (17). Printed for [employer], PA's review.

## 2015-08-30 ENCOUNTER — Telehealth: Payer: Self-pay

## 2015-08-30 DIAGNOSIS — I6350 Cerebral infarction due to unspecified occlusion or stenosis of unspecified cerebral artery: Secondary | ICD-10-CM | POA: Diagnosis not present

## 2015-08-30 DIAGNOSIS — R262 Difficulty in walking, not elsewhere classified: Secondary | ICD-10-CM | POA: Diagnosis not present

## 2015-08-30 NOTE — Telephone Encounter (Signed)
Received call from [facility] that pt has serious EKG, sinus tach 105 bpm, 25 multifocal PVCs w/in 1 min. Asked her to upload to the website so I can print for Dr. Donivan Scull review.

## 2015-08-30 NOTE — Telephone Encounter (Signed)
Print out given to Dr. Rockey Situ for his review.

## 2015-08-31 ENCOUNTER — Encounter: Payer: Self-pay | Admitting: Internal Medicine

## 2015-08-31 ENCOUNTER — Ambulatory Visit (INDEPENDENT_AMBULATORY_CARE_PROVIDER_SITE_OTHER): Payer: Medicare Other | Admitting: Internal Medicine

## 2015-08-31 VITALS — BP 114/60 | HR 62 | Temp 98.1°F | Wt 153.0 lb

## 2015-08-31 DIAGNOSIS — Z23 Encounter for immunization: Secondary | ICD-10-CM

## 2015-08-31 DIAGNOSIS — I639 Cerebral infarction, unspecified: Secondary | ICD-10-CM

## 2015-08-31 DIAGNOSIS — F0150 Vascular dementia without behavioral disturbance: Secondary | ICD-10-CM | POA: Insufficient documentation

## 2015-08-31 DIAGNOSIS — D510 Vitamin B12 deficiency anemia due to intrinsic factor deficiency: Secondary | ICD-10-CM

## 2015-08-31 LAB — COMPREHENSIVE METABOLIC PANEL
ALBUMIN: 4.2 g/dL (ref 3.5–5.2)
ALT: 19 U/L (ref 0–35)
AST: 26 U/L (ref 0–37)
Alkaline Phosphatase: 54 U/L (ref 39–117)
BUN: 21 mg/dL (ref 6–23)
CHLORIDE: 100 meq/L (ref 96–112)
CO2: 30 mEq/L (ref 19–32)
Calcium: 9.9 mg/dL (ref 8.4–10.5)
Creatinine, Ser: 0.94 mg/dL (ref 0.40–1.20)
GFR: 60.94 mL/min (ref >60.00)
Glucose, Bld: 100 mg/dL — ABNORMAL HIGH (ref 70–99)
POTASSIUM: 3.5 meq/L (ref 3.5–5.1)
SODIUM: 139 meq/L (ref 135–145)
Total Bilirubin: 0.4 mg/dL (ref 0.2–1.2)
Total Protein: 7.4 g/dL (ref 6.0–8.3)

## 2015-08-31 LAB — LIPID PANEL
Cholesterol: 159 mg/dL (ref 0–200)
HDL: 66.9 mg/dL (ref >39.00)
LDL CALC: 77 mg/dL (ref 0–99)
NonHDL: 92.4
TRIGLYCERIDES: 76 mg/dL (ref 0.0–149.0)
Total CHOL/HDL Ratio: 2
VLDL: 15.2 mg/dL (ref 0.0–40.0)

## 2015-08-31 NOTE — Progress Notes (Signed)
Subjective:    Patient ID: Mallory Rodriguez, female    DOB: [birth year], 81 y.o.   MRN: [id number]  HPI Follow up from recent stroke Here with husband  Still has problems with right foot Can't walk as well early in AM or if up to bathroom at night Has pain No falls Using cane  Has the 30 day monitor on now No apparent abnormalities of note thus far  Not driving Does go out with husband Housekeeper every 2 weeks Husband still doing most of housework She does all ADLs--- husband does stand by with shower  Doesn't remember things but mentation is better Husband sees issues--but no progression (gets confused about where to go --like needs to void and goes to the laundry room)  Current Outpatient Prescriptions on File Prior to Visit  Medication Sig Dispense Refill  . aspirin EC 325 MG EC tablet Take 1 tablet (325 mg total) by mouth daily. 30 tablet 0  . CALCIUM-VITAMIN D-VITAMIN K PO Take 1 each by mouth daily.    . clopidogrel (PLAVIX) 75 MG tablet Take 75 mg by mouth daily.    . colestipol (COLESTID) 1 G tablet Take 2 tablets (2 g total) by mouth daily. 60 tablet 11  . cyanocobalamin (,VITAMIN B-12,) 1000 MCG/ML injection Inject 1,000 mcg into the muscle every 3 (three) months.    . diphenhydrAMINE (BENADRYL) 12.5 MG/5ML liquid Take 37.5 mg by mouth 4 (four) times daily as needed for allergies.     Marland Kitchen EPINEPHrine (EPIPEN JR 2-PAK IJ) Inject 1 Dose as directed once as needed (for severe allergic reaction).    . hydrochlorothiazide (HYDRODIURIL) 25 MG tablet Take 25 mg by mouth daily.    Marland Kitchen ibuprofen (ADVIL,MOTRIN) 200 MG tablet Take 200-400 mg by mouth every 6 (six) hours as needed for headache or mild pain.    Marland Kitchen lovastatin (MEVACOR) 40 MG tablet Take 1 tablet (40 mg total) by mouth at bedtime. 30 tablet 11  . nitroGLYCERIN (NITROLINGUAL) 0.4 MG/SPRAY spray Place 1 spray under the tongue every 5 (five) minutes as needed for chest pain.    . Omega-3 Fatty Acids (FISH OIL) 1200 MG CAPS  Take 1 capsule by mouth daily.    Vladimir Faster Glycol-Propyl Glycol (SYSTANE OP) Apply 1 drop to eye as needed (for dry eyes).    . Probiotic Product (ALIGN) 4 MG CAPS Take 1 capsule by mouth daily.     No current facility-administered medications on file prior to visit.    Allergies  Allergen Reactions  . Bee Venom Anaphylaxis  . Contrast Media [Iodinated Diagnostic Agents] Hives  . Lipitor [Atorvastatin] Swelling  . Ace Inhibitors Swelling  . Hydrocodone Other (See Comments)    Reaction:  Unknown   . Macrodantin Other (See Comments)    Reaction:  Chest pain   . Neosporin [Neomycin-Bacitracin Zn-Polymyx] Other (See Comments)    Reaction:  Burning   . Sulfa Antibiotics Hives  . Epinephrine Palpitations  . Prednisone Rash and Other (See Comments)    Reaction:  Dizziness and headache     Past Medical History  Diagnosis Date  . MVP (mitral valve prolapse)     and tricuspid leak  . AP (angina pectoris)   . Hiatal hernia   . Hypogammaglobulinemia     borderline  . Pelvic kidney     lower left side  . Sciatica   . Claustrophobia   . Diverticulosis of colon   . HLD (hyperlipidemia)   . Pernicious anemia   .  HTN (hypertension)   . OA (osteoarthritis)   . TIA (transient ischemic attack) 8/16    Past Surgical History  Procedure Laterality Date  . Appendectomy    . Tonsillectomy    . Cholecystectomy    . Right ovary and tube corpus    . Dilation and curettage of uterus    . Breast lumpectomy      Right (fatty tumor)  . Inguinal hernia repair      Left side  . Excision vestibular glands    . Basal cell carcinoma removal      Back  . Intraocular lens insertion      Bilateral  . Film removed left eye    . Total abdominal hysterectomy    . Knee arthroscopy w/ partial medial meniscectomy  08/2009    right    Family History  Problem Relation Age of Onset  . Heart attack Father 64    CABG  . Dementia Mother   . Cancer Maternal Aunt     Breast-multiple aunts and Gm   . Cancer Maternal Uncle     Bladder and lung  . Diabetes Paternal Grandmother   . Hypertension Father   . Hypertension Brother     Social History   Social History  . Marital Status: Married    Spouse Name: N/A  . Number of Children: 2  . Years of Education: N/A   Occupational History  . Retired-bookkeeping/secretarial work     retired  .      Social History Main Topics  . Smoking status: Never Smoker   . Smokeless tobacco: Never Used  . Alcohol Use: Yes     Comment: Regular wine a few nights a week  . Drug Use: No  . Sexual Activity: Not on file   Other Topics Concern  . Not on file   Social History Narrative   Has living will and advance directives.    Requests husband, then daughter to make health care decisions.   Would accept resuscitation but no life prolonging technology if cognitively unaware   Review of Systems  Appetite is not good Weight down 5#. She doesn't want supplements Husband notes mild depressed mood     Objective:   Physical Exam  Constitutional: She appears well-developed and well-nourished. No distress.  Neck: Normal range of motion. Neck supple. No thyromegaly present.  Cardiovascular: Normal rate, regular rhythm and normal heart sounds.  Exam reveals no gallop.   No murmur heard. Pulmonary/Chest: Effort normal and breath sounds normal. No respiratory distress. She has no wheezes. She has no rales.  Musculoskeletal:  PIP thickening and tenderness in right 3rd finger  Lymphadenopathy:    She has no cervical adenopathy.  Neurological:  Speech and thoughts are quicker today Slow but not antalgic gait Slight right hand and leg weakness  Psychiatric:  Mild psychomotor retardation          Assessment & Plan:

## 2015-08-31 NOTE — Assessment & Plan Note (Signed)
Mild BP okay On antiplatelet agents and statin Awaiting final 30 day monitor--but doubt a fib

## 2015-08-31 NOTE — Assessment & Plan Note (Addendum)
Mild improvement  Functioning okay with husband's supervision Pain in foot is main issue---will add regular tylenol Check labs with statin and low potassium in hospital  Mild depression--will watch closely to start meds prn

## 2015-08-31 NOTE — Progress Notes (Signed)
Pre visit review using our clinic review tool, if applicable. No additional management support is needed unless otherwise documented below in the visit note. 

## 2015-09-01 ENCOUNTER — Encounter: Payer: Self-pay | Admitting: *Deleted

## 2015-09-01 DIAGNOSIS — I6350 Cerebral infarction due to unspecified occlusion or stenosis of unspecified cerebral artery: Secondary | ICD-10-CM | POA: Diagnosis not present

## 2015-09-01 DIAGNOSIS — R262 Difficulty in walking, not elsewhere classified: Secondary | ICD-10-CM | POA: Diagnosis not present

## 2015-09-01 MED ORDER — CYANOCOBALAMIN 1000 MCG/ML IJ SOLN
1000.0000 ug | Freq: Once | INTRAMUSCULAR | Status: AC
  Administered 2015-08-31: 1000 ug via INTRAMUSCULAR

## 2015-09-01 NOTE — Addendum Note (Signed)
Addended by: Despina Hidden on: 09/01/2015 09:13 AM   Modules accepted: Orders

## 2015-09-02 ENCOUNTER — Telehealth: Payer: Self-pay | Admitting: Cardiovascular Disease

## 2015-09-02 NOTE — Telephone Encounter (Signed)
Received phone call from [place] regarding critical results. State pt had PVCs Report printed and given to Dr. Fletcher Anon for review.

## 2015-09-02 NOTE — Telephone Encounter (Signed)
Urgent rhythm report transferred to sharon rn

## 2015-09-07 ENCOUNTER — Telehealth: Payer: Self-pay | Admitting: Cardiovascular Disease

## 2015-09-07 NOTE — Telephone Encounter (Signed)
[facility] calling with urgent monitor results.  tx to [facility]

## 2015-09-08 ENCOUNTER — Other Ambulatory Visit: Payer: Self-pay | Admitting: Internal Medicine

## 2015-09-09 ENCOUNTER — Ambulatory Visit: Payer: Medicare Other | Admitting: Internal Medicine

## 2015-09-09 DIAGNOSIS — I6350 Cerebral infarction due to unspecified occlusion or stenosis of unspecified cerebral artery: Secondary | ICD-10-CM | POA: Diagnosis not present

## 2015-09-09 DIAGNOSIS — R262 Difficulty in walking, not elsewhere classified: Secondary | ICD-10-CM | POA: Diagnosis not present

## 2015-09-13 ENCOUNTER — Telehealth: Payer: Self-pay

## 2015-09-13 DIAGNOSIS — R262 Difficulty in walking, not elsewhere classified: Secondary | ICD-10-CM | POA: Diagnosis not present

## 2015-09-13 DIAGNOSIS — I6350 Cerebral infarction due to unspecified occlusion or stenosis of unspecified cerebral artery: Secondary | ICD-10-CM | POA: Diagnosis not present

## 2015-09-13 NOTE — Telephone Encounter (Signed)
Received call from [employer] w/ serious EKG.  Sinus rhythm w/ multifocal PVCs (17) / couplet PVCs/PACs/Couplet PACs Printed for Dr. Donivan Scull review.

## 2015-09-21 DIAGNOSIS — I6350 Cerebral infarction due to unspecified occlusion or stenosis of unspecified cerebral artery: Secondary | ICD-10-CM | POA: Diagnosis not present

## 2015-09-21 DIAGNOSIS — R262 Difficulty in walking, not elsewhere classified: Secondary | ICD-10-CM | POA: Diagnosis not present

## 2015-09-28 DIAGNOSIS — I6350 Cerebral infarction due to unspecified occlusion or stenosis of unspecified cerebral artery: Secondary | ICD-10-CM | POA: Diagnosis not present

## 2015-09-28 DIAGNOSIS — R262 Difficulty in walking, not elsewhere classified: Secondary | ICD-10-CM | POA: Diagnosis not present

## 2015-09-30 ENCOUNTER — Ambulatory Visit (INDEPENDENT_AMBULATORY_CARE_PROVIDER_SITE_OTHER): Payer: Medicare Other | Admitting: Internal Medicine

## 2015-09-30 ENCOUNTER — Encounter: Payer: Self-pay | Admitting: Internal Medicine

## 2015-09-30 VITALS — BP 122/70 | HR 93 | Temp 98.1°F | Wt 153.0 lb

## 2015-09-30 DIAGNOSIS — E785 Hyperlipidemia, unspecified: Secondary | ICD-10-CM

## 2015-09-30 DIAGNOSIS — I6350 Cerebral infarction due to unspecified occlusion or stenosis of unspecified cerebral artery: Secondary | ICD-10-CM | POA: Diagnosis not present

## 2015-09-30 DIAGNOSIS — I10 Essential (primary) hypertension: Secondary | ICD-10-CM

## 2015-09-30 DIAGNOSIS — F0150 Vascular dementia without behavioral disturbance: Secondary | ICD-10-CM

## 2015-09-30 DIAGNOSIS — I69959 Hemiplegia and hemiparesis following unspecified cerebrovascular disease affecting unspecified side: Secondary | ICD-10-CM | POA: Diagnosis not present

## 2015-09-30 DIAGNOSIS — R262 Difficulty in walking, not elsewhere classified: Secondary | ICD-10-CM | POA: Diagnosis not present

## 2015-09-30 NOTE — Assessment & Plan Note (Signed)
BP Readings from Last 3 Encounters:  09/30/15 122/70  08/31/15 114/60  08/17/15 120/70   Good control Want to maintain fairly low (preferably under 437 systolic) due to stroke

## 2015-09-30 NOTE — Progress Notes (Signed)
Pre visit review using our clinic review tool, if applicable. No additional management support is needed unless otherwise documented below in the visit note. 

## 2015-09-30 NOTE — Assessment & Plan Note (Signed)
Doing well Only minor deficits No functional limitations Seems to have been thrombotic stroke---asa, clopidogrel, statin

## 2015-09-30 NOTE — Assessment & Plan Note (Signed)
Cognitive changes are not as apparent

## 2015-09-30 NOTE — Progress Notes (Signed)
Subjective:    Patient ID: Mallory Rodriguez, female    DOB: [birth year], 81 y.o.   MRN: [id number]  HPI Here with husband for follow up of stroke  Reviewed 30 day monitor--benign No new symptoms Finishing up with the therapist  Walking better Wears out and not walking as well by the end of the day Loses voice some at end of day with fatigue  Still with some right hand weakness Leg is also weak at times--but much better  Some problems with thinking Occasional expressive aphasia No longer seems to notice a memory issue  Current Outpatient Prescriptions on File Prior to Visit  Medication Sig Dispense Refill  . acetaminophen (TYLENOL) 650 MG CR tablet Take 650 mg by mouth 3 (three) times daily.    Marland Kitchen aspirin EC 325 MG EC tablet Take 1 tablet (325 mg total) by mouth daily. 30 tablet 0  . CALCIUM-VITAMIN D-VITAMIN K PO Take 1 each by mouth daily.    . clopidogrel (PLAVIX) 75 MG tablet TAKE 1 TABLET BY MOUTH EVERY DAY 30 tablet 0  . cyanocobalamin (,VITAMIN B-12,) 1000 MCG/ML injection Inject 1,000 mcg into the muscle every 3 (three) months.    . diphenhydrAMINE (BENADRYL) 12.5 MG/5ML liquid Take 37.5 mg by mouth 4 (four) times daily as needed for allergies.     Marland Kitchen EPINEPHrine (EPIPEN JR 2-PAK IJ) Inject 1 Dose as directed once as needed (for severe allergic reaction).    . hydrochlorothiazide (HYDRODIURIL) 25 MG tablet Take 25 mg by mouth daily.    Marland Kitchen lovastatin (MEVACOR) 40 MG tablet Take 1 tablet (40 mg total) by mouth at bedtime. 30 tablet 11  . nitroGLYCERIN (NITROLINGUAL) 0.4 MG/SPRAY spray Place 1 spray under the tongue every 5 (five) minutes as needed for chest pain.    . Omega-3 Fatty Acids (FISH OIL) 1200 MG CAPS Take 1 capsule by mouth daily.    Vladimir Faster Glycol-Propyl Glycol (SYSTANE OP) Apply 1 drop to eye as needed (for dry eyes).    . Probiotic Product (ALIGN) 4 MG CAPS Take 1 capsule by mouth daily.     No current facility-administered medications on file prior to visit.     Allergies  Allergen Reactions  . Bee Venom Anaphylaxis  . Contrast Media [Iodinated Diagnostic Agents] Hives  . Lipitor [Atorvastatin] Swelling  . Ace Inhibitors Swelling  . Hydrocodone Other (See Comments)    Reaction:  Unknown   . Macrodantin Other (See Comments)    Reaction:  Chest pain   . Neosporin [Neomycin-Bacitracin Zn-Polymyx] Other (See Comments)    Reaction:  Burning   . Sulfa Antibiotics Hives  . Epinephrine Palpitations  . Prednisone Rash and Other (See Comments)    Reaction:  Dizziness and headache     Past Medical History  Diagnosis Date  . MVP (mitral valve prolapse)     and tricuspid leak  . AP (angina pectoris)   . Hiatal hernia   . Hypogammaglobulinemia     borderline  . Pelvic kidney     lower left side  . Sciatica   . Claustrophobia   . Diverticulosis of colon   . HLD (hyperlipidemia)   . Pernicious anemia   . HTN (hypertension)   . OA (osteoarthritis)   . TIA (transient ischemic attack) 8/16    Past Surgical History  Procedure Laterality Date  . Appendectomy    . Tonsillectomy    . Cholecystectomy    . Right ovary and tube corpus    . Dilation  and curettage of uterus    . Breast lumpectomy      Right (fatty tumor)  . Inguinal hernia repair      Left side  . Excision vestibular glands    . Basal cell carcinoma removal      Back  . Intraocular lens insertion      Bilateral  . Film removed left eye    . Total abdominal hysterectomy    . Knee arthroscopy w/ partial medial meniscectomy  08/2009    right    Family History  Problem Relation Age of Onset  . Heart attack Father 80    CABG  . Dementia Mother   . Cancer Maternal Aunt     Breast-multiple aunts and Gm  . Cancer Maternal Uncle     Bladder and lung  . Diabetes Paternal Grandmother   . Hypertension Father   . Hypertension Brother     Social History   Social History  . Marital Status: Married    Spouse Name: N/A  . Number of Children: 2  . Years of Education:  N/A   Occupational History  . Retired-bookkeeping/secretarial work     retired  .      Social History Main Topics  . Smoking status: Never Smoker   . Smokeless tobacco: Never Used  . Alcohol Use: Yes     Comment: Regular wine a few nights a week  . Drug Use: No  . Sexual Activity: Not on file   Other Topics Concern  . Not on file   Social History Narrative   Has living will and advance directives.    Requests husband, then daughter to make health care decisions.   Would accept resuscitation but no life prolonging technology if cognitively unaware    Review of Systems Sleeps well Appetite is good Weight stable    Objective:   Physical Exam  Constitutional: She appears well-developed and well-nourished. No distress.  Neck: Normal range of motion. Neck supple. No thyromegaly present.  Cardiovascular: Normal rate, regular rhythm and normal heart sounds.  Exam reveals no gallop.   No murmur heard. Pulmonary/Chest: Effort normal and breath sounds normal. No respiratory distress. She has no wheezes. She has no rales.  Musculoskeletal: She exhibits no edema.  Lymphadenopathy:    She has no cervical adenopathy.  Neurological:  Gait is normal other than leaning forward some Very slight right arm weakness  Psychiatric: She has a normal mood and affect. Her behavior is normal.          Assessment & Plan:

## 2015-09-30 NOTE — Assessment & Plan Note (Signed)
Continue statin. 

## 2015-10-04 ENCOUNTER — Telehealth: Payer: Self-pay

## 2015-10-04 NOTE — Telephone Encounter (Signed)
Spoke with patient and she will take anyway because per pt " I have too"

## 2015-10-04 NOTE — Telephone Encounter (Signed)
Pt left v/m; pt has dental appt on 10/05/15 for cleaning of teeth; pt usually pre medicates prior to dental appt. and pt wants to know if can take amoxicillin 500 mg taking 4 pills prior to dental appt. Pt already has medication but since pt has had a stroke request Dr Alla German OK before taking med. Pt request cb today.

## 2015-10-04 NOTE — Telephone Encounter (Signed)
It is okay for her to take the 4 amoxicillin before the dentist but the recommendations have changed-- it is no longer recommended to do premedication for people with just a leaky valve

## 2015-10-14 ENCOUNTER — Other Ambulatory Visit: Payer: Self-pay | Admitting: Internal Medicine

## 2015-10-14 NOTE — Telephone Encounter (Signed)
Pt calling to ck status of clopidogrel refill to [facility]; advised pt [facility] had requested refill and refill was sent to [facility] for 1 year. Pt will contact [facility] to get refill transferred,.

## 2015-10-17 DIAGNOSIS — L570 Actinic keratosis: Secondary | ICD-10-CM | POA: Diagnosis not present

## 2015-10-17 DIAGNOSIS — L814 Other melanin hyperpigmentation: Secondary | ICD-10-CM | POA: Diagnosis not present

## 2015-10-17 DIAGNOSIS — D1800 Hemangioma unspecified site: Secondary | ICD-10-CM | POA: Diagnosis not present

## 2015-10-17 DIAGNOSIS — L578 Other skin changes due to chronic exposure to nonionizing radiation: Secondary | ICD-10-CM | POA: Diagnosis not present

## 2015-10-17 DIAGNOSIS — L720 Epidermal cyst: Secondary | ICD-10-CM | POA: Diagnosis not present

## 2015-10-17 DIAGNOSIS — D692 Other nonthrombocytopenic purpura: Secondary | ICD-10-CM | POA: Diagnosis not present

## 2015-10-17 DIAGNOSIS — L820 Inflamed seborrheic keratosis: Secondary | ICD-10-CM | POA: Diagnosis not present

## 2015-10-17 DIAGNOSIS — L821 Other seborrheic keratosis: Secondary | ICD-10-CM | POA: Diagnosis not present

## 2015-10-17 DIAGNOSIS — Z1283 Encounter for screening for malignant neoplasm of skin: Secondary | ICD-10-CM | POA: Diagnosis not present

## 2015-10-24 ENCOUNTER — Telehealth: Payer: Self-pay | Admitting: Internal Medicine

## 2015-10-24 NOTE — Telephone Encounter (Signed)
Let her know that she does need the aspirin--and that may make her bleed and bruise easily. She can cut down to the 81mg  dose though--that should give her similar protection against stroke with less bleeding issues

## 2015-10-24 NOTE — Telephone Encounter (Signed)
Patient Name: Mallory Rodriguez  DOB: 1933-04-01    Initial Comment Caller states has bruises all over from ASA   Nurse Assessment  Nurse: Raphael Gibney, RN, Vanita Ingles Date/Time (Eastern Time): 10/24/2015 12:15:36 PM  Confirm and document reason for call. If symptomatic, describe symptoms. ---Caller states she has been taking 325 mg of ASA. She has 3 bruises on her on her legs and arm. She takes it for her heart. One may be about 2 inches.  Has the patient traveled out of the country within the last 30 days? ---Not Applicable  Does the patient have any new or worsening symptoms? ---Yes  Will a triage be completed? ---Yes  Related visit to physician within the last 2 weeks? ---No  Does the PT have any chronic conditions? (i.e. diabetes, asthma, etc.) ---Yes  List chronic conditions. ---heart condition; HTN     Guidelines    Guideline Title Affirmed Question Affirmed Notes  Bruises Taking Coumadin (warfarin) or other strong blood thinner, or known bleeding disorder (e.g., thrombocytopenia)    Final Disposition User   See Physician within 24 Hours [place], RN, Vanita Ingles    Comments  Appt scheduled for 10/25/15 at 11:15 am with Dr. [name].   Referrals  REFERRED TO PCP OFFICE   Disagree/Comply: Comply

## 2015-10-24 NOTE — Telephone Encounter (Signed)
Spoke with patient and advised results, she will cancel her appt tomorrow.

## 2015-10-25 ENCOUNTER — Ambulatory Visit: Payer: Self-pay | Admitting: Internal Medicine

## 2015-10-25 DIAGNOSIS — M9901 Segmental and somatic dysfunction of cervical region: Secondary | ICD-10-CM | POA: Diagnosis not present

## 2015-10-25 DIAGNOSIS — R51 Headache: Secondary | ICD-10-CM | POA: Diagnosis not present

## 2015-10-25 DIAGNOSIS — M9902 Segmental and somatic dysfunction of thoracic region: Secondary | ICD-10-CM | POA: Diagnosis not present

## 2015-10-25 DIAGNOSIS — M5134 Other intervertebral disc degeneration, thoracic region: Secondary | ICD-10-CM | POA: Diagnosis not present

## 2015-11-16 DIAGNOSIS — Z961 Presence of intraocular lens: Secondary | ICD-10-CM | POA: Diagnosis not present

## 2015-11-22 DIAGNOSIS — M9902 Segmental and somatic dysfunction of thoracic region: Secondary | ICD-10-CM | POA: Diagnosis not present

## 2015-11-22 DIAGNOSIS — M9901 Segmental and somatic dysfunction of cervical region: Secondary | ICD-10-CM | POA: Diagnosis not present

## 2015-11-22 DIAGNOSIS — M5134 Other intervertebral disc degeneration, thoracic region: Secondary | ICD-10-CM | POA: Diagnosis not present

## 2015-11-22 DIAGNOSIS — R51 Headache: Secondary | ICD-10-CM | POA: Diagnosis not present

## 2015-12-13 ENCOUNTER — Ambulatory Visit (INDEPENDENT_AMBULATORY_CARE_PROVIDER_SITE_OTHER): Payer: Medicare Other | Admitting: Internal Medicine

## 2015-12-13 ENCOUNTER — Encounter: Payer: Self-pay | Admitting: Internal Medicine

## 2015-12-13 VITALS — BP 140/80 | HR 59 | Temp 97.5°F | Ht 60.0 in | Wt 150.0 lb

## 2015-12-13 DIAGNOSIS — I69959 Hemiplegia and hemiparesis following unspecified cerebrovascular disease affecting unspecified side: Secondary | ICD-10-CM

## 2015-12-13 DIAGNOSIS — Z7189 Other specified counseling: Secondary | ICD-10-CM

## 2015-12-13 DIAGNOSIS — F39 Unspecified mood [affective] disorder: Secondary | ICD-10-CM

## 2015-12-13 DIAGNOSIS — I10 Essential (primary) hypertension: Secondary | ICD-10-CM | POA: Diagnosis not present

## 2015-12-13 DIAGNOSIS — D510 Vitamin B12 deficiency anemia due to intrinsic factor deficiency: Secondary | ICD-10-CM | POA: Diagnosis not present

## 2015-12-13 DIAGNOSIS — Z0000 Encounter for general adult medical examination without abnormal findings: Secondary | ICD-10-CM

## 2015-12-13 MED ORDER — CYANOCOBALAMIN 1000 MCG/ML IJ SOLN
1000.0000 ug | Freq: Once | INTRAMUSCULAR | Status: AC
  Administered 2015-12-13: 1000 ug via INTRAMUSCULAR

## 2015-12-13 NOTE — Assessment & Plan Note (Signed)
BP Readings from Last 3 Encounters:  12/13/15 140/80  09/30/15 122/70  08/31/15 114/60   Good control No change needed

## 2015-12-13 NOTE — Progress Notes (Signed)
Subjective:    Patient ID: Mallory Rodriguez, female    DOB: [birth year], 81 y.o.   MRN: [id number]  HPI Here for Medicare wellness and follow up of chronic medical conditions Reviewed form and advanced directives Reviewed other doctors Occasional glass of wine No tobacco No exercise-- "I won't even try it" No vision Some hearing problems--doesn't use aide much Has ongoing memory issues Will drive alone locally Does most of her instrumental ADLs Fell with stroke--but didn't have injury  Still not back to her former baseline Still has trouble raising and using the right hand Leg is not normal either--but able to walk without cane (discussed that she should use it) Trouble going up stairs Memory and thinking are better  No chest pain No SOB No dizziness or syncope No palpitaitons regularly--rare brief spells   Still shakes Mostly right hand though  Mood is okay Anxiety seems better No depression  Limited in what she can do--but not anhedonic Doesn't watch grandkids as much  Current Outpatient Prescriptions on File Prior to Visit  Medication Sig Dispense Refill  . acetaminophen (TYLENOL) 650 MG CR tablet Take 650 mg by mouth 3 (three) times daily.    Marland Kitchen aspirin EC 325 MG EC tablet Take 1 tablet (325 mg total) by mouth daily. 30 tablet 0  . CALCIUM-VITAMIN D-VITAMIN K PO Take 1 each by mouth daily.    . clopidogrel (PLAVIX) 75 MG tablet TAKE 1 TABLET BY MOUTH EVERY DAY 30 tablet 11  . cyanocobalamin (,VITAMIN B-12,) 1000 MCG/ML injection Inject 1,000 mcg into the muscle every 3 (three) months.    . diphenhydrAMINE (BENADRYL) 12.5 MG/5ML liquid Take 37.5 mg by mouth 4 (four) times daily as needed for allergies.     Marland Kitchen EPINEPHrine (EPIPEN JR 2-PAK IJ) Inject 1 Dose as directed once as needed (for severe allergic reaction).    . hydrochlorothiazide (HYDRODIURIL) 25 MG tablet Take 25 mg by mouth daily.    Marland Kitchen lovastatin (MEVACOR) 40 MG tablet Take 1 tablet (40 mg total) by mouth at  bedtime. 30 tablet 11  . nitroGLYCERIN (NITROLINGUAL) 0.4 MG/SPRAY spray Place 1 spray under the tongue every 5 (five) minutes as needed for chest pain.    . Omega-3 Fatty Acids (FISH OIL) 1200 MG CAPS Take 1 capsule by mouth daily.    Vladimir Faster Glycol-Propyl Glycol (SYSTANE OP) Apply 1 drop to eye as needed (for dry eyes).    . Probiotic Product (ALIGN) 4 MG CAPS Take 1 capsule by mouth daily.     No current facility-administered medications on file prior to visit.    Allergies  Allergen Reactions  . Bee Venom Anaphylaxis  . Contrast Media [Iodinated Diagnostic Agents] Hives  . Lipitor [Atorvastatin] Swelling  . Ace Inhibitors Swelling  . Hydrocodone Other (See Comments)    Reaction:  Unknown   . Macrodantin Other (See Comments)    Reaction:  Chest pain   . Neosporin [Neomycin-Bacitracin Zn-Polymyx] Other (See Comments)    Reaction:  Burning   . Sulfa Antibiotics Hives  . Epinephrine Palpitations  . Prednisone Rash and Other (See Comments)    Reaction:  Dizziness and headache     Past Medical History  Diagnosis Date  . MVP (mitral valve prolapse)     and tricuspid leak  . AP (angina pectoris) ([place])   . Hiatal hernia   . Hypogammaglobulinemia ([place])     borderline  . Pelvic kidney     lower left side  . Sciatica   .  Claustrophobia   . Diverticulosis of colon   . HLD (hyperlipidemia)   . Pernicious anemia   . HTN (hypertension)   . OA (osteoarthritis)   . TIA (transient ischemic attack) 8/16    Past Surgical History  Procedure Laterality Date  . Appendectomy    . Tonsillectomy    . Cholecystectomy    . Right ovary and tube corpus    . Dilation and curettage of uterus    . Breast lumpectomy      Right (fatty tumor)  . Inguinal hernia repair      Left side  . Excision vestibular glands    . Basal cell carcinoma removal      Back  . Intraocular lens insertion      Bilateral  . Film removed left eye    . Total abdominal hysterectomy    . Knee arthroscopy  w/ partial medial meniscectomy  08/2009    right    Family History  Problem Relation Age of Onset  . Heart attack Father 77    CABG  . Dementia Mother   . Cancer Maternal Aunt     Breast-multiple aunts and Gm  . Cancer Maternal Uncle     Bladder and lung  . Diabetes Paternal Grandmother   . Hypertension Father   . Hypertension Brother     Social History   Social History  . Marital Status: Married    Spouse Name: N/A  . Number of Children: 2  . Years of Education: N/A   Occupational History  . Retired-bookkeeping/secretarial work     retired  .      Social History Main Topics  . Smoking status: Never Smoker   . Smokeless tobacco: Never Used  . Alcohol Use: Yes     Comment: Regular wine a few nights a week  . Drug Use: No  . Sexual Activity: Not on file   Other Topics Concern  . Not on file   Social History Narrative   Has living will and advance directives.    Requests husband, then daughter to make health care decisions.   Would accept resuscitation but no life prolonging technology if cognitively unaware   Review of Systems Sleeps well Appetite is fair Lost a little weight Wears seat belt Teeth okay--delaying dental visit till 6 months after stroke Bowels are okay No urinary problems except nocturia x 3. Frequency in day is same as always Little red bumps on skin--- go away on their own Some right knee arthritis and back pain--uses tylenol    Objective:   Physical Exam  Constitutional: She is oriented to person, place, and time. She appears well-developed and well-nourished. No distress.  HENT:  Mouth/Throat: Oropharynx is clear and moist. No oropharyngeal exudate.  Neck: Normal range of motion. Neck supple. No thyromegaly present.  Cardiovascular: Normal rate, regular rhythm, normal heart sounds and intact distal pulses.  Exam reveals no gallop.   No murmur heard. Pulmonary/Chest: Effort normal and breath sounds normal. No respiratory distress. She  has no wheezes. She has no rales.  Abdominal: Soft. There is no tenderness.  Musculoskeletal: She exhibits no edema or tenderness.  Lymphadenopathy:    She has no cervical adenopathy.  Neurological: She is alert and oriented to person, place, and time.  President-- "Obama, Bush, Clinton" [phone] [id number] Recall 3/3  Mild functional problems with right side  Skin: No rash noted. No erythema.  Psychiatric: She has a normal mood and affect. Her behavior is normal.  Assessment & Plan:

## 2015-12-13 NOTE — Assessment & Plan Note (Signed)
See social history 

## 2015-12-13 NOTE — Progress Notes (Signed)
Pre visit review using our clinic review tool, if applicable. No additional management support is needed unless otherwise documented below in the visit note. 

## 2015-12-13 NOTE — Assessment & Plan Note (Signed)
I have personally reviewed the Medicare Annual Wellness questionnaire and have noted 1. The patient's medical and social history 2. Their use of alcohol, tobacco or illicit drugs 3. Their current medications and supplements 4. The patient's functional ability including ADL's, fall risks, home safety risks and hearing or visual             impairment. 5. Diet and physical activities 6. Evidence for depression or mood disorders  The patients weight, height, BMI and visual acuity have been recorded in the chart I have made referrals, counseling and provided education to the patient based review of the above and I have provided the pt with a written personalized care plan for preventive services.  I have provided you with a copy of your personalized plan for preventive services. Please take the time to review along with your updated medication list.  No cancer screening due to age UTD on immunizations Counseled about exercise

## 2015-12-13 NOTE — Assessment & Plan Note (Signed)
Chronic anxiety is better Mild dysthymia since stroke--not enough for Rx

## 2015-12-13 NOTE — Assessment & Plan Note (Signed)
Will continue vit B12 shots Probably can go just every 3 months

## 2015-12-13 NOTE — Assessment & Plan Note (Signed)
Stable now On statin and plavix

## 2015-12-13 NOTE — Addendum Note (Signed)
Addended by: Despina Hidden on: 12/13/2015 11:15 AM   Modules accepted: Orders

## 2015-12-22 DIAGNOSIS — M9902 Segmental and somatic dysfunction of thoracic region: Secondary | ICD-10-CM | POA: Diagnosis not present

## 2015-12-22 DIAGNOSIS — M5134 Other intervertebral disc degeneration, thoracic region: Secondary | ICD-10-CM | POA: Diagnosis not present

## 2015-12-22 DIAGNOSIS — M9901 Segmental and somatic dysfunction of cervical region: Secondary | ICD-10-CM | POA: Diagnosis not present

## 2015-12-22 DIAGNOSIS — R51 Headache: Secondary | ICD-10-CM | POA: Diagnosis not present

## 2015-12-29 ENCOUNTER — Encounter: Payer: Self-pay | Admitting: *Deleted

## 2016-01-24 DIAGNOSIS — R51 Headache: Secondary | ICD-10-CM | POA: Diagnosis not present

## 2016-01-24 DIAGNOSIS — M5134 Other intervertebral disc degeneration, thoracic region: Secondary | ICD-10-CM | POA: Diagnosis not present

## 2016-01-24 DIAGNOSIS — M9902 Segmental and somatic dysfunction of thoracic region: Secondary | ICD-10-CM | POA: Diagnosis not present

## 2016-01-24 DIAGNOSIS — M9901 Segmental and somatic dysfunction of cervical region: Secondary | ICD-10-CM | POA: Diagnosis not present

## 2016-02-13 ENCOUNTER — Ambulatory Visit: Payer: Medicare Other | Admitting: Cardiovascular Disease

## 2016-02-17 DIAGNOSIS — R234 Changes in skin texture: Secondary | ICD-10-CM | POA: Diagnosis not present

## 2016-02-17 DIAGNOSIS — L659 Nonscarring hair loss, unspecified: Secondary | ICD-10-CM | POA: Diagnosis not present

## 2016-02-17 DIAGNOSIS — L570 Actinic keratosis: Secondary | ICD-10-CM | POA: Diagnosis not present

## 2016-02-17 DIAGNOSIS — L650 Telogen effluvium: Secondary | ICD-10-CM | POA: Diagnosis not present

## 2016-02-21 DIAGNOSIS — R51 Headache: Secondary | ICD-10-CM | POA: Diagnosis not present

## 2016-02-21 DIAGNOSIS — M5134 Other intervertebral disc degeneration, thoracic region: Secondary | ICD-10-CM | POA: Diagnosis not present

## 2016-02-21 DIAGNOSIS — M9902 Segmental and somatic dysfunction of thoracic region: Secondary | ICD-10-CM | POA: Diagnosis not present

## 2016-02-21 DIAGNOSIS — M9901 Segmental and somatic dysfunction of cervical region: Secondary | ICD-10-CM | POA: Diagnosis not present

## 2016-02-23 ENCOUNTER — Encounter: Payer: Self-pay | Admitting: Family Medicine

## 2016-02-23 ENCOUNTER — Ambulatory Visit (INDEPENDENT_AMBULATORY_CARE_PROVIDER_SITE_OTHER): Payer: Medicare Other | Admitting: Family Medicine

## 2016-02-23 VITALS — BP 130/68 | HR 68 | Temp 98.1°F | Ht 60.0 in | Wt 153.0 lb

## 2016-02-23 DIAGNOSIS — I69959 Hemiplegia and hemiparesis following unspecified cerebrovascular disease affecting unspecified side: Secondary | ICD-10-CM | POA: Diagnosis not present

## 2016-02-23 DIAGNOSIS — S0012XA Contusion of left eyelid and periocular area, initial encounter: Secondary | ICD-10-CM | POA: Diagnosis not present

## 2016-02-23 NOTE — Progress Notes (Signed)
Pre visit review using our clinic review tool, if applicable. No additional management support is needed unless otherwise documented below in the visit note. 

## 2016-02-23 NOTE — Assessment & Plan Note (Signed)
Handicap place card form complete per pt request.

## 2016-02-23 NOTE — Patient Instructions (Signed)
Expect easy bruising with plavix ! No treatment needed.

## 2016-02-23 NOTE — Progress Notes (Signed)
   Subjective:    Patient ID: Mallory Rodriguez, female    DOB: 1933-06-09, 82 y.o.   MRN: [id number]  HPI  82 year old female pt of Dr. Silvio Pate with history of CVA on palvix presents with new onset bruising in right medial eyelid.  She noted it first last night.. No pain, no vision change, no eye symptoms. No burning , no discharge. No known injury, but She does rub eyes a lot. No other gum bleeding, no blood in stool, no blood in urine.    She has has been having easy bruising since starting plavix last 08/2015 after the CVA.  Monday she also took tylenol arthritis for finger.   Review of Systems  Constitutional: Negative for fever and fatigue.  HENT: Negative for ear pain.   Eyes: Negative for pain.  Respiratory: Negative for chest tightness and shortness of breath.   Cardiovascular: Negative for chest pain, palpitations and leg swelling.  Gastrointestinal: Negative for abdominal pain.  Genitourinary: Negative for dysuria.       Objective:   Physical Exam  Constitutional: Vital signs are normal. She appears well-developed and well-nourished. She is cooperative.  Non-toxic appearance. She does not appear ill. No distress.  HENT:  Head: Normocephalic.  Right Ear: Hearing, tympanic membrane, external ear and ear canal normal. Tympanic membrane is not erythematous, not retracted and not bulging.  Left Ear: Hearing, tympanic membrane, external ear and ear canal normal. Tympanic membrane is not erythematous, not retracted and not bulging.  Nose: No mucosal edema or rhinorrhea. Right sinus exhibits no maxillary sinus tenderness and no frontal sinus tenderness. Left sinus exhibits no maxillary sinus tenderness and no frontal sinus tenderness.  Mouth/Throat: Uvula is midline, oropharynx is clear and moist and mucous membranes are normal.  Eyes: Conjunctivae and EOM are normal. Pupils are equal, round, and reactive to light.  Fundoscopic exam:      The right eye shows no AV nicking and no  papilledema.       The left eye shows no AV nicking and no papilledema.  No bleeding in eyes bilaterally  Left eylid contusion   Neck: Trachea normal and normal range of motion. Neck supple. Carotid bruit is not present. No thyroid mass and no thyromegaly present.  Cardiovascular: Normal rate, regular rhythm, S1 normal, S2 normal, normal heart sounds, intact distal pulses and normal pulses.  Exam reveals no gallop and no friction rub.   No murmur heard. Pulmonary/Chest: Effort normal and breath sounds normal. No tachypnea. No respiratory distress. She has no decreased breath sounds. She has no wheezes. She has no rhonchi. She has no rales.  Abdominal: Soft. Normal appearance and bowel sounds are normal. There is no tenderness.  Neurological: She is alert.  Skin: Skin is warm, dry and intact. No rash noted.  Contusion left eyelid.  Psychiatric: Her speech is normal and behavior is normal. Judgment and thought content normal. Her mood appears not anxious. Cognition and memory are normal. She does not exhibit a depressed mood.          Assessment & Plan:

## 2016-02-23 NOTE — Assessment & Plan Note (Signed)
No hematoma. Contusion due to plavix and rubbing eye. NO treatment needed.

## 2016-03-05 ENCOUNTER — Ambulatory Visit (INDEPENDENT_AMBULATORY_CARE_PROVIDER_SITE_OTHER): Payer: Medicare Other | Admitting: Internal Medicine

## 2016-03-05 ENCOUNTER — Encounter: Payer: Self-pay | Admitting: Internal Medicine

## 2016-03-05 VITALS — BP 128/80 | HR 66 | Temp 98.5°F | Resp 18 | Wt 150.0 lb

## 2016-03-05 DIAGNOSIS — J069 Acute upper respiratory infection, unspecified: Secondary | ICD-10-CM | POA: Diagnosis not present

## 2016-03-05 NOTE — Assessment & Plan Note (Signed)
With left ear involvement No antibiotics appropriate Discussed supportive Rx---tylenol, ?lower dose of DM ENT if worsens

## 2016-03-05 NOTE — Progress Notes (Signed)
Subjective:    Patient ID: Mallory Rodriguez, female    DOB: 10-25-1933, 82 y.o.   MRN: [id number]  HPI Here due to cough  She feels she has a cold Cough and hears buzzing by left ear--- 2 days of this No fever Some chills Vomited once--yesterday Has some nasal drainage and PND No SOB Some pain in left ear--- sensitive when she touches No change in  Slight sore throat  Has tried mucinex DM---helps cough but makes her sleepy  Current Outpatient Prescriptions on File Prior to Visit  Medication Sig Dispense Refill  . acetaminophen (TYLENOL) 650 MG CR tablet Take 650 mg by mouth 3 (three) times daily.    Marland Kitchen amoxicillin (AMOXIL) 500 MG capsule TAKE 4 CAPSULES BY MOUTH 1 HOUR PRIOR TO DENTAL APPT  1  . aspirin EC 325 MG EC tablet Take 1 tablet (325 mg total) by mouth daily. 30 tablet 0  . CALCIUM-VITAMIN D-VITAMIN K PO Take 1 each by mouth daily.    . clopidogrel (PLAVIX) 75 MG tablet TAKE 1 TABLET BY MOUTH EVERY DAY 30 tablet 11  . cyanocobalamin (,VITAMIN B-12,) 1000 MCG/ML injection Inject 1,000 mcg into the muscle every 3 (three) months.    . diphenhydrAMINE (BENADRYL) 12.5 MG/5ML liquid Take 37.5 mg by mouth 4 (four) times daily as needed for allergies.     Marland Kitchen EPINEPHrine (EPIPEN JR 2-PAK IJ) Inject 1 Dose as directed once as needed (for severe allergic reaction).    . hydrochlorothiazide (HYDRODIURIL) 25 MG tablet Take 25 mg by mouth daily.    Marland Kitchen lovastatin (MEVACOR) 40 MG tablet Take 1 tablet (40 mg total) by mouth at bedtime. 30 tablet 11  . nitroGLYCERIN (NITROLINGUAL) 0.4 MG/SPRAY spray Place 1 spray under the tongue every 5 (five) minutes as needed for chest pain.    . Omega-3 Fatty Acids (FISH OIL) 1200 MG CAPS Take 1 capsule by mouth daily.    Vladimir Faster Glycol-Propyl Glycol (SYSTANE OP) Apply 1 drop to eye as needed (for dry eyes).    . Probiotic Product (ALIGN) 4 MG CAPS Take 1 capsule by mouth daily.     No current facility-administered medications on file prior to visit.     Allergies  Allergen Reactions  . Bee Venom Anaphylaxis  . Contrast Media [Iodinated Diagnostic Agents] Hives  . Lipitor [Atorvastatin] Swelling  . Ace Inhibitors Swelling  . Hydrocodone Other (See Comments)    Reaction:  Unknown   . Macrodantin Other (See Comments)    Reaction:  Chest pain   . Neosporin [Neomycin-Bacitracin Zn-Polymyx] Other (See Comments)    Reaction:  Burning   . Sulfa Antibiotics Hives  . Epinephrine Palpitations  . Prednisone Rash and Other (See Comments)    Reaction:  Dizziness and headache     Past Medical History  Diagnosis Date  . MVP (mitral valve prolapse)     and tricuspid leak  . AP (angina pectoris) ([place])   . Hiatal hernia   . Hypogammaglobulinemia ([place])     borderline  . Pelvic kidney     lower left side  . Sciatica   . Claustrophobia   . Diverticulosis of colon   . HLD (hyperlipidemia)   . Pernicious anemia   . HTN (hypertension)   . OA (osteoarthritis)   . TIA (transient ischemic attack) 8/16    Past Surgical History  Procedure Laterality Date  . Appendectomy    . Tonsillectomy    . Cholecystectomy    . Right ovary and  tube corpus    . Dilation and curettage of uterus    . Breast lumpectomy      Right (fatty tumor)  . Inguinal hernia repair      Left side  . Excision vestibular glands    . Basal cell carcinoma removal      Back  . Intraocular lens insertion      Bilateral  . Film removed left eye    . Total abdominal hysterectomy    . Knee arthroscopy w/ partial medial meniscectomy  08/2009    right    Family History  Problem Relation Age of Onset  . Heart attack Father 63    CABG  . Dementia Mother   . Cancer Maternal Aunt     Breast-multiple aunts and Gm  . Cancer Maternal Uncle     Bladder and lung  . Diabetes Paternal Grandmother   . Hypertension Father   . Hypertension Brother     Social History   Social History  . Marital Status: Married    Spouse Name: N/A  . Number of Children: 2  . Years of  Education: N/A   Occupational History  . Retired-bookkeeping/secretarial work     retired  .      Social History Main Topics  . Smoking status: Never Smoker   . Smokeless tobacco: Never Used  . Alcohol Use: Yes     Comment: Regular wine a few nights a week  . Drug Use: No  . Sexual Activity: Not on file   Other Topics Concern  . Not on file   Social History Narrative   Has living will and advance directives.    Requests husband, then daughter to make health care decisions.   Would accept resuscitation but no life prolonging technology if cognitively unaware   Review of Systems Appetite is off Had loose stool--she thinks it may have been from the cough med No rash    Objective:   Physical Exam  Constitutional: She appears well-developed. No distress.  occ cough  HENT:  Mouth/Throat: Oropharynx is clear and moist. No oropharyngeal exudate.  No sinus tenderness No tragal tenderness--slight redness in superior portion of left TM Right TM/canal normal Mild nasal congestion  Neck: Normal range of motion. Neck supple.  Pulmonary/Chest: Effort normal and breath sounds normal. No respiratory distress. She has no wheezes. She has no rales.  Lymphadenopathy:    She has no cervical adenopathy.  Skin: No rash noted.          Assessment & Plan:

## 2016-03-05 NOTE — Patient Instructions (Signed)
I can set you up with an ENT doctor if your buzzing isn't gone within 2 weeks or so.

## 2016-03-05 NOTE — Progress Notes (Signed)
Pre visit review using our clinic review tool, if applicable. No additional management support is needed unless otherwise documented below in the visit note. 

## 2016-03-13 ENCOUNTER — Ambulatory Visit: Payer: Medicare Other

## 2016-03-14 ENCOUNTER — Ambulatory Visit (INDEPENDENT_AMBULATORY_CARE_PROVIDER_SITE_OTHER): Payer: Medicare Other

## 2016-03-14 DIAGNOSIS — E538 Deficiency of other specified B group vitamins: Secondary | ICD-10-CM

## 2016-03-14 MED ORDER — CYANOCOBALAMIN 1000 MCG/ML IJ SOLN
1000.0000 ug | Freq: Once | INTRAMUSCULAR | Status: AC
  Administered 2016-03-14: 1000 ug via INTRAMUSCULAR

## 2016-03-20 DIAGNOSIS — M5134 Other intervertebral disc degeneration, thoracic region: Secondary | ICD-10-CM | POA: Diagnosis not present

## 2016-03-20 DIAGNOSIS — M9902 Segmental and somatic dysfunction of thoracic region: Secondary | ICD-10-CM | POA: Diagnosis not present

## 2016-03-20 DIAGNOSIS — R51 Headache: Secondary | ICD-10-CM | POA: Diagnosis not present

## 2016-03-20 DIAGNOSIS — M9901 Segmental and somatic dysfunction of cervical region: Secondary | ICD-10-CM | POA: Diagnosis not present

## 2016-03-23 ENCOUNTER — Ambulatory Visit (INDEPENDENT_AMBULATORY_CARE_PROVIDER_SITE_OTHER): Payer: Medicare Other | Admitting: Cardiovascular Disease

## 2016-03-23 ENCOUNTER — Encounter (INDEPENDENT_AMBULATORY_CARE_PROVIDER_SITE_OTHER): Payer: Self-pay

## 2016-03-23 ENCOUNTER — Encounter: Payer: Self-pay | Admitting: Cardiovascular Disease

## 2016-03-23 VITALS — BP 138/60 | HR 76 | Ht 62.0 in | Wt 154.2 lb

## 2016-03-23 DIAGNOSIS — I10 Essential (primary) hypertension: Secondary | ICD-10-CM

## 2016-03-23 DIAGNOSIS — I6300 Cerebral infarction due to thrombosis of unspecified precerebral artery: Secondary | ICD-10-CM | POA: Diagnosis not present

## 2016-03-23 DIAGNOSIS — I059 Rheumatic mitral valve disease, unspecified: Secondary | ICD-10-CM

## 2016-03-23 DIAGNOSIS — E785 Hyperlipidemia, unspecified: Secondary | ICD-10-CM | POA: Diagnosis not present
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
## 2016-03-23 NOTE — Assessment & Plan Note (Signed)
Blood pressure is well controlled on today's visit. No changes made to the medications. 

## 2016-03-23 NOTE — Patient Instructions (Signed)
You are doing well. No medication changes were made.  Please call us if you have new issues that need to be addressed before your next appt.  Your physician wants you to follow-up in: 12 months.  You will receive a reminder letter in the mail two months in advance. If you don't receive a letter, please call our office to schedule the follow-up appointment. 

## 2016-03-23 NOTE — Assessment & Plan Note (Addendum)
Previous stroke August 2016, still with residual right-sided deficits Prior hospital and testing records reviewed Etiology unclear, Suggested we stay vigilant for any arrhythmia She is not interested in implantable reveal device   Total encounter time more than 25 minutes  Greater than 50% was spent in counseling and coordination of care with the patient

## 2016-03-23 NOTE — Assessment & Plan Note (Signed)
Cholesterol is at goal on the current lipid regimen. No changes to the medications were made.  

## 2016-04-11 DIAGNOSIS — M2041 Other hammer toe(s) (acquired), right foot: Secondary | ICD-10-CM | POA: Diagnosis not present

## 2016-04-11 DIAGNOSIS — M79674 Pain in right toe(s): Secondary | ICD-10-CM | POA: Diagnosis not present

## 2016-04-11 DIAGNOSIS — L851 Acquired keratosis [keratoderma] palmaris et plantaris: Secondary | ICD-10-CM | POA: Diagnosis not present

## 2016-04-17 DIAGNOSIS — M9901 Segmental and somatic dysfunction of cervical region: Secondary | ICD-10-CM | POA: Diagnosis not present

## 2016-04-17 DIAGNOSIS — R51 Headache: Secondary | ICD-10-CM | POA: Diagnosis not present

## 2016-04-17 DIAGNOSIS — M9902 Segmental and somatic dysfunction of thoracic region: Secondary | ICD-10-CM | POA: Diagnosis not present

## 2016-04-17 DIAGNOSIS — M5134 Other intervertebral disc degeneration, thoracic region: Secondary | ICD-10-CM | POA: Diagnosis not present

## 2016-04-27 ENCOUNTER — Other Ambulatory Visit: Payer: Self-pay | Admitting: Internal Medicine

## 2016-04-27 NOTE — Telephone Encounter (Signed)
Rx sent electronically.  

## 2016-05-15 DIAGNOSIS — M5136 Other intervertebral disc degeneration, lumbar region: Secondary | ICD-10-CM | POA: Diagnosis not present

## 2016-05-15 DIAGNOSIS — M62830 Muscle spasm of back: Secondary | ICD-10-CM | POA: Diagnosis not present

## 2016-05-15 DIAGNOSIS — M9903 Segmental and somatic dysfunction of lumbar region: Secondary | ICD-10-CM | POA: Diagnosis not present

## 2016-05-15 DIAGNOSIS — M9905 Segmental and somatic dysfunction of pelvic region: Secondary | ICD-10-CM | POA: Diagnosis not present

## 2016-05-16 DIAGNOSIS — M62830 Muscle spasm of back: Secondary | ICD-10-CM | POA: Diagnosis not present

## 2016-05-16 DIAGNOSIS — M9903 Segmental and somatic dysfunction of lumbar region: Secondary | ICD-10-CM | POA: Diagnosis not present

## 2016-05-16 DIAGNOSIS — M9905 Segmental and somatic dysfunction of pelvic region: Secondary | ICD-10-CM | POA: Diagnosis not present

## 2016-05-16 DIAGNOSIS — M5136 Other intervertebral disc degeneration, lumbar region: Secondary | ICD-10-CM | POA: Diagnosis not present

## 2016-05-17 DIAGNOSIS — M9905 Segmental and somatic dysfunction of pelvic region: Secondary | ICD-10-CM | POA: Diagnosis not present

## 2016-05-17 DIAGNOSIS — M62830 Muscle spasm of back: Secondary | ICD-10-CM | POA: Diagnosis not present

## 2016-05-17 DIAGNOSIS — M9903 Segmental and somatic dysfunction of lumbar region: Secondary | ICD-10-CM | POA: Diagnosis not present

## 2016-05-17 DIAGNOSIS — M5136 Other intervertebral disc degeneration, lumbar region: Secondary | ICD-10-CM | POA: Diagnosis not present

## 2016-05-21 ENCOUNTER — Encounter: Payer: Self-pay | Admitting: Internal Medicine

## 2016-05-21 ENCOUNTER — Ambulatory Visit (INDEPENDENT_AMBULATORY_CARE_PROVIDER_SITE_OTHER): Payer: Medicare Other | Admitting: Internal Medicine

## 2016-05-21 VITALS — BP 136/94 | HR 74 | Temp 98.0°F | Wt 154.0 lb

## 2016-05-21 DIAGNOSIS — I6300 Cerebral infarction due to thrombosis of unspecified precerebral artery: Secondary | ICD-10-CM | POA: Diagnosis not present

## 2016-05-21 DIAGNOSIS — M62830 Muscle spasm of back: Secondary | ICD-10-CM | POA: Diagnosis not present

## 2016-05-21 DIAGNOSIS — M9905 Segmental and somatic dysfunction of pelvic region: Secondary | ICD-10-CM | POA: Diagnosis not present

## 2016-05-21 DIAGNOSIS — M9903 Segmental and somatic dysfunction of lumbar region: Secondary | ICD-10-CM | POA: Diagnosis not present

## 2016-05-21 DIAGNOSIS — M5136 Other intervertebral disc degeneration, lumbar region: Secondary | ICD-10-CM | POA: Diagnosis not present

## 2016-05-21 DIAGNOSIS — R300 Dysuria: Secondary | ICD-10-CM | POA: Diagnosis not present

## 2016-05-21 LAB — POC URINALSYSI DIPSTICK (AUTOMATED)
Bilirubin, UA: NEGATIVE
Blood, UA: NEGATIVE
GLUCOSE UA: NEGATIVE
Ketones, UA: NEGATIVE
NITRITE UA: POSITIVE
Protein, UA: NEGATIVE
SPEC GRAV UA: 1.02
pH, UA: 6

## 2016-05-21 MED ORDER — CEFUROXIME AXETIL 250 MG PO TABS: 250.0000 mg | ORAL_TABLET | Freq: Two times a day (BID) | ORAL | Status: DC

## 2016-05-21 NOTE — Progress Notes (Signed)
Subjective:    Patient ID: Mallory Rodriguez, female    DOB: 1933-12-16, 82 y.o.   MRN: [id number]  HPI Here due to urinary symptoms  Having burning dysuria Started 2 days ago No blood in urine Color is different-- darker yellow No fever Some urgency-- and increased frequency  Has been using azo--helps a little No other treatment  Current Outpatient Prescriptions on File Prior to Visit  Medication Sig Dispense Refill  . acetaminophen (TYLENOL) 650 MG CR tablet Take 650 mg by mouth 3 (three) times daily.    Marland Kitchen amoxicillin (AMOXIL) 500 MG capsule TAKE 4 CAPSULES BY MOUTH 1 HOUR PRIOR TO DENTAL APPT  1  . aspirin 81 MG tablet Take 81 mg by mouth daily.    Marland Kitchen CALCIUM-VITAMIN D-VITAMIN K PO Take 1 each by mouth daily.    . clopidogrel (PLAVIX) 75 MG tablet TAKE 1 TABLET BY MOUTH EVERY DAY 30 tablet 11  . cyanocobalamin (,VITAMIN B-12,) 1000 MCG/ML injection Inject 1,000 mcg into the muscle every 3 (three) months.    . diphenhydrAMINE (BENADRYL) 12.5 MG/5ML liquid Take 37.5 mg by mouth 4 (four) times daily as needed for allergies.     Marland Kitchen EPINEPHrine (EPIPEN JR) 0.15 MG/0.3ML injection INJECT ONE SYRINGEFUL INTRAMUSCULARLY AS NEEDED FOR  ANAPHYLAXIS 2 each 0  . hydrochlorothiazide (HYDRODIURIL) 25 MG tablet Take 25 mg by mouth daily.    Marland Kitchen lovastatin (MEVACOR) 40 MG tablet Take 1 tablet (40 mg total) by mouth at bedtime. 30 tablet 11  . nitroGLYCERIN (NITROLINGUAL) 0.4 MG/SPRAY spray Place 1 spray under the tongue every 5 (five) minutes as needed for chest pain.    . Omega-3 Fatty Acids (FISH OIL) 1200 MG CAPS Take 1 capsule by mouth daily.    Vladimir Faster Glycol-Propyl Glycol (SYSTANE OP) Apply 1 drop to eye as needed (for dry eyes).    . Probiotic Product (ALIGN) 4 MG CAPS Take 1 capsule by mouth daily.     No current facility-administered medications on file prior to visit.    Allergies  Allergen Reactions  . Bee Venom Anaphylaxis  . Contrast Media [Iodinated Diagnostic Agents] Hives  .  Lipitor [Atorvastatin] Swelling  . Ace Inhibitors Swelling  . Hydrocodone Other (See Comments)    Reaction:  Unknown   . Macrodantin Other (See Comments)    Reaction:  Chest pain   . Neosporin [Neomycin-Bacitracin Zn-Polymyx] Other (See Comments)    Reaction:  Burning   . Sulfa Antibiotics Hives  . Epinephrine Palpitations  . Prednisone Rash and Other (See Comments)    Reaction:  Dizziness and headache     Past Medical History  Diagnosis Date  . MVP (mitral valve prolapse)     and tricuspid leak  . AP (angina pectoris) ([place])   . Hiatal hernia   . Hypogammaglobulinemia ([place])     borderline  . Pelvic kidney     lower left side  . Sciatica   . Claustrophobia   . Diverticulosis of colon   . HLD (hyperlipidemia)   . Pernicious anemia   . HTN (hypertension)   . OA (osteoarthritis)   . TIA (transient ischemic attack) 8/16    Past Surgical History  Procedure Laterality Date  . Appendectomy    . Tonsillectomy    . Cholecystectomy    . Right ovary and tube corpus    . Dilation and curettage of uterus    . Breast lumpectomy      Right (fatty tumor)  . Inguinal hernia repair  Left side  . Excision vestibular glands    . Basal cell carcinoma removal      Back  . Intraocular lens insertion      Bilateral  . Film removed left eye    . Total abdominal hysterectomy    . Knee arthroscopy w/ partial medial meniscectomy  08/2009    right    Family History  Problem Relation Age of Onset  . Heart attack Father 14    CABG  . Dementia Mother   . Cancer Maternal Aunt     Breast-multiple aunts and Gm  . Cancer Maternal Uncle     Bladder and lung  . Diabetes Paternal Grandmother   . Hypertension Father   . Hypertension Brother     Social History   Social History  . Marital Status: Married    Spouse Name: N/A  . Number of Children: 2  . Years of Education: N/A   Occupational History  . Retired-bookkeeping/secretarial work     retired  .      Social History  Main Topics  . Smoking status: Never Smoker   . Smokeless tobacco: Never Used  . Alcohol Use: Yes     Comment: Regular wine a few nights a week  . Drug Use: No  . Sexual Activity: Not on file   Other Topics Concern  . Not on file   Social History Narrative   Has living will and advance directives.    Requests husband, then daughter to make health care decisions.   Would accept resuscitation but no life prolonging technology if cognitively unaware   Review of Systems  No back pain Mild suprapubic pain--intermittent No vomiting Chronically has bouts of diarrhea--no change Appetite is off some      Objective:   Physical Exam  Constitutional: She appears well-developed and well-nourished. No distress.  Abdominal: Soft. She exhibits no distension. There is no rebound and no guarding.  Mild suprapubic tenderness  Musculoskeletal:  No CVA tenderness          Assessment & Plan:

## 2016-05-21 NOTE — Patient Instructions (Signed)
Please start the antibiotic right away and take 2 doses today. If your symptoms are gone by tomorrow morning, you only need to take it for 3 days.

## 2016-05-21 NOTE — Assessment & Plan Note (Signed)
Seems to have acute cystitis No significant systemic symptoms

## 2016-05-21 NOTE — Progress Notes (Signed)
Pre visit review using our clinic review tool, if applicable. No additional management support is needed unless otherwise documented below in the visit note. 

## 2016-05-24 DIAGNOSIS — M9905 Segmental and somatic dysfunction of pelvic region: Secondary | ICD-10-CM | POA: Diagnosis not present

## 2016-05-24 DIAGNOSIS — M9903 Segmental and somatic dysfunction of lumbar region: Secondary | ICD-10-CM | POA: Diagnosis not present

## 2016-05-24 DIAGNOSIS — M62830 Muscle spasm of back: Secondary | ICD-10-CM | POA: Diagnosis not present

## 2016-05-24 DIAGNOSIS — M5136 Other intervertebral disc degeneration, lumbar region: Secondary | ICD-10-CM | POA: Diagnosis not present

## 2016-05-29 DIAGNOSIS — M9903 Segmental and somatic dysfunction of lumbar region: Secondary | ICD-10-CM | POA: Diagnosis not present

## 2016-05-29 DIAGNOSIS — M9905 Segmental and somatic dysfunction of pelvic region: Secondary | ICD-10-CM | POA: Diagnosis not present

## 2016-05-29 DIAGNOSIS — M62830 Muscle spasm of back: Secondary | ICD-10-CM | POA: Diagnosis not present

## 2016-05-29 DIAGNOSIS — M5136 Other intervertebral disc degeneration, lumbar region: Secondary | ICD-10-CM | POA: Diagnosis not present

## 2016-06-05 ENCOUNTER — Other Ambulatory Visit: Payer: Self-pay | Admitting: Internal Medicine

## 2016-06-05 DIAGNOSIS — M5136 Other intervertebral disc degeneration, lumbar region: Secondary | ICD-10-CM | POA: Diagnosis not present

## 2016-06-05 DIAGNOSIS — M9905 Segmental and somatic dysfunction of pelvic region: Secondary | ICD-10-CM | POA: Diagnosis not present

## 2016-06-05 DIAGNOSIS — M9903 Segmental and somatic dysfunction of lumbar region: Secondary | ICD-10-CM | POA: Diagnosis not present

## 2016-06-05 DIAGNOSIS — M62830 Muscle spasm of back: Secondary | ICD-10-CM | POA: Diagnosis not present

## 2016-06-11 ENCOUNTER — Telehealth: Payer: Self-pay | Admitting: Internal Medicine

## 2016-06-11 ENCOUNTER — Emergency Department
Admission: EM | Admit: 2016-06-11 | Discharge: 2016-06-11 | Disposition: A | Discharge status: Home / Self Care | Payer: Medicare Other | Attending: Emergency Medicine | Admitting: Emergency Medicine

## 2016-06-11 ENCOUNTER — Emergency Department: Payer: Medicare Other

## 2016-06-11 DIAGNOSIS — Z79899 Other long term (current) drug therapy: Secondary | ICD-10-CM | POA: Diagnosis not present

## 2016-06-11 DIAGNOSIS — I10 Essential (primary) hypertension: Secondary | ICD-10-CM | POA: Diagnosis not present

## 2016-06-11 DIAGNOSIS — I119 Hypertensive heart disease without heart failure: Secondary | ICD-10-CM | POA: Diagnosis not present

## 2016-06-11 DIAGNOSIS — R531 Weakness: Secondary | ICD-10-CM | POA: Diagnosis present

## 2016-06-11 DIAGNOSIS — R251 Tremor, unspecified: Secondary | ICD-10-CM

## 2016-06-11 DIAGNOSIS — W19XXXA Unspecified fall, initial encounter: Secondary | ICD-10-CM | POA: Diagnosis not present

## 2016-06-11 DIAGNOSIS — M1990 Unspecified osteoarthritis, unspecified site: Secondary | ICD-10-CM | POA: Insufficient documentation

## 2016-06-11 DIAGNOSIS — Y939 Activity, unspecified: Secondary | ICD-10-CM | POA: Insufficient documentation

## 2016-06-11 DIAGNOSIS — R200 Anesthesia of skin: Secondary | ICD-10-CM | POA: Diagnosis not present

## 2016-06-11 DIAGNOSIS — E785 Hyperlipidemia, unspecified: Secondary | ICD-10-CM | POA: Insufficient documentation

## 2016-06-11 DIAGNOSIS — Z792 Long term (current) use of antibiotics: Secondary | ICD-10-CM | POA: Diagnosis not present

## 2016-06-11 DIAGNOSIS — Z7982 Long term (current) use of aspirin: Secondary | ICD-10-CM | POA: Insufficient documentation

## 2016-06-11 DIAGNOSIS — Z8679 Personal history of other diseases of the circulatory system: Secondary | ICD-10-CM | POA: Insufficient documentation

## 2016-06-11 DIAGNOSIS — Y999 Unspecified external cause status: Secondary | ICD-10-CM | POA: Insufficient documentation

## 2016-06-11 DIAGNOSIS — Y929 Unspecified place or not applicable: Secondary | ICD-10-CM | POA: Diagnosis not present

## 2016-06-11 DIAGNOSIS — I6930 Unspecified sequelae of cerebral infarction: Secondary | ICD-10-CM | POA: Insufficient documentation

## 2016-06-11 LAB — CBC
HCT: 38.9 % (ref 35.0–47.0)
Hemoglobin: 13 g/dL (ref 12.0–16.0)
MCH: 30.9 pg (ref 26.0–34.0)
MCHC: 33.4 g/dL (ref 32.0–36.0)
MCV: 92.6 fL (ref 80.0–100.0)
PLATELETS: 219 10*3/uL (ref 150–440)
RBC: 4.21 MIL/uL (ref 3.80–5.20)
RDW: 14.3 % (ref 11.5–14.5)
WBC: 7 10*3/uL (ref 3.6–11.0)

## 2016-06-11 LAB — COMPREHENSIVE METABOLIC PANEL
ALT: 20 U/L (ref 14–54)
ANION GAP: 14 (ref 5–15)
AST: 33 U/L (ref 15–41)
Albumin: 4.6 g/dL (ref 3.5–5.0)
Alkaline Phosphatase: 49 U/L (ref 38–126)
BUN: 22 mg/dL — ABNORMAL HIGH (ref 6–20)
CALCIUM: 9.6 mg/dL (ref 8.9–10.3)
CHLORIDE: 101 mmol/L (ref 101–111)
CO2: 24 mmol/L (ref 22–32)
Creatinine, Ser: 0.86 mg/dL (ref 0.44–1.00)
GFR calc non Af Amer: >60 mL/min (ref >60)
Glucose, Bld: 93 mg/dL (ref 65–99)
POTASSIUM: 3.2 mmol/L — AB (ref 3.5–5.1)
SODIUM: 139 mmol/L (ref 135–145)
Total Bilirubin: 0.6 mg/dL (ref 0.3–1.2)
Total Protein: 7.6 g/dL (ref 6.5–8.1)

## 2016-06-11 LAB — URINALYSIS COMPLETE WITH MICROSCOPIC (ARMC ONLY)
BILIRUBIN URINE: NEGATIVE
Bacteria, UA: NONE SEEN
Glucose, UA: NEGATIVE mg/dL
Hgb urine dipstick: NEGATIVE
Leukocytes, UA: NEGATIVE
NITRITE: NEGATIVE
PH: 5 (ref 5.0–8.0)
PROTEIN: NEGATIVE mg/dL
Specific Gravity, Urine: 1.018 (ref 1.005–1.030)

## 2016-06-11 LAB — DIFFERENTIAL
BASOS PCT: 0 %
Basophils Absolute: 0 10*3/uL (ref 0–0.1)
EOS ABS: 0 10*3/uL (ref 0–0.7)
EOS PCT: 0 %
Lymphocytes Relative: 15 %
Lymphs Abs: 1 10*3/uL (ref 1.0–3.6)
MONO ABS: 0.8 10*3/uL (ref 0.2–0.9)
Monocytes Relative: 11 %
Neutro Abs: 5.1 10*3/uL (ref 1.4–6.5)
Neutrophils Relative %: 74 %

## 2016-06-11 LAB — PROTIME-INR
INR: 0.98
PROTHROMBIN TIME: 13.2 s (ref 11.4–15.0)

## 2016-06-11 LAB — TROPONIN I: Troponin I: <0.03 ng/mL (ref <0.031)

## 2016-06-11 NOTE — Discharge Instructions (Signed)
You have been seen in the Emergency Department (ED) today for a fall.  Your work up does not show any concerning injuries.  Please take over-the-counter ibuprofen and/or Tylenol as needed for your pain (unless you have an allergy or your doctor as told you not to take them), or take any prescribed medication as instructed.  As we discussed, we cannot be absolutely certain that you did not have a new stroke (CVA), but you prefer not to stay for an MRI at this time.  Given that your symptoms are no worse than normal, other than the tremulousness of your hand, we think that is appropriate, but please return immediately if your symptoms get worse.  Please follow up with your doctor regarding today's Emergency Department (ED) visit and your recent fall.    Return to the ED if you have any headache, confusion, slurred speech, weakness/numbness of any arm or leg, or any increased pain.   Tremor A tremor is trembling or shaking that you cannot control. Most tremors affect the hands or arms. Tremors can also affect the head, vocal cords, face, and other parts of the body. There are many types of tremors. Common types include:   Essential tremor. These usually occur in people over the age of 69. It may run in families and can happen in otherwise healthy people.   Resting tremor. These occur when the muscles are at rest, such as when your hands are resting in your lap. People with Parkinson disease often have resting tremors.   Postural tremor. These occur when you try to hold a pose, such as keeping your hands outstretched.   Kinetic tremor. These occur during purposeful movement, such as trying to touch a finger to your nose.   Task-specific tremor. These may occur when you perform tasks such as handwriting, speaking, or standing.   Psychogenic tremor. These dramatically lessen or disappear when you are distracted. They can happen in people of all ages.  Some types of tremors have no known cause.  Tremors can also be a symptom of nervous system problems (neurological disorders) that may occur with aging. Some tremors go away with treatment while others do not.  HOME CARE INSTRUCTIONS Watch your tremor for any changes. The following actions may help to lessen any discomfort you are feeling:  Take medicines only as directed by your health care provider.   Limit alcohol intake to no more than 1 drink per day for nonpregnant women and 2 drinks per day for men. One drink equals 12 oz of beer, 5 oz of wine, or 1 oz of hard liquor.  Do not use any tobacco products, including cigarettes, chewing tobacco, or electronic cigarettes. If you need help quitting, ask your health care provider.   Avoid extreme heat or cold.   Limit the amount of caffeine you consumeas directed by your health care provider.   Try to get 8 hours of sleep each night.  Find ways to manage your stress, such as meditation or yoga.  Keep all follow-up visits as directed by your health care provider. This is important. SEEK MEDICAL CARE IF:  You start having a tremor after starting a new medicine.  You have tremor with other symptoms such as:  Numbness.  Tingling.  Pain.  Weakness.  Your tremor gets worse.  Your tremor interferes with your day-to-day life.   This information is not intended to replace advice given to you by your health care provider. Make sure you discuss any questions you  have with your health care provider.   Document Released: 12/07/2002 Document Revised: 01/07/2015 Document Reviewed: 06/14/2014 Elsevier Interactive Patient Education [employer].

## 2016-06-11 NOTE — Telephone Encounter (Signed)
It looks like she went to the ED- notes pending

## 2016-06-11 NOTE — Telephone Encounter (Signed)
Spoke with Dr. Glori Bickers re: pt appointment from [facility] on schedule for 06/12/16, Dr. Glori Bickers recommends pt be seen at ER and not wait until tomorrow.  Conveyed to PCP's CMA, she is calling to get pt to ER/ lt

## 2016-06-11 NOTE — Telephone Encounter (Signed)
[facility]  Patient Name: Mallory Rodriguez  DOB: Mar 24, 1933    Initial Comment Mother fell though out the night, feeling weak and shaking    Nurse Assessment  Nurse: Wynetta Emery, RN, Baker Janus Date/Time Eilene Ghazi Time): 06/11/2016 10:10:47 AM  Confirm and document reason for call. If symptomatic, describe symptoms. You must click the next button to save text entered. ---Mallory Rodriguez normally shakes but it is worse now-- fell on the floor laid there for awhile -- then crawled to phone and called daughter to get her up off the floor was able to get up and go to the br and back to bed may have had a TIA  Has the patient traveled out of the country within the last 30 days? ---No  Does the patient have any new or worsening symptoms? ---Yes  Will a triage be completed? ---Yes  Related visit to physician within the last 2 weeks? ---No  Does the PT have any chronic conditions? (i.e. diabetes, asthma, etc.) ---Unknown  Is this a behavioral health or substance abuse call? ---No     Guidelines    Guideline Title Affirmed Question Affirmed Notes  Weakness (Generalized) and Fatigue [1] MODERATE weakness (i.e., interferes with work, school, normal activities) AND [2] cause unknown (Exceptions: weakness with acute minor illness, or weakness from poor fluid intake)    Final Disposition User   See Physician within 4 Hours (or PCP triage) Wynetta Emery, RN, Baker Janus    Comments  NO appts available for mother to see Dr. Silvio Pate today and tomorrow. needs to be seen today advised she should take her to ED at this time -- did want to make appt with another doctor tomorrow in case she won't go to ED. Dr. Loura Pardon 06-12-2016 900am   Referrals  REFERRED TO PCP OFFICE   Disagree/Comply: Comply

## 2016-06-11 NOTE — Telephone Encounter (Signed)
I spoke to the pt and advised her Dr Glori Bickers feels she needs to be seen in the ER. The pt said she is just weak after sleeping on the floor last night. I reiterated the fact that she needed to be seen in the ER with her symptoms. She says she does did not have a TIA or CVA. She is just weak. Her daughter is on the way to get her and they will decide where to go from there.

## 2016-06-11 NOTE — Telephone Encounter (Signed)
Please check on her later if she doesn't go to the ER

## 2016-06-11 NOTE — ED Notes (Signed)
Pt states last early in the evening pt states she got up from her chair and had a syncople episode and woke on the floor, states she has to crawl to the phone and was able to finally make it around 4am, states he daughter came and helped her to bed and the pt refused to come in then .Marland Kitchen States she is having increased weakness to the right side with shaking more then normal.. Pt has some paralysis to the right from previous stroke.Marland Kitchen Pt was ambulatory to triage with a cane without difficulty

## 2016-06-11 NOTE — ED Notes (Addendum)
Pt refusing to allow cardiac monitoring, IV plaecment, as well as additional blood collection. Pt states she just wants her results so that she can go at this time, MD made aware.

## 2016-06-11 NOTE — ED Provider Notes (Signed)
[facility] Emergency Department Provider Note  ____________________________________________  Time seen: Approximately 2:00 PM  I have reviewed the triage vital signs and the nursing notes.   HISTORY  Chief Complaint Cerebrovascular Accident    HPI Alanda Moralis is a 82 y.o. female with a history of a prior CVA resulting in some chronic right arm and right leg weakness and who ambulates with a cane.  She presents after having a fall early in the evening yesterday and spending at least a few hours on the ground until her daughter could help her up in the bed.  She slept the rest of the night without any difficulties but when she awoke today she initially felt like her weakness in her right arm and leg were worse than usual.  She also notes that she has a worse than usual tremor in her right hand although she does have a resting tremor at baseline.  Her daughter had to insist that she come to the emergency department and the patient states that she does not want to be here.  Before I can come see her, she was refusing cardiac monitoring, IV placement, additional blood collection, changing into a gown, and states that she just wants to know her CT scan results so that she can leave.  Whenevaluated her she is in no acute distress.  She is ambulatory around the room with no difficulty even without a cane.  She complains of no pain.  She states that she does not know why she fell and that she was able to crawl to a phone eventually.  She is having no pain in any of her extremities, no pain in her head, and no neck pain.  She does not have any chest pain or shortness of breath.  As described above, she states that her right hand is slightly more tremulous than usual, otherwise she states that "I am fine."  She states she is ready to go.   Past Medical History  Diagnosis Date  . MVP (mitral valve prolapse)     and tricuspid leak  . AP (angina pectoris) ([place])   . Hiatal  hernia   . Hypogammaglobulinemia ([place])     borderline  . Pelvic kidney     lower left side  . Sciatica   . Claustrophobia   . Diverticulosis of colon   . HLD (hyperlipidemia)   . Pernicious anemia   . HTN (hypertension)   . OA (osteoarthritis)   . TIA (transient ischemic attack) 8/16    Patient Active Problem List   Diagnosis Date Noted  . Dysuria 05/21/2016  . Viral upper respiratory infection 03/05/2016  . Contusion, eyelid, left 02/23/2016  . Advance directive discussed with patient 12/13/2015  . CVA (cerebral vascular accident) ([place]) 08/31/2015  . Hemiplegia of nondominant side, late effect of cerebrovascular disease ([place]) 08/17/2015  . Tremor 11/30/2014  . Allergic rhinitis due to pollen 05/18/2013  . Routine general medical examination at a health care facility 11/18/2012  . Osteoarthritis, multiple sites 11/07/2010  . Hyperlipemia 11/02/2010  . PERNICIOUS ANEMIA 11/02/2010  . Mood disorder ([place]) 02/09/2010  . HYPERTENSION, BENIGN 02/09/2010  . Mitral valve disorder 02/09/2010    Past Surgical History  Procedure Laterality Date  . Appendectomy    . Tonsillectomy    . Cholecystectomy    . Right ovary and tube corpus    . Dilation and curettage of uterus    . Breast lumpectomy      Right (fatty  tumor)  . Inguinal hernia repair      Left side  . Excision vestibular glands    . Basal cell carcinoma removal      Back  . Intraocular lens insertion      Bilateral  . Film removed left eye    . Total abdominal hysterectomy    . Knee arthroscopy w/ partial medial meniscectomy  08/2009    right    Current Outpatient Rx  Name  Route  Sig  Dispense  Refill  . acetaminophen (TYLENOL) 650 MG CR tablet   Oral   Take 650 mg by mouth 3 (three) times daily.         Marland Kitchen amoxicillin (AMOXIL) 500 MG capsule      TAKE 4 CAPSULES BY MOUTH 1 HOUR PRIOR TO DENTAL APPT      1   . aspirin 81 MG tablet   Oral   Take 81 mg by mouth daily.         Marland Kitchen CALCIUM-VITAMIN  D-VITAMIN K PO   Oral   Take 1 each by mouth daily.         . cefUROXime (CEFTIN) 250 MG tablet   Oral   Take 1 tablet (250 mg total) by mouth 2 (two) times daily with a meal.   14 tablet   0   . clopidogrel (PLAVIX) 75 MG tablet      TAKE 1 TABLET BY MOUTH EVERY DAY   30 tablet   11   . cyanocobalamin (,VITAMIN B-12,) 1000 MCG/ML injection   Intramuscular   Inject 1,000 mcg into the muscle every 3 (three) months.         . diphenhydrAMINE (BENADRYL) 12.5 MG/5ML liquid   Oral   Take 37.5 mg by mouth 4 (four) times daily as needed for allergies.          Marland Kitchen EPINEPHrine (EPIPEN JR) 0.15 MG/0.3ML injection      INJECT ONE SYRINGEFUL INTRAMUSCULARLY AS NEEDED FOR  ANAPHYLAXIS   2 each   0   . hydrochlorothiazide (HYDRODIURIL) 25 MG tablet      TAKE ONE TABLET BY MOUTH DAILY.   30 tablet   5   . lovastatin (MEVACOR) 40 MG tablet   Oral   Take 1 tablet (40 mg total) by mouth at bedtime.   30 tablet   11   . nitroGLYCERIN (NITROLINGUAL) 0.4 MG/SPRAY spray   Sublingual   Place 1 spray under the tongue every 5 (five) minutes as needed for chest pain.         . Omega-3 Fatty Acids (FISH OIL) 1200 MG CAPS   Oral   Take 1 capsule by mouth daily.         Vladimir Faster Glycol-Propyl Glycol (SYSTANE OP)   Ophthalmic   Apply 1 drop to eye as needed (for dry eyes).         . Probiotic Product (ALIGN) 4 MG CAPS   Oral   Take 1 capsule by mouth daily.           Allergies Bee venom; Contrast media; Lipitor; Ace inhibitors; Hydrocodone; Macrodantin; Neosporin; Sulfa antibiotics; Epinephrine; and Prednisone  Family History  Problem Relation Age of Onset  . Heart attack Father 2    CABG  . Dementia Mother   . Cancer Maternal Aunt     Breast-multiple aunts and Gm  . Cancer Maternal Uncle     Bladder and lung  . Diabetes Paternal Grandmother   .  Hypertension Father   . Hypertension Brother     Social History Social History  Substance Use Topics  .  Smoking status: Never Smoker   . Smokeless tobacco: Never Used  . Alcohol Use: Yes     Comment: Regular wine a few nights a week    Review of Systems Constitutional: No fever/chills Eyes: No visual changes. ENT: No sore throat. Cardiovascular: Denies chest pain. Respiratory: Denies shortness of breath. Gastrointestinal: No abdominal pain.  No nausea, no vomiting.  No diarrhea.  No constipation. Genitourinary: Negative for dysuria. Musculoskeletal: Negative for back pain. Skin: Negative for rash. Neurological: Fall for unknown reason yesterday.  Slightly worse than usual weakness in the right arm and right leg with some tremulousness that is worse than usual in the right arm.  10-point ROS otherwise negative.  ____________________________________________   PHYSICAL EXAM:  VITAL SIGNS: ED Triage Vitals  Enc Vitals Group     BP 06/11/16 1216 155/71 mmHg     Pulse Rate 06/11/16 1216 93     Resp 06/11/16 1216 17     Temp 06/11/16 1216 98.2 F (36.8 C)     Temp Source 06/11/16 1216 Oral     SpO2 06/11/16 1216 100 %     Weight 06/11/16 1216 152 lb (68.947 kg)     Height 06/11/16 1216 5\' 2"  (1.575 m)     Head Cir --      Peak Flow --      Pain Score --      Pain Loc --      Pain Edu? --      Excl. in [place]? --     Constitutional: Alert and oriented. Well appearing and in no acute distress. Eyes: Conjunctivae are normal. PERRL. EOMI. Head: Atraumatic. Nose: No congestion/rhinnorhea. Mouth/Throat: Mucous membranes are moist.  Oropharynx non-erythematous. Neck: No stridor.  No meningeal signs.  No cervical spine tenderness to palpation. Cardiovascular: Normal rate, regular rhythm. Good peripheral circulation. Grossly normal heart sounds.   Respiratory: Normal respiratory effort.  No retractions. Lungs CTAB. Gastrointestinal: Soft and nontender. No distention.  Musculoskeletal: No lower extremity tenderness nor edema. No gross deformities of extremities. Neurologic:  Normal  speech and language. No gross focal neurologic deficits are appreciated.  Very slight resting tremor in her right arm.  No dysmetria.  No difficulty with ambulation.  Muscle groups have normal strength throughout. Skin:  Skin is warm, dry and intact. No rash noted. Psychiatric: Mood and affect are normal. Speech and behavior are normal.  NIH Stroke Scale  Interval: Baseline Time: 2:00 PM Person Administering Scale: Jobeth Pangilinan  Administer stroke scale items in the order listed. Record performance in each category after each subscale exam. Do not go back and change scores. Follow directions provided for each exam technique. Scores should reflect what the patient does, not what the clinician thinks the patient can do. The clinician should record answers while administering the exam and work quickly. Except where indicated, the patient should not be coached (i.e., repeated requests to patient to make a special effort).   1a  Level of consciousness: 0=alert; keenly responsive  1b. LOC questions:  0=Performs both tasks correctly  1c. LOC commands: 0=Performs both tasks correctly  2.  Best Gaze: 0=normal  3.  Visual: 0=No visual loss  4. Facial Palsy: 0=Normal symmetric movement  5a.  Motor left arm: 0=No drift, limb holds 90 (or 45) degrees for full 10 seconds  5b.  Motor right arm: 0=No drift, limb holds  90 (or 45) degrees for full 10 seconds  6a. motor left leg: 0=No drift, limb holds 90 (or 45) degrees for full 10 seconds  6b  Motor right leg:  0=No drift, limb holds 90 (or 45) degrees for full 10 seconds  7. Limb Ataxia: 0=Absent  8.  Sensory: 0=Normal; no sensory loss  9. Best Language:  0=No aphasia, normal  10. Dysarthria: 0=Normal  11. Extinction and Inattention: 0=No abnormality  12. Distal motor function: 0=Normal   Total:   0     ____________________________________________   LABS (all labs ordered are listed, but only abnormal results are displayed)  Labs Reviewed    COMPREHENSIVE METABOLIC PANEL - Abnormal; Notable for the following:    Potassium 3.2 (*)    BUN 22 (*)    All other components within normal limits  URINALYSIS COMPLETEWITH MICROSCOPIC ([facility] ONLY) - Abnormal; Notable for the following:    Color, Urine YELLOW (*)    APPearance CLEAR (*)    Ketones, ur TRACE (*)    Squamous Epithelial / LPF 0-5 (*)    All other components within normal limits  PROTIME-INR  CBC  DIFFERENTIAL  TROPONIN I  CBG MONITORING, ED   ____________________________________________  EKG  ED ECG REPORT I, Cristianna Cyr, the attending physician, personally viewed and interpreted this ECG.  Date: 06/11/2016 EKG Time: 12:25 Rate: 92 Rhythm: normal sinus rhythm QRS Axis: Left axis deviation Intervals: normal ST/T Wave abnormalities: Non-specific ST segment / T-wave changes, but no evidence of acute ischemia. Conduction Disturbances: none Narrative Interpretation: unremarkable  ____________________________________________  RADIOLOGY   Ct Head Wo Contrast  06/11/2016  CLINICAL DATA:  Right upper extremity numbness for approximately 12 hours. Recent syncope with fall EXAM: CT HEAD WITHOUT CONTRAST TECHNIQUE: Contiguous axial images were obtained from the base of the skull through the vertex without intravenous contrast. COMPARISON:  August 01, 2015 FINDINGS: There is mild generalized atrophy. There is mild encephalomalacia with relative enlargement of the frontal horn of the left lateral ventricle compared to the right. There is no intracranial mass hemorrhage, extra-axial fluid collection, or midline shift. There is calcification throughout the head of the caudate nucleus and a portion of the anterior left lentiform nucleus, likely due to prior infarct, chronic and stable. There is also mild basal ganglia calcification which is physiologic. There is patchy small vessel disease in the centra semiovale bilaterally. No acute infarct is evident. The bony calvarium  appears intact. The mastoid air cells are clear. Visualized orbits are symmetric bilaterally. There is mucosal thickening in several ethmoid air cells bilaterally. IMPRESSION: Atrophy with periventricular small vessel disease. Prior infarct in the left medial basal ganglia with calcification and mild adjacent encephalomalacia of the frontal horn of the left lateral ventricle. No acute infarct evident. There are physiologic basal ganglia calcifications as well on both sides. No hemorrhage or mass effect. No extra-axial fluid collection. Areas of Mild paranasal sinus disease evident. Electronically Signed   By: Lowella Grip III M.D.   On: 06/11/2016 13:38    ____________________________________________   PROCEDURES  Procedure(s) performed: None  Critical Care performed: No ____________________________________________   INITIAL IMPRESSION / ASSESSMENT AND PLAN / ED COURSE  Pertinent labs & imaging results that were available during my care of the patient were reviewed by me and considered in my medical decision making (see chart for details).  In spite of her chronic weakness on the right side, she is able to perform all the tasks given to her and she has  an NIH stroke scale is 0.  I do not believe that her episode including her tremulousness reflects an acute CVA, but I explained to the patient and her family that I cannot absolutely rule it out based on the noncontrast CT scan alone.  The patient was walking out the door and I had to encourage her to at least let me check a urinalysis to make sure that a UTI is not exacerbating her chronic neurological symptoms.  However that was normal as well.  I offered an MRI to further evaluate the possibility of a CVA and she states that she feels fine and is ready to go home.  Her daughter understands the implications but agrees with the plan to go home and follow-up as an outpatient.  I gave my usual and customary return precautions.   I explained that  I do not have a particularly good reason for why she fell, particularly because the patient does not know the details behind it, but she is comfortable with this also.    ____________________________________________  FINAL CLINICAL IMPRESSION(S) / ED DIAGNOSES  Final diagnoses:  Fall, initial encounter  History of CVA with residual deficit  Tremulousness     MEDICATIONS GIVEN DURING THIS VISIT:  Medications - No data to display   NEW OUTPATIENT MEDICATIONS STARTED DURING THIS VISIT:  Discharge Medication List as of 06/11/2016  2:57 PM        Note:  This document was prepared using Dragon voice recognition software and may include unintentional dictation errors.   Hinda Kehr, MD 06/11/16 [id number]

## 2016-06-12 ENCOUNTER — Telehealth: Payer: Self-pay

## 2016-06-12 ENCOUNTER — Ambulatory Visit: Payer: Self-pay | Admitting: Family Medicine

## 2016-06-12 NOTE — Telephone Encounter (Signed)
Called patient to follow-up on her ER Visit from 06-11-16. She said she is feeling better. She did not want to schedule a follow-up visit. I advised her to call us if she changed her mind.

## 2016-06-14 ENCOUNTER — Ambulatory Visit (INDEPENDENT_AMBULATORY_CARE_PROVIDER_SITE_OTHER): Payer: Medicare Other

## 2016-06-14 DIAGNOSIS — E538 Deficiency of other specified B group vitamins: Secondary | ICD-10-CM | POA: Diagnosis not present

## 2016-06-14 MED ORDER — CYANOCOBALAMIN 1000 MCG/ML IJ SOLN
1000.0000 ug | Freq: Once | INTRAMUSCULAR | Status: AC
  Administered 2016-06-14: 1000 ug via INTRAMUSCULAR

## 2016-06-20 DIAGNOSIS — M62830 Muscle spasm of back: Secondary | ICD-10-CM | POA: Diagnosis not present

## 2016-06-20 DIAGNOSIS — M5136 Other intervertebral disc degeneration, lumbar region: Secondary | ICD-10-CM | POA: Diagnosis not present

## 2016-06-20 DIAGNOSIS — M9903 Segmental and somatic dysfunction of lumbar region: Secondary | ICD-10-CM | POA: Diagnosis not present

## 2016-06-20 DIAGNOSIS — M9905 Segmental and somatic dysfunction of pelvic region: Secondary | ICD-10-CM | POA: Diagnosis not present

## 2016-07-05 ENCOUNTER — Other Ambulatory Visit: Payer: Self-pay | Admitting: Internal Medicine

## 2016-07-09 DIAGNOSIS — M2041 Other hammer toe(s) (acquired), right foot: Secondary | ICD-10-CM | POA: Diagnosis not present

## 2016-07-09 DIAGNOSIS — M2011 Hallux valgus (acquired), right foot: Secondary | ICD-10-CM | POA: Diagnosis not present

## 2016-07-09 DIAGNOSIS — M898X9 Other specified disorders of bone, unspecified site: Secondary | ICD-10-CM | POA: Diagnosis not present

## 2016-07-10 DIAGNOSIS — M5136 Other intervertebral disc degeneration, lumbar region: Secondary | ICD-10-CM | POA: Diagnosis not present

## 2016-07-10 DIAGNOSIS — M9903 Segmental and somatic dysfunction of lumbar region: Secondary | ICD-10-CM | POA: Diagnosis not present

## 2016-07-10 DIAGNOSIS — M9905 Segmental and somatic dysfunction of pelvic region: Secondary | ICD-10-CM | POA: Diagnosis not present

## 2016-07-10 DIAGNOSIS — M62830 Muscle spasm of back: Secondary | ICD-10-CM | POA: Diagnosis not present

## 2016-07-31 ENCOUNTER — Other Ambulatory Visit: Payer: Self-pay | Admitting: Internal Medicine

## 2016-07-31 DIAGNOSIS — M62830 Muscle spasm of back: Secondary | ICD-10-CM | POA: Diagnosis not present

## 2016-07-31 DIAGNOSIS — M9905 Segmental and somatic dysfunction of pelvic region: Secondary | ICD-10-CM | POA: Diagnosis not present

## 2016-07-31 DIAGNOSIS — M9903 Segmental and somatic dysfunction of lumbar region: Secondary | ICD-10-CM | POA: Diagnosis not present

## 2016-07-31 DIAGNOSIS — M5136 Other intervertebral disc degeneration, lumbar region: Secondary | ICD-10-CM | POA: Diagnosis not present

## 2016-08-05 IMAGING — CT CT HEART SCORING
1 of 3 series · 10 of 20 positions shown, 13 images · non-contrast
Comparison: None.

CLINICAL DATA: Risk stratification

EXAM:
Coronary Calcium Score
TECHNIQUE: The patient was scanned on a Siemens Sensation 16 slice scanner.
Axial non-contrast 3 mm slices were carried out through the heart.
The data set was analyzed on a dedicated work station and scored
using the Agatson method.

[Series 6: st thins for reformat · axial · 0.62mm/px · z∈[-229,-119]mm · 10 of 136 slices shown, 13 images]
[im 13/136  vessel]
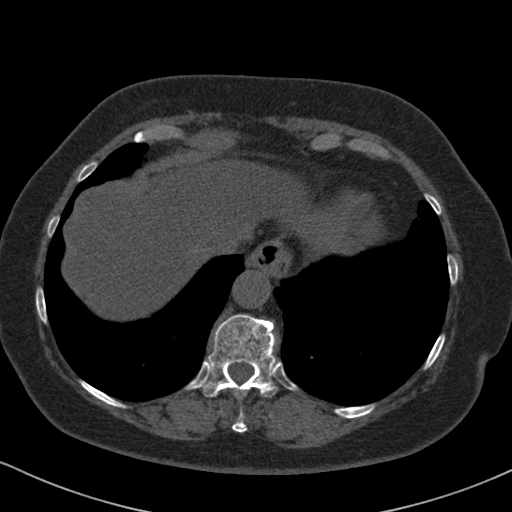
[im 13/136  lung]
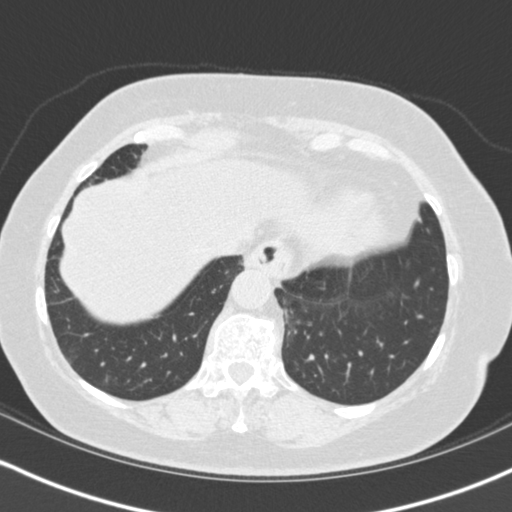
[im 25/136  vessel]
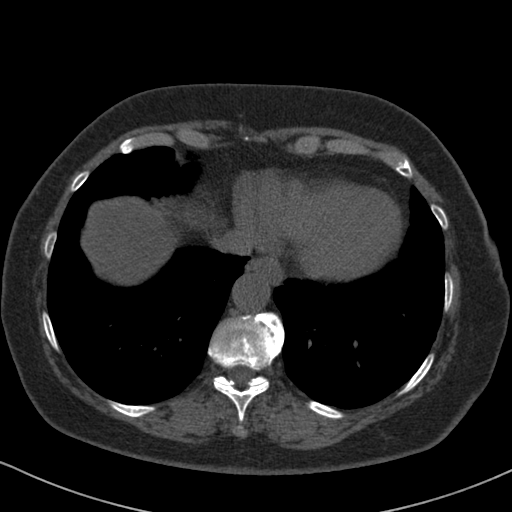
[im 37/136  vessel]
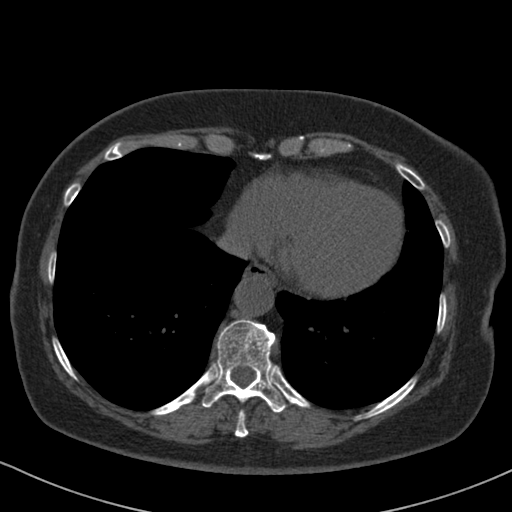
[im 50/136  vessel]
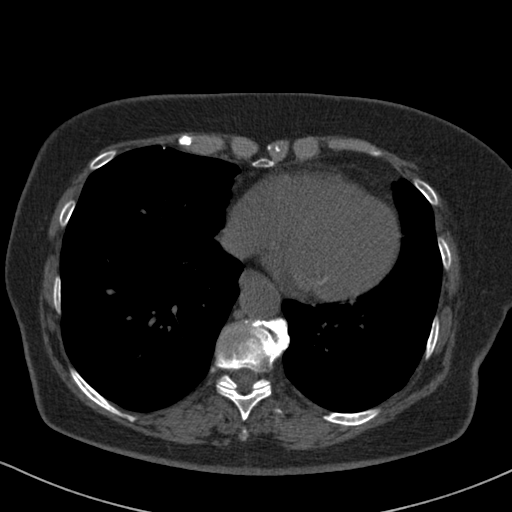
[im 62/136  vessel]
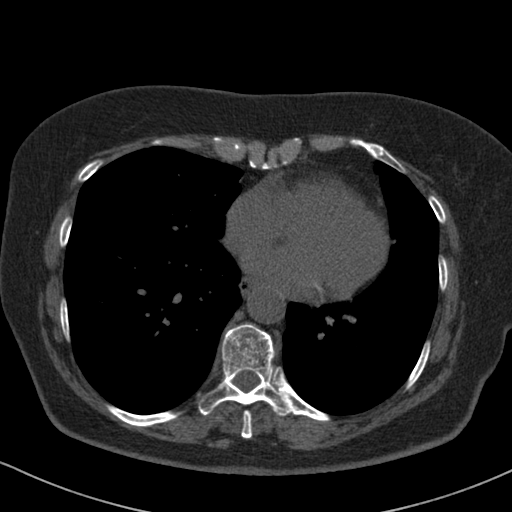
[im 62/136  lung]
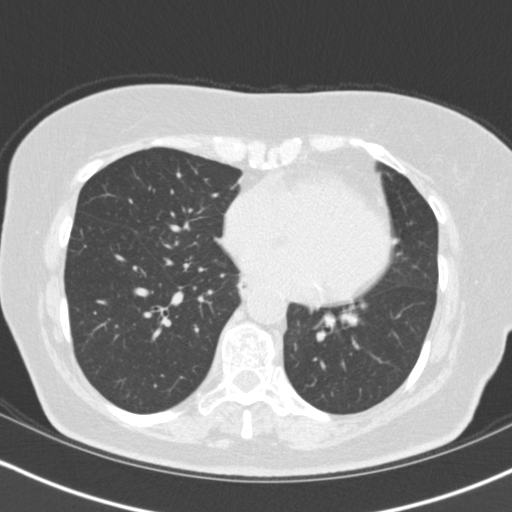
[im 74/136  vessel]
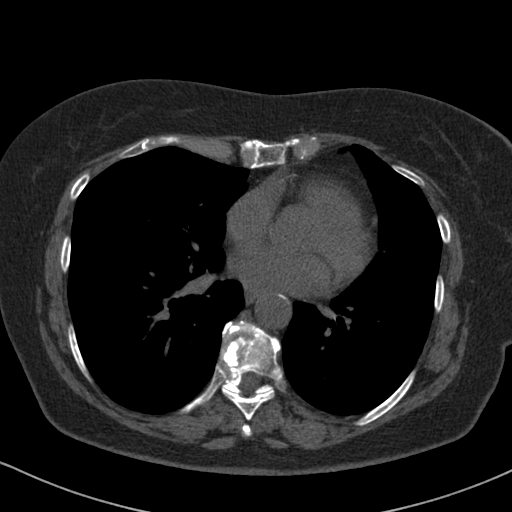
[im 86/136  vessel]
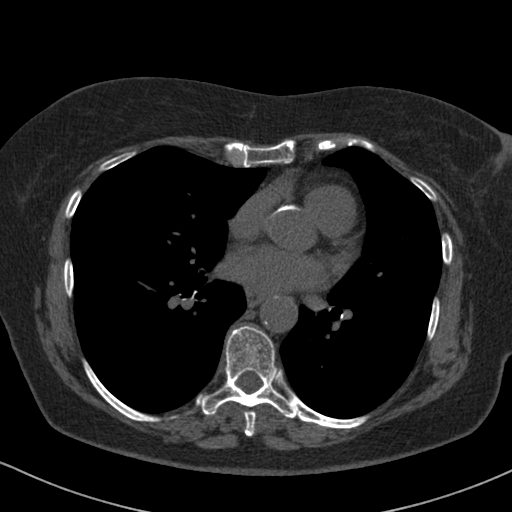
[im 99/136  vessel]
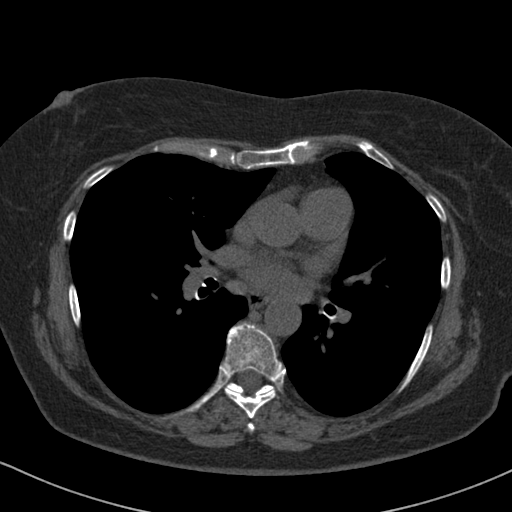
[im 111/136  vessel]
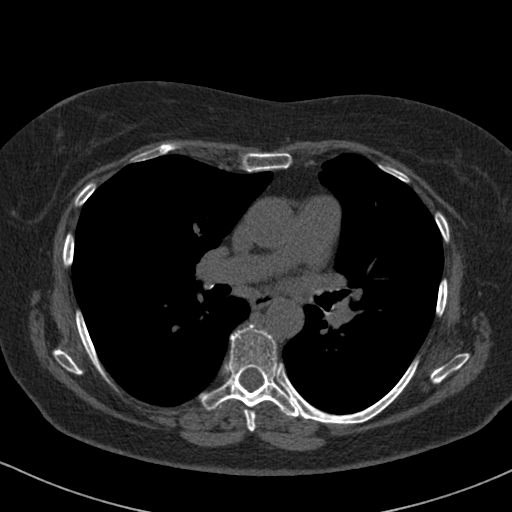
[im 111/136  lung]
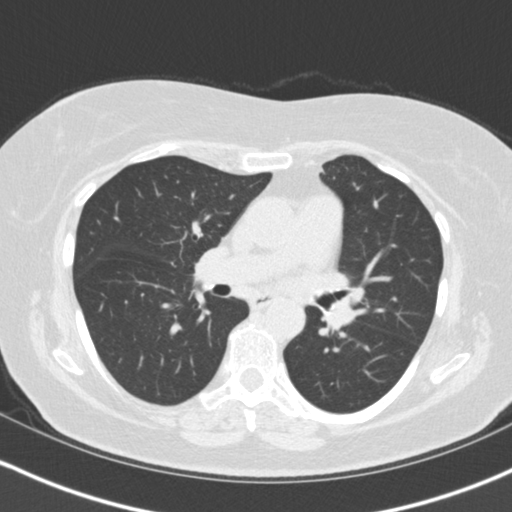
[im 123/136  vessel]
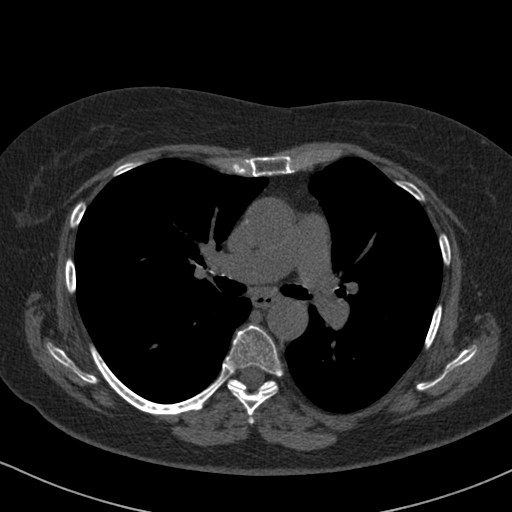

[10 of 20 positions shown; findings below may reference images not displayed]

FINDINGS: Non-cardiac: See separate report from [REDACTED].

Ascending Aorta: Normal caliber. Moderate calcifications in the
ascending aorta in the proximity to the origin of RCA.

Pericardium: Normal

Coronary arteries:  Originating in normal position.
IMPRESSION: Coronary calcium score of 1.3. This was 22 percentile for age and
sex matched control.

Erohilda Gerbier

EXAM:
OVER-READ INTERPRETATION  CT CHEST

The following report is an over-read performed by radiologist Dr.
does not include interpretation of cardiac or coronary anatomy or
pathology. The coronary calcium score interpretation by the
cardiologist is attached.
FINDINGS: The visualized portions of the lungs are clear. The visualized
portions of the mediastinum and chest wall show no significant
abnormality. A small hiatal hernia is seen as well as hepatic
steatosis.
IMPRESSION: Small hiatal hernia and hepatic steatosis noted. No other
significant noncardiac abnormality identified in the visualized
portion of the thorax.

## 2016-08-21 DIAGNOSIS — M9903 Segmental and somatic dysfunction of lumbar region: Secondary | ICD-10-CM | POA: Diagnosis not present

## 2016-08-21 DIAGNOSIS — M5136 Other intervertebral disc degeneration, lumbar region: Secondary | ICD-10-CM | POA: Diagnosis not present

## 2016-08-21 DIAGNOSIS — M62830 Muscle spasm of back: Secondary | ICD-10-CM | POA: Diagnosis not present

## 2016-08-21 DIAGNOSIS — M9905 Segmental and somatic dysfunction of pelvic region: Secondary | ICD-10-CM | POA: Diagnosis not present

## 2016-09-18 ENCOUNTER — Ambulatory Visit (INDEPENDENT_AMBULATORY_CARE_PROVIDER_SITE_OTHER): Payer: Medicare Other

## 2016-09-18 DIAGNOSIS — E538 Deficiency of other specified B group vitamins: Secondary | ICD-10-CM | POA: Diagnosis not present

## 2016-09-18 DIAGNOSIS — Z23 Encounter for immunization: Secondary | ICD-10-CM | POA: Diagnosis not present

## 2016-09-18 MED ORDER — CYANOCOBALAMIN 1000 MCG/ML IJ SOLN
1000.0000 ug | Freq: Once | INTRAMUSCULAR | Status: AC
  Administered 2016-09-18: 1000 ug via INTRAMUSCULAR

## 2016-09-19 DIAGNOSIS — M62830 Muscle spasm of back: Secondary | ICD-10-CM | POA: Diagnosis not present

## 2016-09-19 DIAGNOSIS — M9905 Segmental and somatic dysfunction of pelvic region: Secondary | ICD-10-CM | POA: Diagnosis not present

## 2016-09-19 DIAGNOSIS — M9903 Segmental and somatic dysfunction of lumbar region: Secondary | ICD-10-CM | POA: Diagnosis not present

## 2016-09-19 DIAGNOSIS — M5136 Other intervertebral disc degeneration, lumbar region: Secondary | ICD-10-CM | POA: Diagnosis not present

## 2016-09-27 ENCOUNTER — Other Ambulatory Visit: Payer: Self-pay | Admitting: Internal Medicine

## 2016-09-27 DIAGNOSIS — Z1231 Encounter for screening mammogram for malignant neoplasm of breast: Secondary | ICD-10-CM

## 2016-10-11 ENCOUNTER — Other Ambulatory Visit: Payer: Self-pay | Admitting: Internal Medicine

## 2016-10-17 DIAGNOSIS — M5136 Other intervertebral disc degeneration, lumbar region: Secondary | ICD-10-CM | POA: Diagnosis not present

## 2016-10-17 DIAGNOSIS — M9903 Segmental and somatic dysfunction of lumbar region: Secondary | ICD-10-CM | POA: Diagnosis not present

## 2016-10-17 DIAGNOSIS — M9905 Segmental and somatic dysfunction of pelvic region: Secondary | ICD-10-CM | POA: Diagnosis not present

## 2016-10-17 DIAGNOSIS — M62830 Muscle spasm of back: Secondary | ICD-10-CM | POA: Diagnosis not present

## 2016-10-19 ENCOUNTER — Other Ambulatory Visit: Payer: Self-pay | Admitting: Internal Medicine

## 2016-10-19 ENCOUNTER — Ambulatory Visit
Admission: RE | Admit: 2016-10-19 | Discharge: 2016-10-19 | Disposition: A | Discharge status: Home / Self Care | Payer: Medicare Other | Source: Ambulatory Visit | Attending: Internal Medicine | Admitting: Internal Medicine

## 2016-10-19 DIAGNOSIS — Z1231 Encounter for screening mammogram for malignant neoplasm of breast: Secondary | ICD-10-CM

## 2016-10-26 DIAGNOSIS — Z1283 Encounter for screening for malignant neoplasm of skin: Secondary | ICD-10-CM | POA: Diagnosis not present

## 2016-10-26 DIAGNOSIS — L814 Other melanin hyperpigmentation: Secondary | ICD-10-CM | POA: Diagnosis not present

## 2016-10-26 DIAGNOSIS — L578 Other skin changes due to chronic exposure to nonionizing radiation: Secondary | ICD-10-CM | POA: Diagnosis not present

## 2016-10-26 DIAGNOSIS — L720 Epidermal cyst: Secondary | ICD-10-CM | POA: Diagnosis not present

## 2016-10-26 DIAGNOSIS — L570 Actinic keratosis: Secondary | ICD-10-CM | POA: Diagnosis not present

## 2016-10-26 DIAGNOSIS — D692 Other nonthrombocytopenic purpura: Secondary | ICD-10-CM | POA: Diagnosis not present

## 2016-10-26 DIAGNOSIS — L821 Other seborrheic keratosis: Secondary | ICD-10-CM | POA: Diagnosis not present

## 2016-10-26 DIAGNOSIS — D1800 Hemangioma unspecified site: Secondary | ICD-10-CM | POA: Diagnosis not present

## 2016-10-31 ENCOUNTER — Other Ambulatory Visit: Payer: Self-pay | Admitting: Internal Medicine

## 2016-11-14 DIAGNOSIS — M5136 Other intervertebral disc degeneration, lumbar region: Secondary | ICD-10-CM | POA: Diagnosis not present

## 2016-11-14 DIAGNOSIS — M9903 Segmental and somatic dysfunction of lumbar region: Secondary | ICD-10-CM | POA: Diagnosis not present

## 2016-11-14 DIAGNOSIS — M62830 Muscle spasm of back: Secondary | ICD-10-CM | POA: Diagnosis not present

## 2016-11-14 DIAGNOSIS — M9905 Segmental and somatic dysfunction of pelvic region: Secondary | ICD-10-CM | POA: Diagnosis not present

## 2016-11-16 DIAGNOSIS — Z961 Presence of intraocular lens: Secondary | ICD-10-CM | POA: Diagnosis not present

## 2016-12-04 ENCOUNTER — Other Ambulatory Visit: Payer: Self-pay | Admitting: Internal Medicine

## 2016-12-07 ENCOUNTER — Other Ambulatory Visit: Payer: Self-pay

## 2016-12-12 DIAGNOSIS — M9905 Segmental and somatic dysfunction of pelvic region: Secondary | ICD-10-CM | POA: Diagnosis not present

## 2016-12-12 DIAGNOSIS — M9903 Segmental and somatic dysfunction of lumbar region: Secondary | ICD-10-CM | POA: Diagnosis not present

## 2016-12-12 DIAGNOSIS — M62830 Muscle spasm of back: Secondary | ICD-10-CM | POA: Diagnosis not present

## 2016-12-12 DIAGNOSIS — M5136 Other intervertebral disc degeneration, lumbar region: Secondary | ICD-10-CM | POA: Diagnosis not present

## 2016-12-18 ENCOUNTER — Ambulatory Visit (INDEPENDENT_AMBULATORY_CARE_PROVIDER_SITE_OTHER): Payer: Medicare Other | Admitting: Internal Medicine

## 2016-12-18 ENCOUNTER — Encounter: Payer: Self-pay | Admitting: Internal Medicine

## 2016-12-18 VITALS — BP 122/88 | HR 55 | Temp 98.4°F | Ht 61.5 in | Wt 160.0 lb

## 2016-12-18 DIAGNOSIS — I10 Essential (primary) hypertension: Secondary | ICD-10-CM | POA: Diagnosis not present

## 2016-12-18 DIAGNOSIS — I6300 Cerebral infarction due to thrombosis of unspecified precerebral artery: Secondary | ICD-10-CM | POA: Diagnosis not present

## 2016-12-18 DIAGNOSIS — I69959 Hemiplegia and hemiparesis following unspecified cerebrovascular disease affecting unspecified side: Secondary | ICD-10-CM

## 2016-12-18 DIAGNOSIS — F39 Unspecified mood [affective] disorder: Secondary | ICD-10-CM

## 2016-12-18 DIAGNOSIS — Z7189 Other specified counseling: Secondary | ICD-10-CM | POA: Diagnosis not present

## 2016-12-18 DIAGNOSIS — Z0000 Encounter for general adult medical examination without abnormal findings: Secondary | ICD-10-CM

## 2016-12-18 LAB — COMPREHENSIVE METABOLIC PANEL
ALT: 13 U/L (ref 0–35)
AST: 18 U/L (ref 0–37)
Albumin: 4.4 g/dL (ref 3.5–5.2)
Alkaline Phosphatase: 43 U/L (ref 39–117)
BUN: 19 mg/dL (ref 6–23)
CALCIUM: 9.7 mg/dL (ref 8.4–10.5)
CHLORIDE: 101 meq/L (ref 96–112)
CO2: 28 meq/L (ref 19–32)
CREATININE: 0.88 mg/dL (ref 0.40–1.20)
GFR: 65.54 mL/min (ref >60.00)
Glucose, Bld: 91 mg/dL (ref 70–99)
POTASSIUM: 4.1 meq/L (ref 3.5–5.1)
SODIUM: 140 meq/L (ref 135–145)
Total Bilirubin: 0.5 mg/dL (ref 0.2–1.2)
Total Protein: 7.2 g/dL (ref 6.0–8.3)

## 2016-12-18 LAB — CBC WITH DIFFERENTIAL/PLATELET
BASOS PCT: 0.2 % (ref 0.0–3.0)
Basophils Absolute: 0 10*3/uL (ref 0.0–0.1)
EOS ABS: 0.2 10*3/uL (ref 0.0–0.7)
EOS PCT: 2.5 % (ref 0.0–5.0)
HEMATOCRIT: 40.3 % (ref 36.0–46.0)
Hemoglobin: 13.5 g/dL (ref 12.0–15.0)
LYMPHS PCT: 19.1 % (ref 12.0–46.0)
Lymphs Abs: 1.2 10*3/uL (ref 0.7–4.0)
MCHC: 33.5 g/dL (ref 30.0–36.0)
MCV: 93.2 fl (ref 78.0–100.0)
Monocytes Absolute: 0.7 10*3/uL (ref 0.1–1.0)
Monocytes Relative: 10.7 % (ref 3.0–12.0)
NEUTROS ABS: 4.1 10*3/uL (ref 1.4–7.7)
Neutrophils Relative %: 67.5 % (ref 43.0–77.0)
PLATELETS: 218 10*3/uL (ref 150.0–400.0)
RBC: 4.32 Mil/uL (ref 3.87–5.11)
RDW: 14.2 % (ref 11.5–15.5)
WBC: 6.1 10*3/uL (ref 4.0–10.5)

## 2016-12-18 LAB — LIPID PANEL
CHOL/HDL RATIO: 2
Cholesterol: 198 mg/dL (ref 0–200)
HDL: 96.9 mg/dL (ref >39.00)
LDL CALC: 82 mg/dL (ref 0–99)
NONHDL: 101
TRIGLYCERIDES: 97 mg/dL (ref 0.0–149.0)
VLDL: 19.4 mg/dL (ref 0.0–40.0)

## 2016-12-18 LAB — T4, FREE: FREE T4: 0.79 ng/dL (ref 0.60–1.60)

## 2016-12-18 MED ORDER — LOVASTATIN 40 MG PO TABS: 40.0000 mg | ORAL_TABLET | Freq: Every day | ORAL | 3 refills | Status: DC

## 2016-12-18 NOTE — Patient Instructions (Signed)
DASH Eating Plan DASH stands for "Dietary Approaches to Stop Hypertension." The DASH eating plan is a healthy eating plan that has been shown to reduce high blood pressure (hypertension). Additional health benefits may include reducing the risk of type 2 diabetes mellitus, heart disease, and stroke. The DASH eating plan may also help with weight loss. What do I need to know about the DASH eating plan? For the DASH eating plan, you will follow these general guidelines:  Choose foods with less than 150 milligrams of sodium per serving (as listed on the food label).  Use salt-free seasonings or herbs instead of table salt or sea salt.  Check with your health care provider or pharmacist before using salt substitutes.  Eat lower-sodium products. These are often labeled as "low-sodium" or "no salt added."  Eat fresh foods. Avoid eating a lot of canned foods.  Eat more vegetables, fruits, and low-fat dairy products.  Choose whole grains. Look for the word "whole" as the first word in the ingredient list.  Choose fish and skinless chicken or turkey more often than red meat. Limit fish, poultry, and meat to 6 oz (170 g) each day.  Limit sweets, desserts, sugars, and sugary drinks.  Choose heart-healthy fats.  Eat more home-cooked food and less restaurant, buffet, and fast food.  Limit fried foods.  Do not fry foods. Cook foods using methods such as baking, boiling, grilling, and broiling instead.  When eating at a restaurant, ask that your food be prepared with less salt, or no salt if possible. What foods can I eat? Seek help from a dietitian for individual calorie needs. Grains  Whole grain or whole wheat bread. Brown rice. Whole grain or whole wheat pasta. Quinoa, bulgur, and whole grain cereals. Low-sodium cereals. Corn or whole wheat flour tortillas. Whole grain cornbread. Whole grain crackers. Low-sodium crackers. Vegetables  Fresh or frozen vegetables (raw, steamed, roasted, or  grilled). Low-sodium or reduced-sodium tomato and vegetable juices. Low-sodium or reduced-sodium tomato sauce and paste. Low-sodium or reduced-sodium canned vegetables. Fruits  All fresh, canned (in natural juice), or frozen fruits. Meat and Other Protein Products  Ground beef (85% or leaner), grass-fed beef, or beef trimmed of fat. Skinless chicken or turkey. Ground chicken or turkey. Pork trimmed of fat. All fish and seafood. Eggs. Dried beans, peas, or lentils. Unsalted nuts and seeds. Unsalted canned beans. Dairy  Low-fat dairy products, such as skim or 1% milk, 2% or reduced-fat cheeses, low-fat ricotta or cottage cheese, or plain low-fat yogurt. Low-sodium or reduced-sodium cheeses. Fats and Oils  Tub margarines without trans fats. Light or reduced-fat mayonnaise and salad dressings (reduced sodium). Avocado. Safflower, olive, or canola oils. Natural peanut or almond butter. Other  Unsalted popcorn and pretzels. The items listed above may not be a complete list of recommended foods or beverages. Contact your dietitian for more options.  What foods are not recommended? Grains  White bread. White pasta. White rice. Refined cornbread. Bagels and croissants. Crackers that contain trans fat. Vegetables  Creamed or fried vegetables. Vegetables in a cheese sauce. Regular canned vegetables. Regular canned tomato sauce and paste. Regular tomato and vegetable juices. Fruits  Canned fruit in light or heavy syrup. Fruit juice. Meat and Other Protein Products  Fatty cuts of meat. Ribs, chicken wings, bacon, sausage, bologna, salami, chitterlings, fatback, hot dogs, bratwurst, and packaged luncheon meats. Salted nuts and seeds. Canned beans with salt. Dairy  Whole or 2% milk, cream, half-and-half, and cream cheese. Whole-fat or sweetened yogurt. Full-fat cheeses   or blue cheese. Nondairy creamers and whipped toppings. Processed cheese, cheese spreads, or cheese curds. Condiments  Onion and garlic  salt, seasoned salt, table salt, and sea salt. Canned and packaged gravies. Worcestershire sauce. Tartar sauce. Barbecue sauce. Teriyaki sauce. Soy sauce, including reduced sodium. Steak sauce. Fish sauce. Oyster sauce. Cocktail sauce. Horseradish. Ketchup and mustard. Meat flavorings and tenderizers. Bouillon cubes. Hot sauce. Tabasco sauce. Marinades. Taco seasonings. Relishes. Fats and Oils  Butter, stick margarine, lard, shortening, ghee, and bacon fat. Coconut, palm kernel, or palm oils. Regular salad dressings. Other  Pickles and olives. Salted popcorn and pretzels. The items listed above may not be a complete list of foods and beverages to avoid. Contact your dietitian for more information.  Where can I find more information? National Heart, Lung, and Blood Institute: www.nhlbi.nih.gov/health/health-topics/topics/dash/ This information is not intended to replace advice given to you by your health care provider. Make sure you discuss any questions you have with your health care provider. Document Released: 12/06/2011 Document Revised: 05/24/2016 Document Reviewed: 10/21/2013 Elsevier Interactive Patient Education  2017 [employer]vier Inc.  

## 2016-12-18 NOTE — Assessment & Plan Note (Signed)
I have personally reviewed the Medicare Annual Wellness questionnaire and have noted 1. The patient's medical and social history 2. Their use of alcohol, tobacco or illicit drugs 3. Their current medications and supplements 4. The patient's functional ability including ADL's, fall risks, home safety risks and hearing or visual             impairment. 5. Diet and physical activities 6. Evidence for depression or mood disorders  The patients weight, height, BMI and visual acuity have been recorded in the chart I have made referrals, counseling and provided education to the patient based review of the above and I have provided the pt with a written personalized care plan for preventive services.  I have provided you with a copy of your personalized plan for preventive services. Please take the time to review along with your updated medication list.  UTD on immunizations No cancer screening due to age DASH info given Urged her to start exercise--esp resistance for strength

## 2016-12-18 NOTE — Assessment & Plan Note (Signed)
Better now Mostly after stroke Mild annoyances with husband only

## 2016-12-18 NOTE — Progress Notes (Signed)
Pre visit review using our clinic review tool, if applicable. No additional management support is needed unless otherwise documented below in the visit note. 

## 2016-12-18 NOTE — Assessment & Plan Note (Signed)
Copy of Health Care POA given to me today

## 2016-12-18 NOTE — Assessment & Plan Note (Signed)
Stable on DAPT On statin also

## 2016-12-18 NOTE — Progress Notes (Signed)
Subjective:    Patient ID: Mallory Rodriguez, female    DOB: [birth year], 82 y.o.   MRN: [id number]  HPI Here for Medicare wellness and follow up of chronic health conditions Reviewed form and advanced directives Reviewed other doctors--she gives a separate listing of all these  No new concerns Still has mild residual right side weakness Does affect her on stairs--manages with holding on Doesn't do much of the housework--has cleaning lady every 2 weeks Still does cooking and laundry, etc No tobacco Occasional wine--up to 1-2 glasses, but not every night Still doesn't exercise--not ready to start Did have 1 fall-- no sig injury but had ER visit (fell out of chair--?fell asleep) Does her instrumental ADLs Mild stable memory issues--no functional problems  Some stress with husband He watches TV very loud--they watch separately and sleep apart (not new) No depression or anhedonia  Not really anxious Mostly has adjusted since the stroke--mood is better  No recent chest pains Has the NTG--- has needed rarely at most Breathing is okay No dizziness or syncope Chronic mild ankle edema  Current Outpatient Prescriptions on File Prior to Visit  Medication Sig Dispense Refill  . acetaminophen (TYLENOL) 650 MG CR tablet Take 650 mg by mouth 3 (three) times daily.    Marland Kitchen amoxicillin (AMOXIL) 500 MG capsule TAKE 4 CAPSULES BY MOUTH 1 HOUR PRIOR TO DENTAL APPT  1  . aspirin 81 MG tablet Take 81 mg by mouth daily.    Marland Kitchen CALCIUM-VITAMIN D-VITAMIN K PO Take 1 each by mouth daily.    . clopidogrel (PLAVIX) 75 MG tablet TAKE 1 TABLET BY MOUTH EVERY DAY 30 tablet 2  . cyanocobalamin (,VITAMIN B-12,) 1000 MCG/ML injection Inject 1,000 mcg into the muscle every 3 (three) months.    . EPINEPHrine (EPIPEN JR) 0.15 MG/0.3ML injection INJECT ONE SYRINGEFUL INTRAMUSCULARLY AS NEEDED FOR  ANAPHYLAXIS 2 each 0  . hydrochlorothiazide (HYDRODIURIL) 25 MG tablet TAKE ONE TABLET BY MOUTH DAILY. 30 tablet 5  .  lovastatin (MEVACOR) 40 MG tablet TAKE 1 TABLET (40 MG TOTAL) BY MOUTH AT BEDTIME. 90 tablet 0  . nitroGLYCERIN (NITROLINGUAL) 0.4 MG/SPRAY spray PLACE ONE SPRAY UNDER THE TONGUE EVERY FIVE MINUTES AS NEEDED FOR CHEST PAIN 5 g 0  . Omega-3 Fatty Acids (FISH OIL) 1200 MG CAPS Take 1 capsule by mouth daily.    Vladimir Faster Glycol-Propyl Glycol (SYSTANE OP) Apply 1 drop to eye as needed (for dry eyes).    . Probiotic Product (ALIGN) 4 MG CAPS Take 1 capsule by mouth daily.     No current facility-administered medications on file prior to visit.     Allergies  Allergen Reactions  . Bee Venom Anaphylaxis  . Contrast Media [Iodinated Diagnostic Agents] Hives  . Lipitor [Atorvastatin] Swelling  . Ace Inhibitors Swelling  . Hydrocodone Other (See Comments)    Reaction:  Unknown   . Macrodantin Other (See Comments)    Reaction:  Chest pain   . Neosporin [Neomycin-Bacitracin Zn-Polymyx] Other (See Comments)    Reaction:  Burning   . Sulfa Antibiotics Hives  . Epinephrine Palpitations  . Prednisone Rash and Other (See Comments)    Reaction:  Dizziness and headache     Past Medical History:  Diagnosis Date  . AP (angina pectoris) ([place])   . Claustrophobia   . Diverticulosis of colon   . Hiatal hernia   . HLD (hyperlipidemia)   . HTN (hypertension)   . Hypogammaglobulinemia ([place])    borderline  . MVP (mitral  valve prolapse)    and tricuspid leak  . OA (osteoarthritis)   . Pelvic kidney    lower left side  . Pernicious anemia   . Sciatica   . TIA (transient ischemic attack) 8/16    Past Surgical History:  Procedure Laterality Date  . APPENDECTOMY    . basal cell carcinoma removal     Back  . BREAST BIOPSY Right    neg  . BREAST LUMPECTOMY     Right (fatty tumor)  . CHOLECYSTECTOMY    . DILATION AND CURETTAGE OF UTERUS    . excision vestibular glands    . Film removed Left eye    . INGUINAL HERNIA REPAIR     Left side  . INTRAOCULAR LENS INSERTION     Bilateral  . KNEE  ARTHROSCOPY W/ PARTIAL MEDIAL MENISCECTOMY  08/2009   right  . Right ovary and tube corpus    . TONSILLECTOMY    . TOTAL ABDOMINAL HYSTERECTOMY      Family History  Problem Relation Age of Onset  . Heart attack Father 3    CABG  . Hypertension Father   . Dementia Mother   . Cancer Maternal Aunt     Breast-multiple aunts and Gm  . Cancer Maternal Uncle     Bladder and lung  . Diabetes Paternal Grandmother   . Hypertension Brother     Social History   Social History  . Marital status: Married    Spouse name: N/A  . Number of children: 2  . Years of education: N/A   Occupational History  . Retired-bookkeeping/secretarial work     retired  .      Social History Main Topics  . Smoking status: Never Smoker  . Smokeless tobacco: Never Used  . Alcohol use Yes     Comment: Regular wine a few nights a week  . Drug use: No  . Sexual activity: Not on file   Other Topics Concern  . Not on file   Social History Narrative   Has living will and advance directives.    Requests husband, then daughter to make health care decisions.   Would accept resuscitation but no life prolonging technology if cognitively unaware   Review of Systems Mild allergy symptoms--- no longer taking the benedryl Appetite is good--craving for chocolate Weight up a little again Sleeps well Wears seat belt Teeth are okay--keeps up with dentist Bowels are better with probiotic. No blood in stool Voids fine. Mild urgency at night---no pad needed though Mild low back pain in AM--better with movement No rash or suspicious lesions    Objective:   Physical Exam  Constitutional: She is oriented to person, place, and time. She appears well-developed and well-nourished. No distress.  HENT:  Mouth/Throat: Oropharynx is clear and moist. No oropharyngeal exudate.  Neck: Normal range of motion. Neck supple. No thyromegaly present.  Cardiovascular: Normal rate, regular rhythm, normal heart sounds and intact  distal pulses.  Exam reveals no gallop.   No murmur heard. freq extra beats  Pulmonary/Chest: Effort normal and breath sounds normal. No respiratory distress. She has no wheezes. She has no rales.  Abdominal: Soft. There is no tenderness.  Musculoskeletal: She exhibits no edema or tenderness.  Lymphadenopathy:    She has no cervical adenopathy.  Neurological: She is alert and oriented to person, place, and time.  President-- "Trump, Obama, Bush" 100- "I can't do that" D-l-r-o-w Recall 3/3  Stable mild right side weakness  Skin: No  rash noted. No erythema.  Psychiatric: She has a normal mood and affect. Her behavior is normal.          Assessment & Plan:

## 2016-12-18 NOTE — Assessment & Plan Note (Signed)
BP Readings from Last 3 Encounters:  12/18/16 122/88  06/11/16 140/74  05/21/16 (!) 136/94   Good control now

## 2016-12-20 ENCOUNTER — Ambulatory Visit: Payer: Medicare Other

## 2016-12-20 ENCOUNTER — Ambulatory Visit (INDEPENDENT_AMBULATORY_CARE_PROVIDER_SITE_OTHER): Payer: Medicare Other

## 2016-12-20 DIAGNOSIS — E538 Deficiency of other specified B group vitamins: Secondary | ICD-10-CM

## 2016-12-20 MED ORDER — CYANOCOBALAMIN 1000 MCG/ML IJ SOLN
1000.0000 ug | Freq: Once | INTRAMUSCULAR | Status: AC
  Administered 2016-12-20: 1000 ug via INTRAMUSCULAR

## 2017-01-08 DIAGNOSIS — M5136 Other intervertebral disc degeneration, lumbar region: Secondary | ICD-10-CM | POA: Diagnosis not present

## 2017-01-08 DIAGNOSIS — M9905 Segmental and somatic dysfunction of pelvic region: Secondary | ICD-10-CM | POA: Diagnosis not present

## 2017-01-08 DIAGNOSIS — M62830 Muscle spasm of back: Secondary | ICD-10-CM | POA: Diagnosis not present

## 2017-01-08 DIAGNOSIS — M9903 Segmental and somatic dysfunction of lumbar region: Secondary | ICD-10-CM | POA: Diagnosis not present

## 2017-01-13 ENCOUNTER — Inpatient Hospital Stay (HOSPITAL_COMMUNITY)
Admission: EM | Admit: 2017-01-13 | Discharge: 2017-01-20 | DRG: 481 | Disposition: A | Discharge status: Skilled Nursing Facility | Payer: Medicare Other | Attending: Internal Medicine | Admitting: Internal Medicine

## 2017-01-13 ENCOUNTER — Encounter (HOSPITAL_COMMUNITY): Payer: Self-pay | Admitting: Emergency Medicine

## 2017-01-13 DIAGNOSIS — Z885 Allergy status to narcotic agent status: Secondary | ICD-10-CM

## 2017-01-13 DIAGNOSIS — S72141A Displaced intertrochanteric fracture of right femur, initial encounter for closed fracture: Principal | ICD-10-CM | POA: Diagnosis present

## 2017-01-13 DIAGNOSIS — I10 Essential (primary) hypertension: Secondary | ICD-10-CM | POA: Diagnosis present

## 2017-01-13 DIAGNOSIS — M25561 Pain in right knee: Secondary | ICD-10-CM | POA: Diagnosis not present

## 2017-01-13 DIAGNOSIS — R410 Disorientation, unspecified: Secondary | ICD-10-CM

## 2017-01-13 DIAGNOSIS — W01198A Fall on same level from slipping, tripping and stumbling with subsequent striking against other object, initial encounter: Secondary | ICD-10-CM | POA: Diagnosis present

## 2017-01-13 DIAGNOSIS — D62 Acute posthemorrhagic anemia: Secondary | ICD-10-CM | POA: Diagnosis not present

## 2017-01-13 DIAGNOSIS — Z6830 Body mass index (BMI) 30.0-30.9, adult: Secondary | ICD-10-CM

## 2017-01-13 DIAGNOSIS — I341 Nonrheumatic mitral (valve) prolapse: Secondary | ICD-10-CM | POA: Diagnosis present

## 2017-01-13 DIAGNOSIS — Z7982 Long term (current) use of aspirin: Secondary | ICD-10-CM

## 2017-01-13 DIAGNOSIS — Z888 Allergy status to other drugs, medicaments and biological substances status: Secondary | ICD-10-CM

## 2017-01-13 DIAGNOSIS — S299XXA Unspecified injury of thorax, initial encounter: Secondary | ICD-10-CM | POA: Diagnosis not present

## 2017-01-13 DIAGNOSIS — D801 Nonfamilial hypogammaglobulinemia: Secondary | ICD-10-CM | POA: Diagnosis not present

## 2017-01-13 DIAGNOSIS — Z882 Allergy status to sulfonamides status: Secondary | ICD-10-CM

## 2017-01-13 DIAGNOSIS — D72829 Elevated white blood cell count, unspecified: Secondary | ICD-10-CM | POA: Diagnosis not present

## 2017-01-13 DIAGNOSIS — E785 Hyperlipidemia, unspecified: Secondary | ICD-10-CM | POA: Diagnosis present

## 2017-01-13 DIAGNOSIS — I639 Cerebral infarction, unspecified: Secondary | ICD-10-CM | POA: Diagnosis present

## 2017-01-13 DIAGNOSIS — Z79899 Other long term (current) drug therapy: Secondary | ICD-10-CM

## 2017-01-13 DIAGNOSIS — M1990 Unspecified osteoarthritis, unspecified site: Secondary | ICD-10-CM | POA: Diagnosis present

## 2017-01-13 DIAGNOSIS — D510 Vitamin B12 deficiency anemia due to intrinsic factor deficiency: Secondary | ICD-10-CM | POA: Diagnosis not present

## 2017-01-13 DIAGNOSIS — Z7902 Long term (current) use of antithrombotics/antiplatelets: Secondary | ICD-10-CM

## 2017-01-13 DIAGNOSIS — E669 Obesity, unspecified: Secondary | ICD-10-CM | POA: Diagnosis present

## 2017-01-13 DIAGNOSIS — Z8249 Family history of ischemic heart disease and other diseases of the circulatory system: Secondary | ICD-10-CM

## 2017-01-13 DIAGNOSIS — S8991XA Unspecified injury of right lower leg, initial encounter: Secondary | ICD-10-CM | POA: Diagnosis not present

## 2017-01-13 DIAGNOSIS — I209 Angina pectoris, unspecified: Secondary | ICD-10-CM | POA: Diagnosis present

## 2017-01-13 DIAGNOSIS — T148XXA Other injury of unspecified body region, initial encounter: Secondary | ICD-10-CM | POA: Diagnosis not present

## 2017-01-13 DIAGNOSIS — Z883 Allergy status to other anti-infective agents status: Secondary | ICD-10-CM

## 2017-01-13 DIAGNOSIS — Y92009 Unspecified place in unspecified non-institutional (private) residence as the place of occurrence of the external cause: Secondary | ICD-10-CM

## 2017-01-13 DIAGNOSIS — E876 Hypokalemia: Secondary | ICD-10-CM | POA: Diagnosis present

## 2017-01-13 DIAGNOSIS — D696 Thrombocytopenia, unspecified: Secondary | ICD-10-CM | POA: Diagnosis present

## 2017-01-13 DIAGNOSIS — S72001A Fracture of unspecified part of neck of right femur, initial encounter for closed fracture: Secondary | ICD-10-CM

## 2017-01-13 DIAGNOSIS — N179 Acute kidney failure, unspecified: Secondary | ICD-10-CM | POA: Diagnosis not present

## 2017-01-13 DIAGNOSIS — M5430 Sciatica, unspecified side: Secondary | ICD-10-CM | POA: Diagnosis present

## 2017-01-13 DIAGNOSIS — Z8673 Personal history of transient ischemic attack (TIA), and cerebral infarction without residual deficits: Secondary | ICD-10-CM

## 2017-01-13 DIAGNOSIS — M25551 Pain in right hip: Secondary | ICD-10-CM | POA: Diagnosis not present

## 2017-01-13 DIAGNOSIS — Z91030 Bee allergy status: Secondary | ICD-10-CM

## 2017-01-13 DIAGNOSIS — Z881 Allergy status to other antibiotic agents status: Secondary | ICD-10-CM

## 2017-01-13 DIAGNOSIS — S72001S Fracture of unspecified part of neck of right femur, sequela: Secondary | ICD-10-CM | POA: Diagnosis present

## 2017-01-13 MED ORDER — FENTANYL CITRATE (PF) 100 MCG/2ML IJ SOLN
50.0000 ug | INTRAMUSCULAR | Status: DC | PRN
  Administered 2017-01-14: 50 ug via INTRAVENOUS
  Filled 2017-01-13: qty 2

## 2017-01-13 MED ORDER — ONDANSETRON HCL 4 MG/2ML IJ SOLN
4.0000 mg | Freq: Once | INTRAMUSCULAR | Status: AC
  Administered 2017-01-14: 4 mg via INTRAVENOUS
  Filled 2017-01-13: qty 2

## 2017-01-13 NOTE — ED Provider Notes (Signed)
[facility] DEPT Provider Note   CSN: [id number] Arrival date & time: 01/13/17  2319  By signing my name below, I, Gwenlyn Fudge, attest that this documentation has been prepared under the direction and in the presence of Ripley Fraise, MD. Electronically Signed: Gwenlyn Fudge, ED Scribe. 01/13/17. 11:28 PM.   History   Chief Complaint Chief Complaint  Patient presents with  . Fall   HPI Comments: Mallory Rodriguez is a 83 y.o. Female brought in by EMS who presents to the Emergency Department complaining of constant, severe right hip and upper leg pain s/p fall at 10 PM tonight. Pain is exacerbated with movement of the joint. She state she lost her balance and fell onto a carpeted floor. She denies LOC, head injury or visual disturbances. Pt receives 100 mcg fentanyl from EMS. She is currently taking Plavix. She denies hx of hip or pelvic surgery. Denies headache, chest pain, shortness of breath, abdominal pain. She denies any other complaints at this time.    The history is provided by the patient. No language interpreter was used.  Hip Pain  This is a new (s/p fall) problem. The current episode started 1 to 2 hours ago. The problem occurs constantly. The problem has not changed since onset.Pertinent negatives include no chest pain, no abdominal pain, no headaches and no shortness of breath.    Past Medical History:  Diagnosis Date  . AP (angina pectoris) ([place])   . Claustrophobia   . Diverticulosis of colon   . Hiatal hernia   . HLD (hyperlipidemia)   . HTN (hypertension)   . Hypogammaglobulinemia ([place])    borderline  . MVP (mitral valve prolapse)    and tricuspid leak  . OA (osteoarthritis)   . Pelvic kidney    lower left side  . Pernicious anemia   . Sciatica   . TIA (transient ischemic attack) 8/16    Patient Active Problem List   Diagnosis Date Noted  . Advance directive discussed with patient 12/13/2015  . CVA (cerebral vascular accident) ([place]) 08/31/2015  .  Hemiplegia of nondominant side, late effect of cerebrovascular disease ([place]) 08/17/2015  . Tremor 11/30/2014  . Allergic rhinitis due to pollen 05/18/2013  . Routine general medical examination at a health care facility 11/18/2012  . Osteoarthritis, multiple sites 11/07/2010  . Hyperlipemia 11/02/2010  . PERNICIOUS ANEMIA 11/02/2010  . Mood disorder ([place]) 02/09/2010  . HYPERTENSION, BENIGN 02/09/2010  . Mitral valve disorder 02/09/2010    Past Surgical History:  Procedure Laterality Date  . APPENDECTOMY    . basal cell carcinoma removal     Back  . BREAST BIOPSY Right    neg  . BREAST LUMPECTOMY     Right (fatty tumor)  . CHOLECYSTECTOMY    . DILATION AND CURETTAGE OF UTERUS    . excision vestibular glands    . Film removed Left eye    . INGUINAL HERNIA REPAIR     Left side  . INTRAOCULAR LENS INSERTION     Bilateral  . KNEE ARTHROSCOPY W/ PARTIAL MEDIAL MENISCECTOMY  08/2009   right  . Right ovary and tube corpus    . TONSILLECTOMY    . TOTAL ABDOMINAL HYSTERECTOMY      OB History    No data available       Home Medications    Prior to Admission medications   Medication Sig Start Date End Date Taking? Authorizing Provider  acetaminophen (TYLENOL) 650 MG CR tablet Take 650 mg by mouth 3 (  three) times daily.    Historical Provider, MD  amoxicillin (AMOXIL) 500 MG capsule TAKE 4 CAPSULES BY MOUTH 1 HOUR PRIOR TO DENTAL APPT 02/17/16   Historical Provider, MD  aspirin 81 MG tablet Take 81 mg by mouth daily.    Historical Provider, MD  CALCIUM-VITAMIN D-VITAMIN K PO Take 1 each by mouth daily.    Historical Provider, MD  clopidogrel (PLAVIX) 75 MG tablet TAKE 1 TABLET BY MOUTH EVERY DAY 10/11/16   Venia Carbon, MD  cyanocobalamin (,VITAMIN B-12,) 1000 MCG/ML injection Inject 1,000 mcg into the muscle every 3 (three) months.    Historical Provider, MD  EPINEPHrine (EPIPEN JR) 0.15 MG/0.3ML injection INJECT ONE SYRINGEFUL INTRAMUSCULARLY AS NEEDED FOR  ANAPHYLAXIS  04/27/16   Venia Carbon, MD  hydrochlorothiazide (HYDRODIURIL) 25 MG tablet TAKE ONE TABLET BY MOUTH DAILY. 11/01/16   Venia Carbon, MD  lovastatin (MEVACOR) 40 MG tablet Take 1 tablet (40 mg total) by mouth at bedtime. 12/18/16   Venia Carbon, MD  nitroGLYCERIN (NITROLINGUAL) 0.4 MG/SPRAY spray PLACE ONE SPRAY UNDER THE TONGUE EVERY FIVE MINUTES AS NEEDED FOR CHEST PAIN 07/06/16   Venia Carbon, MD  Omega-3 Fatty Acids (FISH OIL) 1200 MG CAPS Take 1 capsule by mouth daily.    Historical Provider, MD  Polyethyl Glycol-Propyl Glycol (SYSTANE OP) Apply 1 drop to eye as needed (for dry eyes).    Historical Provider, MD  Probiotic Product (ALIGN) 4 MG CAPS Take 1 capsule by mouth daily.    Historical Provider, MD    Family History Family History  Problem Relation Age of Onset  . Heart attack Father 59    CABG  . Hypertension Father   . Dementia Mother   . Cancer Maternal Aunt     Breast-multiple aunts and Gm  . Cancer Maternal Uncle     Bladder and lung  . Diabetes Paternal Grandmother   . Hypertension Brother     Social History Social History  Substance Use Topics  . Smoking status: Never Smoker  . Smokeless tobacco: Never Used  . Alcohol use Yes     Comment: Regular wine a few nights a week    Allergies   Bee venom; Contrast media [iodinated diagnostic agents]; Lipitor [atorvastatin]; Ace inhibitors; Hydrocodone; Macrodantin; Neosporin [neomycin-bacitracin zn-polymyx]; Sulfa antibiotics; Epinephrine; and Prednisone   Review of Systems Review of Systems  Eyes: Negative for visual disturbance.  Respiratory: Negative for shortness of breath.   Cardiovascular: Negative for chest pain.  Gastrointestinal: Negative for abdominal pain.  Musculoskeletal: Positive for arthralgias. Negative for back pain and neck pain.  Neurological: Negative for syncope and headaches.  All other systems reviewed and are negative.  Physical Exam Updated Vital Signs BP 147/81 (BP  Location: Right Arm)   Pulse 81   Temp 97.3 F (36.3 C) (Oral)   Resp 18   Ht 5\' 1"  (1.549 m)   Wt 160 lb (72.6 kg)   SpO2 96%   BMI 30.23 kg/m   Physical Exam CONSTITUTIONAL: Elderly, frail HEAD: Normocephalic/atraumatic EYES: EOMI/PERRL ENMT: Mucous membranes moist NECK: supple no meningeal signs SPINE/BACK:cervical spine tenderness CV: S1/S2 noted, no murmurs/rubs/gallops noted LUNGS: Lungs are clear to auscultation bilaterally, no apparent distress ABDOMEN: soft, nontender NEURO: Pt is awake/alert/appropriate, moves all extremitiesx4.  No facial droop.   EXTREMITIES: pulses normal/equal, right lower extremity is shortened and rotated, tenderness to palpation to right hip and right knee, All other extremities/joints palpated/ranged and nontender SKIN: warm, color normal PSYCH: no abnormalities  of mood noted, alert and oriented to situation   ED Treatments / Results  DIAGNOSTIC STUDIES: Oxygen Saturation is 96% on RA, adequate by my interpretation.    COORDINATION OF CARE: 11:31 PM Discussed treatment plan with pt at bedside which includes DG Right Hip, DG Chest, Zofran, Fentanyl and blood work and and pt agreed to plan.  Labs (all labs ordered are listed, but only abnormal results are displayed) Labs Reviewed  BASIC METABOLIC PANEL - Abnormal; Notable for the following:       Result Value   Potassium 3.4 (*)    CO2 21 (*)    Glucose, Bld 152 (*)    BUN 27 (*)    GFR calc non Af Amer 53 (*)    All other components within normal limits  CBC WITH DIFFERENTIAL/PLATELET - Abnormal; Notable for the following:    WBC 14.0 (*)    RBC 3.86 (*)    Neutro Abs 11.9 (*)    All other components within normal limits  PROTIME-INR  TYPE AND SCREEN  ABO/RH    EKG  EKG Interpretation  Date/Time:  Monday January 14 2017 00:06:03 EST Ventricular Rate:  79 PR Interval:    QRS Duration: 83 QT Interval:  419 QTC Calculation: 481 R Axis:   -32 Text Interpretation:  Sinus  rhythm Biatrial enlargement Left axis deviation Borderline repolarization abnormality Interpretation limited secondary to artifact Confirmed by Christy Gentles  MD, Gisela ([id number]) on 01/14/2017 12:31:49 AM       Radiology Dg Chest 1 View  Result Date: 01/14/2017 CLINICAL DATA:  Fall with right hip pain. EXAM: CHEST 1 VIEW COMPARISON:  Chest CT 02/22/2015 FINDINGS: The cardiomediastinal contours are normal. The heart is at the upper limits of normal in size. Subsegmental atelectasis or scarring at the left lung base. Pulmonary vasculature is normal. No consolidation, pleural effusion, or pneumothorax. No acute osseous abnormalities are seen. IMPRESSION: Subsegmental left basilar atelectasis. Otherwise no acute abnormality. Electronically Signed   By: Jeb Levering M.D.   On: 01/14/2017 00:36   Dg Knee Complete 4 Views Right  Result Date: 01/14/2017 CLINICAL DATA:  Fall with right hip and knee pain. EXAM: RIGHT KNEE - COMPLETE 4+ VIEW COMPARISON:  None. FINDINGS: No acute fracture or subluxation. Tricompartmental osteoarthritis with peripheral spurring. Spurring of the tibial spines. There is narrowing of the lateral tibiofemoral joint space. Trace joint effusion. Quadriceps tendon enthesopathy. IMPRESSION: Moderate tricompartmental osteoarthritis without acute fracture or subluxation. Electronically Signed   By: Jeb Levering M.D.   On: 01/14/2017 00:37   Dg Hip Unilat With Pelvis 2-3 Views Right  Result Date: 01/14/2017 CLINICAL DATA:  Post fall with right hip pain. EXAM: DG HIP (WITH OR WITHOUT PELVIS) 2-3V RIGHT COMPARISON:  None. FINDINGS: Comminuted displaced intertrochanteric right hip fracture involving the lesser and greater trochanters. There proximal migration of the femoral shaft. Femoral head remains seated in the acetabulum. No additional acute fracture. Pubic symphysis and sacroiliac joints are congruent. Pubic rami appear intact. IMPRESSION: Comminuted displaced intertrochanteric right  hip fracture. Electronically Signed   By: Jeb Levering M.D.   On: 01/14/2017 00:38    Procedures Procedures (including critical care time)  Medications Ordered in ED Medications  fentaNYL (SUBLIMAZE) injection 50 mcg (50 mcg Intravenous Given 01/14/17 0000)  ondansetron (ZOFRAN) injection 4 mg (4 mg Intravenous Given 01/14/17 0000)     Initial Impression / Assessment and Plan / ED Course  I have reviewed the triage vital signs and the nursing notes.  Pertinent  labs & imaging results that were available during my care of the patient were reviewed by me and considered in my medical decision making (see chart for details).  Clinical Course     12:58 AM D/w dr Alvan Dame Admit to medical service Keep NPO as expect surgery in next 12-24 hours (pt is on plavix) Consult triad 1:09 AM D/w dr Blaine Hamper for admission Pt will need to have plavix held Pt stable Final Clinical Impressions(s) / ED Diagnoses   Final diagnoses:  Closed right hip fracture, initial encounter [facility])    New Prescriptions New Prescriptions   No medications on file   I personally performed the services described in this documentation, which was scribed in my presence. The recorded information has been reviewed and is accurate.        Ripley Fraise, MD 01/14/17 [phone]

## 2017-01-13 NOTE — ED Triage Notes (Addendum)
Per EMS: pt was at home and fell on a wooden chest.  Pt c/o right hip pain. R leg rotated and shortened. Pt denies hitting head or having neck or back pain. AxOx4 Pt was given 100 of fentanyl.

## 2017-01-14 ENCOUNTER — Inpatient Hospital Stay (HOSPITAL_COMMUNITY): Payer: Medicare Other | Admitting: Anesthesiology

## 2017-01-14 ENCOUNTER — Inpatient Hospital Stay (HOSPITAL_COMMUNITY): Payer: Medicare Other

## 2017-01-14 ENCOUNTER — Encounter (HOSPITAL_COMMUNITY): Payer: Self-pay | Admitting: Anesthesiology

## 2017-01-14 ENCOUNTER — Encounter (HOSPITAL_COMMUNITY)
Admission: EM | Disposition: A | Discharge status: Skilled Nursing Facility | Payer: Self-pay | Source: Home / Self Care | Attending: Internal Medicine

## 2017-01-14 ENCOUNTER — Emergency Department (HOSPITAL_COMMUNITY): Payer: Medicare Other

## 2017-01-14 DIAGNOSIS — Z7902 Long term (current) use of antithrombotics/antiplatelets: Secondary | ICD-10-CM | POA: Diagnosis not present

## 2017-01-14 DIAGNOSIS — Z7409 Other reduced mobility: Secondary | ICD-10-CM | POA: Diagnosis not present

## 2017-01-14 DIAGNOSIS — I209 Angina pectoris, unspecified: Secondary | ICD-10-CM | POA: Diagnosis present

## 2017-01-14 DIAGNOSIS — M25561 Pain in right knee: Secondary | ICD-10-CM | POA: Diagnosis not present

## 2017-01-14 DIAGNOSIS — D696 Thrombocytopenia, unspecified: Secondary | ICD-10-CM | POA: Diagnosis present

## 2017-01-14 DIAGNOSIS — E876 Hypokalemia: Secondary | ICD-10-CM | POA: Diagnosis present

## 2017-01-14 DIAGNOSIS — S72141A Displaced intertrochanteric fracture of right femur, initial encounter for closed fracture: Secondary | ICD-10-CM | POA: Diagnosis present

## 2017-01-14 DIAGNOSIS — S72001D Fracture of unspecified part of neck of right femur, subsequent encounter for closed fracture with routine healing: Secondary | ICD-10-CM | POA: Diagnosis not present

## 2017-01-14 DIAGNOSIS — S299XXA Unspecified injury of thorax, initial encounter: Secondary | ICD-10-CM | POA: Diagnosis not present

## 2017-01-14 DIAGNOSIS — Z8249 Family history of ischemic heart disease and other diseases of the circulatory system: Secondary | ICD-10-CM | POA: Diagnosis not present

## 2017-01-14 DIAGNOSIS — S8990XA Unspecified injury of unspecified lower leg, initial encounter: Secondary | ICD-10-CM | POA: Diagnosis not present

## 2017-01-14 DIAGNOSIS — E669 Obesity, unspecified: Secondary | ICD-10-CM | POA: Diagnosis present

## 2017-01-14 DIAGNOSIS — Z91030 Bee allergy status: Secondary | ICD-10-CM | POA: Diagnosis not present

## 2017-01-14 DIAGNOSIS — R51 Headache: Secondary | ICD-10-CM | POA: Diagnosis not present

## 2017-01-14 DIAGNOSIS — S72001A Fracture of unspecified part of neck of right femur, initial encounter for closed fracture: Secondary | ICD-10-CM

## 2017-01-14 DIAGNOSIS — R531 Weakness: Secondary | ICD-10-CM | POA: Diagnosis not present

## 2017-01-14 DIAGNOSIS — Z4789 Encounter for other orthopedic aftercare: Secondary | ICD-10-CM | POA: Diagnosis not present

## 2017-01-14 DIAGNOSIS — I10 Essential (primary) hypertension: Secondary | ICD-10-CM

## 2017-01-14 DIAGNOSIS — D62 Acute posthemorrhagic anemia: Secondary | ICD-10-CM | POA: Diagnosis not present

## 2017-01-14 DIAGNOSIS — Z8673 Personal history of transient ischemic attack (TIA), and cerebral infarction without residual deficits: Secondary | ICD-10-CM | POA: Diagnosis not present

## 2017-01-14 DIAGNOSIS — S8991XA Unspecified injury of right lower leg, initial encounter: Secondary | ICD-10-CM | POA: Diagnosis not present

## 2017-01-14 DIAGNOSIS — M1990 Unspecified osteoarthritis, unspecified site: Secondary | ICD-10-CM | POA: Diagnosis present

## 2017-01-14 DIAGNOSIS — W01198A Fall on same level from slipping, tripping and stumbling with subsequent striking against other object, initial encounter: Secondary | ICD-10-CM | POA: Diagnosis present

## 2017-01-14 DIAGNOSIS — Z79899 Other long term (current) drug therapy: Secondary | ICD-10-CM | POA: Diagnosis not present

## 2017-01-14 DIAGNOSIS — D72829 Elevated white blood cell count, unspecified: Secondary | ICD-10-CM | POA: Diagnosis not present

## 2017-01-14 DIAGNOSIS — Z6830 Body mass index (BMI) 30.0-30.9, adult: Secondary | ICD-10-CM | POA: Diagnosis not present

## 2017-01-14 DIAGNOSIS — E785 Hyperlipidemia, unspecified: Secondary | ICD-10-CM | POA: Diagnosis present

## 2017-01-14 DIAGNOSIS — S72001S Fracture of unspecified part of neck of right femur, sequela: Secondary | ICD-10-CM | POA: Diagnosis present

## 2017-01-14 DIAGNOSIS — M5430 Sciatica, unspecified side: Secondary | ICD-10-CM | POA: Diagnosis present

## 2017-01-14 DIAGNOSIS — Y92009 Unspecified place in unspecified non-institutional (private) residence as the place of occurrence of the external cause: Secondary | ICD-10-CM | POA: Diagnosis not present

## 2017-01-14 DIAGNOSIS — D801 Nonfamilial hypogammaglobulinemia: Secondary | ICD-10-CM | POA: Diagnosis present

## 2017-01-14 DIAGNOSIS — I341 Nonrheumatic mitral (valve) prolapse: Secondary | ICD-10-CM | POA: Diagnosis present

## 2017-01-14 DIAGNOSIS — Z7982 Long term (current) use of aspirin: Secondary | ICD-10-CM | POA: Diagnosis not present

## 2017-01-14 DIAGNOSIS — M25551 Pain in right hip: Secondary | ICD-10-CM | POA: Diagnosis not present

## 2017-01-14 DIAGNOSIS — Z888 Allergy status to other drugs, medicaments and biological substances status: Secondary | ICD-10-CM | POA: Diagnosis not present

## 2017-01-14 DIAGNOSIS — W08XXXA Fall from other furniture, initial encounter: Secondary | ICD-10-CM | POA: Diagnosis not present

## 2017-01-14 DIAGNOSIS — Z885 Allergy status to narcotic agent status: Secondary | ICD-10-CM | POA: Diagnosis not present

## 2017-01-14 DIAGNOSIS — N179 Acute kidney failure, unspecified: Secondary | ICD-10-CM | POA: Diagnosis not present

## 2017-01-14 DIAGNOSIS — R410 Disorientation, unspecified: Secondary | ICD-10-CM | POA: Diagnosis not present

## 2017-01-14 DIAGNOSIS — D510 Vitamin B12 deficiency anemia due to intrinsic factor deficiency: Secondary | ICD-10-CM

## 2017-01-14 DIAGNOSIS — M6250 Muscle wasting and atrophy, not elsewhere classified, unspecified site: Secondary | ICD-10-CM | POA: Diagnosis not present

## 2017-01-14 HISTORY — PX: INTRAMEDULLARY (IM) NAIL INTERTROCHANTERIC: SHX5875

## 2017-01-14 LAB — BASIC METABOLIC PANEL
ANION GAP: 15 (ref 5–15)
BUN: 27 mg/dL — ABNORMAL HIGH (ref 6–20)
CALCIUM: 9 mg/dL (ref 8.9–10.3)
CO2: 21 mmol/L — AB (ref 22–32)
CREATININE: 0.97 mg/dL (ref 0.44–1.00)
Chloride: 102 mmol/L (ref 101–111)
GFR calc Af Amer: >60 mL/min (ref >60)
GFR calc non Af Amer: 53 mL/min — ABNORMAL LOW (ref >60)
Glucose, Bld: 152 mg/dL — ABNORMAL HIGH (ref 65–99)
Potassium: 3.4 mmol/L — ABNORMAL LOW (ref 3.5–5.1)
SODIUM: 138 mmol/L (ref 135–145)

## 2017-01-14 LAB — CBC WITH DIFFERENTIAL/PLATELET
Basophils Absolute: 0 10*3/uL (ref 0.0–0.1)
Basophils Relative: 0 %
EOS PCT: 0 %
Eosinophils Absolute: 0 10*3/uL (ref 0.0–0.7)
HCT: 36.3 % (ref 36.0–46.0)
Hemoglobin: 12 g/dL (ref 12.0–15.0)
LYMPHS ABS: 1.2 10*3/uL (ref 0.7–4.0)
LYMPHS PCT: 8 %
MCH: 31.1 pg (ref 26.0–34.0)
MCHC: 33.1 g/dL (ref 30.0–36.0)
MCV: 94 fL (ref 78.0–100.0)
MONO ABS: 0.9 10*3/uL (ref 0.1–1.0)
Monocytes Relative: 6 %
NEUTROS PCT: 86 %
Neutro Abs: 11.9 10*3/uL — ABNORMAL HIGH (ref 1.7–7.7)
Platelets: 211 10*3/uL (ref 150–400)
RBC: 3.86 MIL/uL — ABNORMAL LOW (ref 3.87–5.11)
RDW: 14.6 % (ref 11.5–15.5)
WBC: 14 10*3/uL — ABNORMAL HIGH (ref 4.0–10.5)

## 2017-01-14 LAB — PROTIME-INR
INR: 0.96
Prothrombin Time: 12.8 seconds (ref 11.4–15.2)

## 2017-01-14 LAB — SURGICAL PCR SCREEN
MRSA, PCR: NEGATIVE
STAPHYLOCOCCUS AUREUS: NEGATIVE

## 2017-01-14 LAB — BRAIN NATRIURETIC PEPTIDE: B Natriuretic Peptide: 37.3 pg/mL (ref 0.0–100.0)

## 2017-01-14 LAB — ABO/RH: ABO/RH(D): A POS

## 2017-01-14 SURGERY — FIXATION, FRACTURE, INTERTROCHANTERIC, WITH INTRAMEDULLARY ROD
Anesthesia: General | Site: Hip | Laterality: Right

## 2017-01-14 MED ORDER — LACTATED RINGERS IV SOLN
INTRAVENOUS | Status: DC
  Administered 2017-01-14: 10 mL/h via INTRAVENOUS

## 2017-01-14 MED ORDER — ALUM & MAG HYDROXIDE-SIMETH 200-200-20 MG/5ML PO SUSP: 30.0000 mL | ORAL | Status: DC | PRN

## 2017-01-14 MED ORDER — ROCURONIUM BROMIDE 50 MG/5ML IV SOSY
PREFILLED_SYRINGE | INTRAVENOUS | Status: AC
  Filled 2017-01-14: qty 5

## 2017-01-14 MED ORDER — EPHEDRINE 5 MG/ML INJ
INTRAVENOUS | Status: AC
  Filled 2017-01-14: qty 10

## 2017-01-14 MED ORDER — OMEGA-3-ACID ETHYL ESTERS 1 G PO CAPS
1.0000 g | ORAL_CAPSULE | Freq: Every day | ORAL | Status: DC
  Administered 2017-01-15 – 2017-01-20 (×6): 1 g via ORAL
  Filled 2017-01-14 (×7): qty 1

## 2017-01-14 MED ORDER — CYANOCOBALAMIN 1000 MCG/ML IJ SOLN: 1000.0000 ug | INTRAMUSCULAR | Status: DC

## 2017-01-14 MED ORDER — ONDANSETRON HCL 4 MG/2ML IJ SOLN: 4.0000 mg | Freq: Three times a day (TID) | INTRAMUSCULAR | Status: DC | PRN

## 2017-01-14 MED ORDER — HYDROMORPHONE HCL 2 MG/ML IJ SOLN
0.1000 mg | INTRAMUSCULAR | Status: DC | PRN
Start: 2017-01-14 — End: 2017-01-20

## 2017-01-14 MED ORDER — LIDOCAINE HCL (CARDIAC) 20 MG/ML IV SOLN
INTRAVENOUS | Status: DC | PRN
  Administered 2017-01-14: 30 mg via INTRAVENOUS

## 2017-01-14 MED ORDER — ACETAMINOPHEN 325 MG PO TABS
650.0000 mg | ORAL_TABLET | Freq: Three times a day (TID) | ORAL | Status: DC
  Administered 2017-01-14 – 2017-01-20 (×17): 650 mg via ORAL
  Filled 2017-01-14 (×17): qty 2

## 2017-01-14 MED ORDER — LACTATED RINGERS IV SOLN
INTRAVENOUS | Status: DC | PRN
  Administered 2017-01-14 (×2): via INTRAVENOUS

## 2017-01-14 MED ORDER — SUCCINYLCHOLINE CHLORIDE 20 MG/ML IJ SOLN
INTRAMUSCULAR | Status: DC | PRN
  Administered 2017-01-14: 100 mg via INTRAVENOUS

## 2017-01-14 MED ORDER — EPHEDRINE SULFATE 50 MG/ML IJ SOLN
INTRAMUSCULAR | Status: DC | PRN
  Administered 2017-01-14: 10 mg via INTRAVENOUS

## 2017-01-14 MED ORDER — OXYCODONE HCL 5 MG PO TABS
5.0000 mg | ORAL_TABLET | ORAL | Status: DC | PRN
Start: 2017-01-14 — End: 2017-01-20
  Administered 2017-01-14 – 2017-01-19 (×7): 5 mg via ORAL
  Administered 2017-01-19 – 2017-01-20 (×2): 10 mg via ORAL
  Filled 2017-01-14 (×7): qty 1
  Filled 2017-01-14 (×2): qty 2

## 2017-01-14 MED ORDER — FENTANYL CITRATE (PF) 100 MCG/2ML IJ SOLN
INTRAMUSCULAR | Status: AC
  Filled 2017-01-14: qty 2

## 2017-01-14 MED ORDER — PHENOL 1.4 % MT LIQD: 1.0000 | OROMUCOSAL | Status: DC | PRN

## 2017-01-14 MED ORDER — MORPHINE SULFATE (PF) 4 MG/ML IV SOLN
2.0000 mg | INTRAVENOUS | Status: DC | PRN
  Administered 2017-01-14 (×2): 2 mg via INTRAVENOUS
  Filled 2017-01-14 (×2): qty 1

## 2017-01-14 MED ORDER — PROMETHAZINE HCL 25 MG/ML IJ SOLN: 6.2500 mg | INTRAMUSCULAR | Status: DC | PRN

## 2017-01-14 MED ORDER — SUGAMMADEX SODIUM 200 MG/2ML IV SOLN
INTRAVENOUS | Status: DC | PRN
  Administered 2017-01-14: 145.2 mg via INTRAVENOUS

## 2017-01-14 MED ORDER — LIDOCAINE 2% (20 MG/ML) 5 ML SYRINGE
INTRAMUSCULAR | Status: AC
  Filled 2017-01-14: qty 5

## 2017-01-14 MED ORDER — HYDROCHLOROTHIAZIDE 25 MG PO TABS
25.0000 mg | ORAL_TABLET | Freq: Every day | ORAL | Status: DC
  Administered 2017-01-15 – 2017-01-20 (×6): 25 mg via ORAL
  Filled 2017-01-14 (×6): qty 1

## 2017-01-14 MED ORDER — ONDANSETRON HCL 4 MG/2ML IJ SOLN
INTRAMUSCULAR | Status: DC | PRN
Start: 2017-01-14 — End: 2017-01-14
  Administered 2017-01-14: 4 mg via INTRAVENOUS

## 2017-01-14 MED ORDER — CHLORHEXIDINE GLUCONATE 4 % EX LIQD: 60.0000 mL | Freq: Once | CUTANEOUS | Status: DC

## 2017-01-14 MED ORDER — POLYVINYL ALCOHOL 1.4 % OP SOLN: OPHTHALMIC | Status: DC | PRN

## 2017-01-14 MED ORDER — ASPIRIN EC 325 MG PO TBEC
325.0000 mg | DELAYED_RELEASE_TABLET | Freq: Every day | ORAL | Status: DC
  Administered 2017-01-15 – 2017-01-20 (×6): 325 mg via ORAL
  Filled 2017-01-14 (×6): qty 1

## 2017-01-14 MED ORDER — NITROGLYCERIN 0.4 MG/SPRAY TL SOLN: 1.0000 | Status: DC | PRN

## 2017-01-14 MED ORDER — OXYCODONE-ACETAMINOPHEN 5-325 MG PO TABS: 1.0000 | ORAL_TABLET | ORAL | Status: DC | PRN

## 2017-01-14 MED ORDER — FENTANYL CITRATE (PF) 100 MCG/2ML IJ SOLN
INTRAMUSCULAR | Status: DC | PRN
  Administered 2017-01-14: 100 ug via INTRAVENOUS

## 2017-01-14 MED ORDER — POLYETHYLENE GLYCOL 3350 17 G PO PACK
17.0000 g | PACK | Freq: Every day | ORAL | Status: DC | PRN
  Administered 2017-01-16: 17 g via ORAL
  Filled 2017-01-14: qty 1

## 2017-01-14 MED ORDER — METOCLOPRAMIDE HCL 5 MG/ML IJ SOLN: 5.0000 mg | Freq: Three times a day (TID) | INTRAMUSCULAR | Status: DC | PRN

## 2017-01-14 MED ORDER — PHENYLEPHRINE HCL 10 MG/ML IJ SOLN
INTRAVENOUS | Status: DC | PRN
  Administered 2017-01-14: 25 ug/min via INTRAVENOUS

## 2017-01-14 MED ORDER — ACETAMINOPHEN 650 MG RE SUPP
650.0000 mg | Freq: Four times a day (QID) | RECTAL | Status: DC | PRN
Start: 2017-01-14 — End: 2017-01-20

## 2017-01-14 MED ORDER — MENTHOL 3 MG MT LOZG: 1.0000 | LOZENGE | OROMUCOSAL | Status: DC | PRN

## 2017-01-14 MED ORDER — ONDANSETRON HCL 4 MG/2ML IJ SOLN: 4.0000 mg | Freq: Four times a day (QID) | INTRAMUSCULAR | Status: DC | PRN

## 2017-01-14 MED ORDER — EPINEPHRINE 0.15 MG/0.3ML IJ SOAJ: 0.1500 mg | INTRAMUSCULAR | Status: DC | PRN

## 2017-01-14 MED ORDER — DOCUSATE SODIUM 100 MG PO CAPS: 100.0000 mg | ORAL_CAPSULE | Freq: Two times a day (BID) | ORAL | Status: DC | PRN

## 2017-01-14 MED ORDER — DOCUSATE SODIUM 100 MG PO CAPS
100.0000 mg | ORAL_CAPSULE | Freq: Two times a day (BID) | ORAL | Status: DC
Start: 2017-01-14 — End: 2017-01-20
  Administered 2017-01-14 – 2017-01-20 (×12): 100 mg via ORAL
  Filled 2017-01-14 (×12): qty 1

## 2017-01-14 MED ORDER — POTASSIUM CHLORIDE 20 MEQ/15ML (10%) PO SOLN
20.0000 meq | Freq: Once | ORAL | Status: AC
  Administered 2017-01-14: 20 meq via ORAL
  Filled 2017-01-14: qty 15

## 2017-01-14 MED ORDER — 0.9 % SODIUM CHLORIDE (POUR BTL) OPTIME
TOPICAL | Status: DC | PRN
  Administered 2017-01-14: 1000 mL

## 2017-01-14 MED ORDER — PHENYLEPHRINE 40 MCG/ML (10ML) SYRINGE FOR IV PUSH (FOR BLOOD PRESSURE SUPPORT)
PREFILLED_SYRINGE | INTRAVENOUS | Status: AC
  Filled 2017-01-14: qty 10

## 2017-01-14 MED ORDER — ACETAMINOPHEN 325 MG PO TABS
650.0000 mg | ORAL_TABLET | Freq: Four times a day (QID) | ORAL | Status: DC | PRN
  Filled 2017-01-14: qty 2

## 2017-01-14 MED ORDER — CEFAZOLIN IN D5W 1 GM/50ML IV SOLN
1.0000 g | Freq: Four times a day (QID) | INTRAVENOUS | Status: AC
  Administered 2017-01-14 – 2017-01-15 (×2): 1 g via INTRAVENOUS
  Filled 2017-01-14 (×2): qty 50

## 2017-01-14 MED ORDER — ZOLPIDEM TARTRATE 5 MG PO TABS: 5.0000 mg | ORAL_TABLET | Freq: Every evening | ORAL | Status: DC | PRN

## 2017-01-14 MED ORDER — ONDANSETRON HCL 4 MG/2ML IJ SOLN
INTRAMUSCULAR | Status: AC
  Filled 2017-01-14: qty 2

## 2017-01-14 MED ORDER — ONDANSETRON HCL 4 MG PO TABS: 4.0000 mg | ORAL_TABLET | Freq: Four times a day (QID) | ORAL | Status: DC | PRN

## 2017-01-14 MED ORDER — METHOCARBAMOL 500 MG PO TABS
500.0000 mg | ORAL_TABLET | Freq: Four times a day (QID) | ORAL | Status: DC | PRN
  Administered 2017-01-14 – 2017-01-20 (×6): 500 mg via ORAL
  Filled 2017-01-14 (×6): qty 1

## 2017-01-14 MED ORDER — RISAQUAD PO CAPS
1.0000 | ORAL_CAPSULE | Freq: Every day | ORAL | Status: DC
  Administered 2017-01-15 – 2017-01-20 (×6): 1 via ORAL
  Filled 2017-01-14 (×7): qty 1

## 2017-01-14 MED ORDER — ROCURONIUM BROMIDE 100 MG/10ML IV SOLN
INTRAVENOUS | Status: DC | PRN
  Administered 2017-01-14: 30 mg via INTRAVENOUS

## 2017-01-14 MED ORDER — POVIDONE-IODINE 10 % EX SWAB: 2.0000 "application " | Freq: Once | CUTANEOUS | Status: DC

## 2017-01-14 MED ORDER — CALCIUM CARBONATE-VITAMIN D 500-200 MG-UNIT PO TABS
1.0000 | ORAL_TABLET | Freq: Every day | ORAL | Status: DC
  Administered 2017-01-15 – 2017-01-20 (×6): 1 via ORAL
  Filled 2017-01-14 (×7): qty 1

## 2017-01-14 MED ORDER — PROPOFOL 10 MG/ML IV BOLUS
INTRAVENOUS | Status: DC | PRN
  Administered 2017-01-14: 140 mg via INTRAVENOUS

## 2017-01-14 MED ORDER — HYDROMORPHONE HCL 1 MG/ML IJ SOLN: 0.2500 mg | INTRAMUSCULAR | Status: DC | PRN

## 2017-01-14 MED ORDER — DEXTROSE 5 % IV SOLN: 500.0000 mg | Freq: Three times a day (TID) | INTRAVENOUS | Status: DC | PRN

## 2017-01-14 MED ORDER — SODIUM CHLORIDE 0.9 % IV SOLN
INTRAVENOUS | Status: DC
  Administered 2017-01-14 (×2): via INTRAVENOUS

## 2017-01-14 MED ORDER — PHENYLEPHRINE HCL 10 MG/ML IJ SOLN
INTRAMUSCULAR | Status: DC | PRN
  Administered 2017-01-14: 80 ug via INTRAVENOUS

## 2017-01-14 MED ORDER — SODIUM CHLORIDE 0.9 % IV SOLN
INTRAVENOUS | Status: DC
  Administered 2017-01-14: 18:00:00 via INTRAVENOUS

## 2017-01-14 MED ORDER — CEFAZOLIN SODIUM-DEXTROSE 2-4 GM/100ML-% IV SOLN
2.0000 g | INTRAVENOUS | Status: AC
  Administered 2017-01-14: 2 g via INTRAVENOUS
  Filled 2017-01-14 (×2): qty 100

## 2017-01-14 MED ORDER — METOCLOPRAMIDE HCL 5 MG PO TABS: 5.0000 mg | ORAL_TABLET | Freq: Three times a day (TID) | ORAL | Status: DC | PRN

## 2017-01-14 MED ORDER — METHOCARBAMOL 1000 MG/10ML IJ SOLN
500.0000 mg | Freq: Four times a day (QID) | INTRAVENOUS | Status: DC | PRN
  Filled 2017-01-14: qty 5

## 2017-01-14 MED ORDER — SUCCINYLCHOLINE CHLORIDE 200 MG/10ML IV SOSY
PREFILLED_SYRINGE | INTRAVENOUS | Status: AC
  Filled 2017-01-14: qty 10

## 2017-01-14 MED ORDER — ENSURE ENLIVE PO LIQD
237.0000 mL | Freq: Two times a day (BID) | ORAL | Status: DC
  Administered 2017-01-15 – 2017-01-19 (×6): 237 mL via ORAL

## 2017-01-14 MED ORDER — POLYETHYLENE GLYCOL 3350 17 G PO PACK: 17.0000 g | PACK | Freq: Every day | ORAL | Status: DC | PRN

## 2017-01-14 MED ORDER — FERROUS SULFATE 325 (65 FE) MG PO TABS
325.0000 mg | ORAL_TABLET | Freq: Two times a day (BID) | ORAL | Status: DC
Start: 2017-01-15 — End: 2017-01-20
  Administered 2017-01-15 – 2017-01-20 (×11): 325 mg via ORAL
  Filled 2017-01-14 (×11): qty 1

## 2017-01-14 MED ORDER — SUGAMMADEX SODIUM 200 MG/2ML IV SOLN
INTRAVENOUS | Status: AC
  Filled 2017-01-14: qty 2

## 2017-01-14 SURGICAL SUPPLY — 36 items
BIT DRILL CANN LG 4.3MM (BIT) IMPLANT
COVER PERINEAL POST (MISCELLANEOUS) ×3 IMPLANT
COVER SURGICAL LIGHT HANDLE (MISCELLANEOUS) ×3 IMPLANT
DRAPE STERI IOBAN 125X83 (DRAPES) ×3 IMPLANT
DRILL BIT CANN LG 4.3MM (BIT) ×3
DRSG MEPILEX BORDER 4X12 (GAUZE/BANDAGES/DRESSINGS) ×6 IMPLANT
DRSG MEPILEX BORDER 4X8 (GAUZE/BANDAGES/DRESSINGS) ×2 IMPLANT
DURAPREP 26ML APPLICATOR (WOUND CARE) ×3 IMPLANT
ELECT REM PT RETURN 9FT ADLT (ELECTROSURGICAL) ×3
ELECTRODE REM PT RTRN 9FT ADLT (ELECTROSURGICAL) ×1 IMPLANT
FACESHIELD WRAPAROUND (MASK) ×6 IMPLANT
FACESHIELD WRAPAROUND OR TEAM (MASK) ×2 IMPLANT
GOWN STRL REUS W/ TWL LRG LVL3 (GOWN DISPOSABLE) ×2 IMPLANT
GOWN STRL REUS W/TWL 2XL LVL3 (GOWN DISPOSABLE) ×3 IMPLANT
GOWN STRL REUS W/TWL LRG LVL3 (GOWN DISPOSABLE) ×6
GUIDEWIRE BALL NOSE 80CM (WIRE) ×2 IMPLANT
HIP FR NAIL LAG SCREW 10.5X110 (Orthopedic Implant) ×3 IMPLANT
KIT ROOM TURNOVER OR (KITS) ×3 IMPLANT
LINER BOOT UNIVERSAL DISP (MISCELLANEOUS) ×3 IMPLANT
MANIFOLD NEPTUNE II (INSTRUMENTS) ×3 IMPLANT
NAIL HIP FRACT 130D 11X180 (Screw) ×2 IMPLANT
NS IRRIG 1000ML POUR BTL (IV SOLUTION) ×3 IMPLANT
PACK GENERAL/GYN (CUSTOM PROCEDURE TRAY) ×3 IMPLANT
PAD ARMBOARD 7.5X6 YLW CONV (MISCELLANEOUS) ×6 IMPLANT
PIN GUIDE 3.2 903003004 (MISCELLANEOUS) ×2 IMPLANT
SCREW BONE CORTICAL 5.0X36 (Screw) ×2 IMPLANT
SCREW LAG HIP FR NAIL 10.5X110 (Orthopedic Implant) IMPLANT
STAPLER VISISTAT 35W (STAPLE) ×2 IMPLANT
SUT MNCRL AB 4-0 PS2 18 (SUTURE) ×3 IMPLANT
SUT VIC AB 1 CT1 27 (SUTURE) ×3
SUT VIC AB 1 CT1 27XBRD ANBCTR (SUTURE) ×1 IMPLANT
SUT VIC AB 2-0 CT1 27 (SUTURE) ×3
SUT VIC AB 2-0 CT1 TAPERPNT 27 (SUTURE) ×1 IMPLANT
TOWEL OR 17X24 6PK STRL BLUE (TOWEL DISPOSABLE) ×3 IMPLANT
TOWEL OR 17X26 10 PK STRL BLUE (TOWEL DISPOSABLE) ×3 IMPLANT
WATER STERILE IRR 1000ML POUR (IV SOLUTION) ×3 IMPLANT

## 2017-01-14 NOTE — ED Notes (Signed)
Patient transported to X-ray 

## 2017-01-14 NOTE — ED Notes (Signed)
Called lab to add on BNP. ?

## 2017-01-14 NOTE — Progress Notes (Signed)
PT Cancellation Note  Patient Details Name: Mallory Rodriguez MRN: [id number] DOB: 01-Apr-1933   Cancelled Treatment:    Reason Eval/Treat Not Completed: Medical issues which prohibited therapy.  Plan to go to OR later today for stabilization of R hip fx.  PT will await post op orders and likely will not evaluate until tomorrow.  Thanks,    Barbarann Ehlers. [place], [place], DPT [phone]   01/14/2017, 12:14 PM

## 2017-01-14 NOTE — Consult Note (Addendum)
Reason for Consult: right hip fracture Referring Physician:  Doyle Askew, MD  Mallory Rodriguez is an 83 y.o. female.  HPI: Chief Complaint: right hip pain after fall  HPI: Mallory Rodriguez is a 83 y.o. female with medical history significant of hypertension, hyperlipidemia, TIA, pernicious anemia, MVP, hypogammaglobulinemia, angina pectoralis, who presents with right hip pain.  Patient states that she fell when she tried to go to bed and fell over a dresser at about 10:00 PM, injured her right hip, causing right hip pain. Patient strongly denies head injury or neck injury. No LOC.The pain is constant, 10 out of 10 in severity, sharp, nonradiating. She is unable to bear weight. No leg numbness. Patient denies prodrome of symptoms, no unilateral weakness, dizziness, chest pain or shortness breath. Patient states that she has nausea and vomited once. No abdominal pain or diarrhea. Denies symptoms of UTI or unilateral weakness.   Ortho consulted for management of right hip fracture No other injuries reported  Past Medical History:  Diagnosis Date  . AP (angina pectoris) ([place])   . Claustrophobia   . Diverticulosis of colon   . Hiatal hernia   . HLD (hyperlipidemia)   . HTN (hypertension)   . Hypogammaglobulinemia ([place])    borderline  . MVP (mitral valve prolapse)    and tricuspid leak  . OA (osteoarthritis)   . Pelvic kidney    lower left side  . Pernicious anemia   . Sciatica   . TIA (transient ischemic attack) 8/16    Past Surgical History:  Procedure Laterality Date  . APPENDECTOMY    . basal cell carcinoma removal     Back  . BREAST BIOPSY Right    neg  . BREAST LUMPECTOMY     Right (fatty tumor)  . CHOLECYSTECTOMY    . DILATION AND CURETTAGE OF UTERUS    . excision vestibular glands    . Film removed Left eye    . INGUINAL HERNIA REPAIR     Left side  . INTRAOCULAR LENS INSERTION     Bilateral  . KNEE ARTHROSCOPY W/ PARTIAL MEDIAL MENISCECTOMY  08/2009   right  . Right ovary  and tube corpus    . TONSILLECTOMY    . TOTAL ABDOMINAL HYSTERECTOMY      Family History  Problem Relation Age of Onset  . Heart attack Father 60    CABG  . Hypertension Father   . Dementia Mother   . Cancer Maternal Aunt     Breast-multiple aunts and Gm  . Cancer Maternal Uncle     Bladder and lung  . Diabetes Paternal Grandmother   . Hypertension Brother     Social History:  reports that she has never smoked. She has never used smokeless tobacco. She reports that she drinks alcohol. She reports that she does not use drugs.  Allergies:  Allergies  Allergen Reactions  . Bee Venom Anaphylaxis  . Contrast Media [Iodinated Diagnostic Agents] Hives  . Lipitor [Atorvastatin] Swelling  . Ace Inhibitors Swelling  . Hydrocodone Other (See Comments)    Reaction:  Unknown   . Macrodantin Other (See Comments)    Reaction:  Chest pain   . Neosporin [Neomycin-Bacitracin Zn-Polymyx] Other (See Comments)    Reaction:  Burning   . Sulfa Antibiotics Hives  . Epinephrine Palpitations  . Prednisone Rash and Other (See Comments)    Reaction:  Dizziness and headache     Medications:  I have reviewed the patient's current medications. Scheduled: . acetaminophen  650 mg Oral TID  . acidophilus  1 capsule Oral Daily  . calcium-vitamin D  1 tablet Oral Daily  . [START ON 02/28/2017] cyanocobalamin  1,000 mcg Intramuscular Q90 days  . [START ON 01/15/2017] hydrochlorothiazide  25 mg Oral Daily  . omega-3 acid ethyl esters  1 g Oral Daily    Results for orders placed or performed during the hospital encounter of 01/13/17 (from the past 24 hour(s))  Basic metabolic panel     Status: Abnormal   Collection Time: 01/13/17 11:37 PM  Result Value Ref Range   Sodium 138 135 - 145 mmol/L   Potassium 3.4 (L) 3.5 - 5.1 mmol/L   Chloride 102 101 - 111 mmol/L   CO2 21 (L) 22 - 32 mmol/L   Glucose, Bld 152 (H) 65 - 99 mg/dL   BUN 27 (H) 6 - 20 mg/dL   Creatinine, Ser 0.97 0.44 - 1.00 mg/dL    Calcium 9.0 8.9 - 10.3 mg/dL   GFR calc non Af Amer 53 (L) >60 mL/min   GFR calc Af Amer >60 >60 mL/min   Anion gap 15 5 - 15  CBC WITH DIFFERENTIAL     Status: Abnormal   Collection Time: 01/13/17 11:37 PM  Result Value Ref Range   WBC 14.0 (H) 4.0 - 10.5 K/uL   RBC 3.86 (L) 3.87 - 5.11 MIL/uL   Hemoglobin 12.0 12.0 - 15.0 g/dL   HCT 36.3 36.0 - 46.0 %   MCV 94.0 78.0 - 100.0 fL   MCH 31.1 26.0 - 34.0 pg   MCHC 33.1 30.0 - 36.0 g/dL   RDW 14.6 11.5 - 15.5 %   Platelets 211 150 - 400 K/uL   Neutrophils Relative % 86 %   Neutro Abs 11.9 (H) 1.7 - 7.7 K/uL   Lymphocytes Relative 8 %   Lymphs Abs 1.2 0.7 - 4.0 K/uL   Monocytes Relative 6 %   Monocytes Absolute 0.9 0.1 - 1.0 K/uL   Eosinophils Relative 0 %   Eosinophils Absolute 0.0 0.0 - 0.7 K/uL   Basophils Relative 0 %   Basophils Absolute 0.0 0.0 - 0.1 K/uL  Protime-INR     Status: None   Collection Time: 01/13/17 11:37 PM  Result Value Ref Range   Prothrombin Time 12.8 11.4 - 15.2 seconds   INR 0.96   Brain natriuretic peptide     Status: None   Collection Time: 01/13/17 11:37 PM  Result Value Ref Range   B Natriuretic Peptide 37.3 0.0 - 100.0 pg/mL  Type and screen [place]     Status: None   Collection Time: 01/13/17 11:46 PM  Result Value Ref Range   ABO/RH(D) A POS    Antibody Screen NEG    Sample Expiration 01/16/2017   ABO/Rh     Status: None   Collection Time: 01/13/17 11:46 PM  Result Value Ref Range   ABO/RH(D) A POS   Surgical pcr screen     Status: None   Collection Time: 01/14/17  3:23 AM  Result Value Ref Range   MRSA, PCR NEGATIVE NEGATIVE   Staphylococcus aureus NEGATIVE NEGATIVE    X-ray: CLINICAL DATA:  Post fall with right hip pain.  EXAM: DG HIP (WITH OR WITHOUT PELVIS) 2-3V RIGHT  COMPARISON:  None.  FINDINGS: Comminuted displaced intertrochanteric right hip fracture involving the lesser and greater trochanters. There proximal migration of the femoral shaft.  Femoral head remains seated in the acetabulum. No additional acute fracture.  Pubic symphysis and sacroiliac joints are congruent. Pubic rami appear intact.  IMPRESSION: Comminuted displaced intertrochanteric right hip fracture.   Electronically Signed   By: Jeb Levering M.D  ROS  General: no fevers, chills, no changes in body weight, has fatigue HEENT: no blurry vision, hearing changes or sore throat Respiratory: no dyspnea, coughing, wheezing CV: no chest pain, no palpitations GI: has nausea, vomiting, no abdominal pain, diarrhea, constipation GU: no dysuria, burning on urination, increased urinary frequency, hematuria  Ext: no leg edema Neuro: no unilateral weakness, numbness, or tingling, no vision change or hearing loss Skin: no rash, no skin tear. MSK: has right hip pain Heme: No easy bruising.  Travel history: No recent long distant travel.  Blood pressure (!) 122/56, pulse 94, temperature 98.6 F (37 C), temperature source Oral, resp. rate 16, height 5\' 1"  (1.549 m), weight 72.6 kg (160 lb), SpO2 95 %.  Physical Exam  General: Not in acute distress HEENT:       Eyes: PERRL, EOMI, no scleral icterus.       ENT: No discharge from the ears and nose, no pharynx injection, no tonsillar enlargement.        Neck: No JVD, no bruit, no mass felt.  Cardiac: S1/S2, RRR, No murmurs, No gallops or rubs. Respiratory: No rales, wheezing, rhonchi or rubs. GI: Soft, nondistended, nontender, no rebound pain, no organomegaly, BS present. Ext: No pitting leg edema bilaterally. 2+DP/PT pulse bilaterally. Musculoskeletal:  Right lower extremity is shortened and rotated, tenderness to palpation to right hip and right knee Skin: No rashes.  Neuro: Alert, oriented X3, cranial nerves II-XII grossly intact, moves all extremities normally. Psych: Patient is not psychotic, no suicidal or hemocidal ideation.  Assessment/Plan: Right hip intertrochanteric fracture  NPO Plan to go to OR  today if cleared Will order consent  Darrold Bezek D 01/14/2017, 7:34 AM

## 2017-01-14 NOTE — Anesthesia Postprocedure Evaluation (Signed)
Anesthesia Post Note  Patient: Mallory Rodriguez  Procedure(s) Performed: Procedure(s) (LRB): ORIF RIGHT INTERTROCH NAIL (Right)  Patient location during evaluation: PACU Anesthesia Type: General Level of consciousness: sedated Pain management: pain level controlled Vital Signs Assessment: post-procedure vital signs reviewed and stable Respiratory status: spontaneous breathing and respiratory function stable Cardiovascular status: stable Anesthetic complications: no       Last Vitals:  Vitals:   01/14/17 1710 01/14/17 1725  BP: (!) 109/57 (!) 93/48  Pulse: 88 89  Resp: 20 20  Temp:      Last Pain:  Vitals:   01/14/17 1725  TempSrc:   PainSc: Asleep                 Dashae Wilcher DANIEL

## 2017-01-14 NOTE — Progress Notes (Signed)
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 Surgical consent signed and in chart.

## 2017-01-14 NOTE — Progress Notes (Signed)
OT Cancellation Note  Patient Details Name: Mallory Rodriguez MRN: [id number] DOB: Sep 16, 1933   Cancelled Treatment:    Reason Eval/Treat Not Completed: Medical issues which prohibited therapy (Plan to go to OR later today for stabilization of R hip fx.  OT will await post op orders and likely will not evaluate until tomorrow.)  [name] , MS, OTR/L, CLT Pager: [phone] 01/14/2017, 12:43 PM

## 2017-01-14 NOTE — Anesthesia Preprocedure Evaluation (Addendum)
Anesthesia Evaluation  Patient identified by MRN, date of birth, ID band Patient awake    Reviewed: Allergy & Precautions, NPO status , Patient's Chart, lab work & pertinent test results  Airway Mallampati: II  TM Distance: >3 FB Neck ROM: Full    Dental no notable dental hx. (+) Dental Advisory Given   Pulmonary neg pulmonary ROS,    Pulmonary exam normal        Cardiovascular hypertension, Normal cardiovascular exam  Study Conclusions  - Left ventricle: The cavity size was normal. Systolic function was   normal. The estimated ejection fraction was in the range of 50%   to 55%. Septal motion consistent with bundle branch block.   Doppler parameters are consistent with abnormal left ventricular   relaxation (grade 1 diastolic dysfunction). - Aortic valve: There was trivial regurgitation. - Mitral valve: There was mild regurgitation. - Left atrium: The atrium was normal in size. - Right ventricle: Systolic function was normal. - Pulmonary arteries: Systolic pressure was within the normal   range.   Neuro/Psych PSYCHIATRIC DISORDERS Anxiety TIA   GI/Hepatic Neg liver ROS, hiatal hernia,   Endo/Other  negative endocrine ROS  Renal/GU negative Renal ROS  negative genitourinary   Musculoskeletal   Abdominal   Peds negative pediatric ROS (+)  Hematology negative hematology ROS (+)   Anesthesia Other Findings   Reproductive/Obstetrics negative OB ROS                            Anesthesia Physical Anesthesia Plan  ASA: III  Anesthesia Plan: General   Post-op Pain Management:    Induction: Intravenous  Airway Management Planned: Oral ETT  Additional Equipment:   Intra-op Plan:   Post-operative Plan: Extubation in OR  Informed Consent: I have reviewed the patients History and Physical, chart, labs and discussed the procedure including the risks, benefits and alternatives for the  proposed anesthesia with the patient or authorized representative who has indicated his/her understanding and acceptance.   Dental advisory given  Plan Discussed with: CRNA, Anesthesiologist and Surgeon  Anesthesia Plan Comments:         Anesthesia Quick Evaluation

## 2017-01-14 NOTE — Anesthesia Procedure Notes (Signed)
Procedure Name: Intubation Date/Time: 01/14/2017 3:39 PM Performed by: Eligha Bridegroom Pre-anesthesia Checklist: Patient identified, Emergency Drugs available, Suction available, Patient being monitored and Timeout performed Patient Re-evaluated:Patient Re-evaluated prior to inductionOxygen Delivery Method: Circle system utilized Preoxygenation: Pre-oxygenation with 100% oxygen Intubation Type: IV induction Laryngoscope Size: Mac and 3 Grade View: Grade III Tube type: Oral Tube size: 7.0 mm Number of attempts: 1 Airway Equipment and Method: Bougie stylet Placement Confirmation: ETT inserted through vocal cords under direct vision,  positive ETCO2 and breath sounds checked- equal and bilateral Secured at: 21 cm Tube secured with: Tape Dental Injury: Teeth and Oropharynx as per pre-operative assessment

## 2017-01-14 NOTE — ED Notes (Signed)
Attempted to call report to [address] x 1.

## 2017-01-14 NOTE — Brief Op Note (Signed)
01/13/2017 - 01/14/2017  3:20 PM  PATIENT:  Mallory Rodriguez  83 y.o. female  PRE-OPERATIVE DIAGNOSIS:  Right Comminuted Intertrochanteric proximal femur Fracture  POST-OPERATIVE DIAGNOSIS:  Right Comminuted Intertrochanteric proximal femur Fracture  PROCEDURE:  Procedure(s): ORIF RIGHT INTERTROCH NAIL (Right)  SURGEON:  Surgeon(s) and Role:    * Paralee Cancel, MD - Primary  PHYSICIAN ASSISTANT: None  ASSISTANTS: Surgical team  ANESTHESIA:   general  EBL:  No intake/output data recorded.  BLOOD ADMINISTERED:none  DRAINS: none   LOCAL MEDICATIONS USED:  NONE  SPECIMEN:  No Specimen  DISPOSITION OF SPECIMEN:  N/A  COUNTS:  YES  TOURNIQUET:  * No tourniquets in log *  DICTATION: .Other Dictation: Dictation Number [id number]  PLAN OF CARE: Admit to inpatient   PATIENT DISPOSITION:  PACU - hemodynamically stable.   Delay start of Pharmacological VTE agent (>24hrs) due to surgical blood loss or risk of bleeding: no

## 2017-01-14 NOTE — H&P (Signed)
History and Physical    Mallory Rodriguez [id number] DOB: [birth year] DOA: 01/13/2017  Referring MD/NP/PA:   PCP: [name], MD   Patient coming from:  The patient is coming from home.  At baseline, pt is independent for most of ADL.   Chief Complaint: right hip pain after fall  HPI: Mallory Rodriguez is a 83 y.o. female with medical history significant of hypertension, hyperlipidemia, TIA, pernicious anemia, MVP, hypogammaglobulinemia, angina pectoralis, who presents with right hip pain.  Patient states that she fell when she tried to go to bed and tripped her steps at about 10:00 PM, injured her right hip, causing right hip pain. Patient strongly denies head injury or neck injury. No LOC.The pain is constant, 10 out of 10 in severity, sharp, nonradiating. She is unable to bear weight. No leg numbness. Patient denies prodrome of symptoms, no unilateral weakness, dizziness, chest pain or shortness breath. Patient states that she has nausea and vomited once. No abdominal pain or diarrhea. Denies symptoms of UTI or unilateral weakness.   ED Course: pt was found to have WBC 14.0, INR 0.96, potassium 3.4, creatinine normal, temperature normal, no tachycardia, oxygen saturation 94% on room air. Chest x-ray is negative for infiltration. X-ray of right knee is negative. X-ray of the right hip showed subsegmental left basilar atelectasis, otherwise no acute abnormality. Pt is admitted to med-surg bed as inpt. Ortho, Dr. Alvan Dame was consulted by EDP  Review of Systems:   General: no fevers, chills, no changes in body weight, has fatigue HEENT: no blurry vision, hearing changes or sore throat Respiratory: no dyspnea, coughing, wheezing CV: no chest pain, no palpitations GI: has nausea, vomiting, no abdominal pain, diarrhea, constipation GU: no dysuria, burning on urination, increased urinary frequency, hematuria  Ext: no leg edema Neuro: no unilateral weakness, numbness, or tingling, no vision change or  hearing loss Skin: no rash, no skin tear. MSK: has right hip pain Heme: No easy bruising.  Travel history: No recent long distant travel.  Allergy:  Allergies  Allergen Reactions  . Bee Venom Anaphylaxis  . Contrast Media [Iodinated Diagnostic Agents] Hives  . Lipitor [Atorvastatin] Swelling  . Ace Inhibitors Swelling  . Hydrocodone Other (See Comments)    Reaction:  Unknown   . Macrodantin Other (See Comments)    Reaction:  Chest pain   . Neosporin [Neomycin-Bacitracin Zn-Polymyx] Other (See Comments)    Reaction:  Burning   . Sulfa Antibiotics Hives  . Epinephrine Palpitations  . Prednisone Rash and Other (See Comments)    Reaction:  Dizziness and headache     Past Medical History:  Diagnosis Date  . AP (angina pectoris) ([place])   . Claustrophobia   . Diverticulosis of colon   . Hiatal hernia   . HLD (hyperlipidemia)   . HTN (hypertension)   . Hypogammaglobulinemia ([place])    borderline  . MVP (mitral valve prolapse)    and tricuspid leak  . OA (osteoarthritis)   . Pelvic kidney    lower left side  . Pernicious anemia   . Sciatica   . TIA (transient ischemic attack) 8/16    Past Surgical History:  Procedure Laterality Date  . APPENDECTOMY    . basal cell carcinoma removal     Back  . BREAST BIOPSY Right    neg  . BREAST LUMPECTOMY     Right (fatty tumor)  . CHOLECYSTECTOMY    . DILATION AND CURETTAGE OF UTERUS    . excision vestibular glands    .  Film removed Left eye    . INGUINAL HERNIA REPAIR     Left side  . INTRAOCULAR LENS INSERTION     Bilateral  . KNEE ARTHROSCOPY W/ PARTIAL MEDIAL MENISCECTOMY  08/2009   right  . Right ovary and tube corpus    . TONSILLECTOMY    . TOTAL ABDOMINAL HYSTERECTOMY      Social History:  reports that she has never smoked. She has never used smokeless tobacco. She reports that she drinks alcohol. She reports that she does not use drugs.  Family History:  Family History  Problem Relation Age of Onset  . Heart  attack Father 58    CABG  . Hypertension Father   . Dementia Mother   . Cancer Maternal Aunt     Breast-multiple aunts and Gm  . Cancer Maternal Uncle     Bladder and lung  . Diabetes Paternal Grandmother   . Hypertension Brother      Prior to Admission medications   Medication Sig Start Date End Date Taking? Authorizing Provider  acetaminophen (TYLENOL) 650 MG CR tablet Take 650 mg by mouth 3 (three) times daily.   Yes Historical Provider, MD  amoxicillin (AMOXIL) 500 MG capsule TAKE 4 CAPSULES BY MOUTH 1 HOUR PRIOR TO DENTAL APPT 02/17/16  Yes Historical Provider, MD  aspirin 81 MG tablet Take 81 mg by mouth daily.   Yes Historical Provider, MD  CALCIUM-VITAMIN D-VITAMIN K PO Take 1 each by mouth daily.   Yes Historical Provider, MD  clopidogrel (PLAVIX) 75 MG tablet TAKE 1 TABLET BY MOUTH EVERY DAY 10/11/16  Yes Venia Carbon, MD  cyanocobalamin (,VITAMIN B-12,) 1000 MCG/ML injection Inject 1,000 mcg into the muscle every 3 (three) months.   Yes Historical Provider, MD  EPINEPHrine (EPIPEN JR) 0.15 MG/0.3ML injection INJECT ONE SYRINGEFUL INTRAMUSCULARLY AS NEEDED FOR  ANAPHYLAXIS 04/27/16  Yes Venia Carbon, MD  hydrochlorothiazide (HYDRODIURIL) 25 MG tablet TAKE ONE TABLET BY MOUTH DAILY. 11/01/16  Yes Venia Carbon, MD  lovastatin (MEVACOR) 40 MG tablet Take 1 tablet (40 mg total) by mouth at bedtime. 12/18/16  Yes Venia Carbon, MD  nitroGLYCERIN (NITROLINGUAL) 0.4 MG/SPRAY spray PLACE ONE SPRAY UNDER THE TONGUE EVERY FIVE MINUTES AS NEEDED FOR CHEST PAIN 07/06/16  Yes Venia Carbon, MD  Omega-3 Fatty Acids (FISH OIL) 1200 MG CAPS Take 1 capsule by mouth daily.   Yes Historical Provider, MD  Polyethyl Glycol-Propyl Glycol (SYSTANE OP) Apply 1 drop to eye as needed (for dry eyes).   Yes Historical Provider, MD  Probiotic Product (ALIGN) 4 MG CAPS Take 1 capsule by mouth daily.   Yes Historical Provider, MD    Physical Exam: Vitals:   01/13/17 2330 01/14/17 0000 01/14/17  0156 01/14/17 0200  BP: 151/69 143/65 124/59 (!) 123/53  Pulse: 81 80 90 89  Resp: 23 20 18    Temp:      TempSrc:      SpO2: 94% 98% 94% 92%  Weight:      Height:       General: Not in acute distress HEENT:       Eyes: PERRL, EOMI, no scleral icterus.       ENT: No discharge from the ears and nose, no pharynx injection, no tonsillar enlargement.        Neck: No JVD, no bruit, no mass felt. Heme: No neck lymph node enlargement. Cardiac: S1/S2, RRR, No murmurs, No gallops or rubs. Respiratory: No rales, wheezing, rhonchi or rubs.  GI: Soft, nondistended, nontender, no rebound pain, no organomegaly, BS present. GU: No hematuria Ext: No pitting leg edema bilaterally. 2+DP/PT pulse bilaterally. Musculoskeletal:  Right lower extremity is shortened and rotated, tenderness to palpation to right hip and right knee Skin: No rashes.  Neuro: Alert, oriented X3, cranial nerves II-XII grossly intact, moves all extremities normally. Psych: Patient is not psychotic, no suicidal or hemocidal ideation.  Labs on Admission: I have personally reviewed following labs and imaging studies  CBC:  Recent Labs Lab 01/13/17 2337  WBC 14.0*  NEUTROABS 11.9*  HGB 12.0  HCT 36.3  MCV 94.0  PLT 123456   Basic Metabolic Panel:  Recent Labs Lab 01/13/17 2337  NA 138  K 3.4*  CL 102  CO2 21*  GLUCOSE 152*  BUN 27*  CREATININE 0.97  CALCIUM 9.0   GFR: Estimated Creatinine Clearance: 41.4 mL/min (by C-G formula based on SCr of 0.97 mg/dL). Liver Function Tests: No results for input(s): AST, ALT, ALKPHOS, BILITOT, PROT, ALBUMIN in the last 168 hours. No results for input(s): LIPASE, AMYLASE in the last 168 hours. No results for input(s): AMMONIA in the last 168 hours. Coagulation Profile:  Recent Labs Lab 01/13/17 2337  INR 0.96   Cardiac Enzymes: No results for input(s): CKTOTAL, CKMB, CKMBINDEX, TROPONINI in the last 168 hours. BNP (last 3 results) No results for input(s): PROBNP in the  last 8760 hours. HbA1C: No results for input(s): HGBA1C in the last 72 hours. CBG: No results for input(s): GLUCAP in the last 168 hours. Lipid Profile: No results for input(s): CHOL, HDL, LDLCALC, TRIG, CHOLHDL, LDLDIRECT in the last 72 hours. Thyroid Function Tests: No results for input(s): TSH, T4TOTAL, FREET4, T3FREE, THYROIDAB in the last 72 hours. Anemia Panel: No results for input(s): VITAMINB12, FOLATE, FERRITIN, TIBC, IRON, RETICCTPCT in the last 72 hours. Urine analysis:    Component Value Date/Time   COLORURINE YELLOW (A) 06/11/2016 1409   APPEARANCEUR CLEAR (A) 06/11/2016 1409   APPEARANCEUR Cloudy 12/25/2014 2355   LABSPEC 1.018 06/11/2016 1409   LABSPEC 1.027 12/25/2014 2355   PHURINE 5.0 06/11/2016 1409   GLUCOSEU NEGATIVE 06/11/2016 1409   GLUCOSEU Negative 12/25/2014 2355   HGBUR NEGATIVE 06/11/2016 1409   HGBUR Mod , non-hem 02/22/2011 1051   BILIRUBINUR NEGATIVE 06/11/2016 1409   BILIRUBINUR negative 05/21/2016 1208   BILIRUBINUR Negative 12/25/2014 2355   KETONESUR TRACE (A) 06/11/2016 1409   PROTEINUR NEGATIVE 06/11/2016 1409   UROBILINOGEN >=8.0 05/21/2016 1208   UROBILINOGEN 0.2 02/22/2011 1051   NITRITE NEGATIVE 06/11/2016 1409   LEUKOCYTESUR NEGATIVE 06/11/2016 1409   LEUKOCYTESUR Trace 12/25/2014 2355   Sepsis Labs: @LABRCNTIP (procalcitonin:4,lacticidven:4) )No results found for this or any previous visit (from the past 240 hour(s)).   Radiological Exams on Admission: Dg Chest 1 View  Result Date: 01/14/2017 CLINICAL DATA:  Fall with right hip pain. EXAM: CHEST 1 VIEW COMPARISON:  Chest CT 02/22/2015 FINDINGS: The cardiomediastinal contours are normal. The heart is at the upper limits of normal in size. Subsegmental atelectasis or scarring at the left lung base. Pulmonary vasculature is normal. No consolidation, pleural effusion, or pneumothorax. No acute osseous abnormalities are seen. IMPRESSION: Subsegmental left basilar atelectasis. Otherwise no  acute abnormality. Electronically Signed   By: Jeb Levering M.D.   On: 01/14/2017 00:36   Dg Knee Complete 4 Views Right  Result Date: 01/14/2017 CLINICAL DATA:  Fall with right hip and knee pain. EXAM: RIGHT KNEE - COMPLETE 4+ VIEW COMPARISON:  None. FINDINGS: No acute fracture or  subluxation. Tricompartmental osteoarthritis with peripheral spurring. Spurring of the tibial spines. There is narrowing of the lateral tibiofemoral joint space. Trace joint effusion. Quadriceps tendon enthesopathy. IMPRESSION: Moderate tricompartmental osteoarthritis without acute fracture or subluxation. Electronically Signed   By: Jeb Levering M.D.   On: 01/14/2017 00:37   Dg Hip Unilat With Pelvis 2-3 Views Right  Result Date: 01/14/2017 CLINICAL DATA:  Post fall with right hip pain. EXAM: DG HIP (WITH OR WITHOUT PELVIS) 2-3V RIGHT COMPARISON:  None. FINDINGS: Comminuted displaced intertrochanteric right hip fracture involving the lesser and greater trochanters. There proximal migration of the femoral shaft. Femoral head remains seated in the acetabulum. No additional acute fracture. Pubic symphysis and sacroiliac joints are congruent. Pubic rami appear intact. IMPRESSION: Comminuted displaced intertrochanteric right hip fracture. Electronically Signed   By: Jeb Levering M.D.   On: 01/14/2017 00:38     EKG: Independently reviewed.  Sinus rhythm, QTC 481, LAD, LAE, anteroseptal infarction pattern   Assessment/Plan Principal Problem:   Closed right hip fracture, initial encounter [facility]) Active Problems:   Pernicious anemia   HYPERTENSION, BENIGN   CVA (cerebral vascular accident) ([place])   Hypokalemia   Closed right hip fracture, initial encounter [facility]):  As evidenced by x-ray. Patient has moderate pain now. No neurovascular compromise. Orthopedic surgeon was consulted. Dr. Alvan Dame will see pt. Pt has low to moderate risk. Pt would like to do the surgery. Patient has no history of diastolic congestive  heart failure. No respiratory distress, no chest pain or shortness rest. I think the benefit would outweigh the risk in terms of quality of life.   - will admit to Med-surg bed - Pain control: morphine prn and percocet - When necessary Zofran for nausea - Robaxin for muscle spasm - type and cross - INR/PTT  Leukocytosis: WBC 14. Likely due to stress-induced demargination. Patient does not have signs of infection. -Follow-up CBC  Pernicious anemia: Hgb stable, 12. -f/u by CBC  HTN: -continue HCTZ  Hx of CVA: -continue ASA and plavix  Hypokalemia: K= 3.4 on admission. - Repleted - Check Mg level  DVT ppx: SCD Code Status: Full code Family Communication: None at bed side.      Disposition Plan:  Anticipate discharge back to previous home environment Consults called:  Ortho, Dr. Alvan Dame was consulted by EDP Admission status:  medical floor/obs   Date of Service 01/14/2017    Ivor Costa [facility] Pager [phone]  If 7PM-7AM, please contact night-coverage [email or web address] Password [email or web address] 01/14/2017, 2:13 AM

## 2017-01-14 NOTE — Progress Notes (Signed)
Pt admitted after midnight, please see earlier admission note by Dr Blaine Hamper. Dr. Blaine Hamper has determined that pt has low to moderate risk. Pt would like to do the surgery.Patient has no history of diastolic congestive heart failure. No respiratory distress, no chest pain or shortness of breath, benefit would outweigh the risk in terms of quality of life. Currently VSS, only mild leukocytosis but this is likely reactive, pt with no fevers and no specific concern that would indicate an infectious etiology. Will give one dose of K-dur and K is 3.4 this AM. Will repeat BMP and CBC in AM.  Faye Ramsay, MD  [facility] Pager [phone] Cell [phone]  If 7PM-7AM, please contact night-coverage [email or web address] Password [email or web address]

## 2017-01-14 NOTE — Transfer of Care (Signed)
Immediate Anesthesia Transfer of Care Note  Patient: Mallory Rodriguez  Procedure(s) Performed: Procedure(s): ORIF RIGHT INTERTROCH NAIL (Right)  Patient Location: PACU  Anesthesia Type:General  Level of Consciousness: awake and patient cooperative  Airway & Oxygen Therapy: Patient Spontanous Breathing and Patient connected to nasal cannula oxygen  Post-op Assessment: Report given to RN and Post -op Vital signs reviewed and stable  Post vital signs: Reviewed and stable  Last Vitals:  Vitals:   01/14/17 0603 01/14/17 1655  BP: (!) 122/56 (!) 113/48  Pulse: 94 (!) 102  Resp: 16 19  Temp: 37 C 36.8 C    Last Pain:  Vitals:   01/14/17 1000  TempSrc:   PainSc: 4          Complications: No apparent anesthesia complications

## 2017-01-14 NOTE — Progress Notes (Signed)
Initial Nutrition Assessment  DOCUMENTATION CODES:   Obesity unspecified  INTERVENTION:  Once diet advances, provide Ensure Enlive po BID, each supplement provides 350 kcal and 20 grams of protein.  NUTRITION DIAGNOSIS:   Increased nutrient needs related to  (post op healing) as evidenced by estimated needs.  GOAL:   Patient will meet greater than or equal to 90% of their needs  MONITOR:   Supplement acceptance, Diet advancement, Labs, Weight trends, Skin, I & O's  REASON FOR ASSESSMENT:   Consult Assessment of nutrition requirement/status  ASSESSMENT:   83 y.o. female with medical history significant of hypertension, hyperlipidemia, TIA, pernicious anemia, MVP, hypogammaglobulinemia, angina pectoralis, who presents with right hip pain after fall.   Pt is currently NPO for surgery today. Pt reports having a lack of appetite currently due to pain. Pt reports eating well however with usual consumption of at least 3 meals a day with no other difficulties. Weight has been stable. Pt is agreeable to nutritional supplements to aid in post op healing. RD to order. Unable to complete Nutrition-Focused physical exam at this time. Pt was in pain during time of visit and thus did not want her body to be moved or touched. RD to perform physical exam at next visit.   Labs and medications reviewed.   Diet Order:  Diet NPO time specified Except for: Ice Chips, Sips with Meds Diet NPO time specified Except for: Sips with Meds  Skin:  Reviewed, no issues  Last BM:  1/14  Height:   Ht Readings from Last 1 Encounters:  01/13/17 5\' 1"  (1.549 m)    Weight:   Wt Readings from Last 1 Encounters:  01/13/17 160 lb (72.6 kg)    Ideal Body Weight:  47.7 kg  BMI:  Body mass index is 30.23 kg/m.  Estimated Nutritional Needs:   Kcal:  1650-1850  Protein:  70-80 grams  Fluid:  1.6-1.8 L/day  EDUCATION NEEDS:   No education needs identified at this time  Corrin Parker, MS, RD,  LDN Pager # [phone] After hours/ weekend pager # [phone]

## 2017-01-15 ENCOUNTER — Encounter (HOSPITAL_COMMUNITY): Payer: Self-pay | Admitting: Orthopedic Surgery

## 2017-01-15 ENCOUNTER — Inpatient Hospital Stay (HOSPITAL_COMMUNITY): Payer: Medicare Other

## 2017-01-15 LAB — BASIC METABOLIC PANEL
ANION GAP: 9 (ref 5–15)
BUN: 26 mg/dL — ABNORMAL HIGH (ref 6–20)
CALCIUM: 8 mg/dL — AB (ref 8.9–10.3)
CO2: 25 mmol/L (ref 22–32)
Chloride: 103 mmol/L (ref 101–111)
Creatinine, Ser: 1.1 mg/dL — ABNORMAL HIGH (ref 0.44–1.00)
GFR calc Af Amer: 53 mL/min — ABNORMAL LOW (ref >60)
GFR calc non Af Amer: 46 mL/min — ABNORMAL LOW (ref >60)
GLUCOSE: 178 mg/dL — AB (ref 65–99)
POTASSIUM: 4.1 mmol/L (ref 3.5–5.1)
Sodium: 137 mmol/L (ref 135–145)

## 2017-01-15 LAB — CBC
HEMATOCRIT: 22.6 % — AB (ref 36.0–46.0)
Hemoglobin: 7.5 g/dL — ABNORMAL LOW (ref 12.0–15.0)
MCH: 31.1 pg (ref 26.0–34.0)
MCHC: 33.2 g/dL (ref 30.0–36.0)
MCV: 93.8 fL (ref 78.0–100.0)
Platelets: 148 10*3/uL — ABNORMAL LOW (ref 150–400)
RBC: 2.41 MIL/uL — ABNORMAL LOW (ref 3.87–5.11)
RDW: 15.1 % (ref 11.5–15.5)
WBC: 8.9 10*3/uL (ref 4.0–10.5)

## 2017-01-15 MED ORDER — POLYETHYLENE GLYCOL 3350 17 G PO PACK: 17.0000 g | PACK | Freq: Every day | ORAL | 0 refills | Status: AC | PRN

## 2017-01-15 MED ORDER — OXYCODONE HCL 5 MG PO TABS: 5.0000 mg | ORAL_TABLET | Freq: Four times a day (QID) | ORAL | 0 refills | Status: DC | PRN

## 2017-01-15 MED ORDER — SODIUM CHLORIDE 0.9 % IV SOLN: Freq: Once | INTRAVENOUS | Status: DC

## 2017-01-15 NOTE — NC FL2 (Signed)
[place] LEVEL OF CARE SCREENING TOOL     IDENTIFICATION  Patient Name: Mallory Rodriguez Birthdate: [birth year] Sex: female Admission Date (Current Location): 01/13/2017  [facility] and [place] Number:  Herbalist and Address:  [facility] [place]. [facility], [place] [address] [place], [place] [address]      Provider Number: [id number]  Attending Physician Name and Address:  Theodis Blaze, MD  Relative Name and Phone Number:       Current Level of Care: Hospital Recommended Level of Care: [place] Prior Approval Number:    Date Approved/Denied: 01/15/17 PASRR Number: [id number]  Discharge Plan: SNF    Current Diagnoses: Patient Active Problem List   Diagnosis Date Noted  . Closed right hip fracture, initial encounter ([place]) 01/14/2017  . Hypokalemia 01/14/2017  . Advance directive discussed with patient 12/13/2015  . CVA (cerebral vascular accident) ([place]) 08/31/2015  . Hemiplegia of nondominant side, late effect of cerebrovascular disease ([place]) 08/17/2015  . Tremor 11/30/2014  . Allergic rhinitis due to pollen 05/18/2013  . Routine general medical examination at a health care facility 11/18/2012  . Osteoarthritis, multiple sites 11/07/2010  . Hyperlipemia 11/02/2010  . Pernicious anemia 11/02/2010  . Mood disorder ([place]) 02/09/2010  . HYPERTENSION, BENIGN 02/09/2010  . Mitral valve disorder 02/09/2010    Orientation RESPIRATION BLADDER Height & Weight     Self, Situation  Normal Incontinent Weight: 160 lb (72.6 kg) Height:  5\' 1"  (154.9 cm)  BEHAVIORAL SYMPTOMS/MOOD NEUROLOGICAL BOWEL NUTRITION STATUS      Continent  (Please see d/c summary)  AMBULATORY STATUS COMMUNICATION OF NEEDS Skin     Verbally Surgical wounds (Closed incision right hip; adhesive bandage)                       Personal Care Assistance Level of Assistance  Bathing, Feeding, Dressing  Max Assist +2         Functional Limitations Info             SPECIAL CARE FACTORS FREQUENCY  PT (By licensed PT), OT (By licensed OT)     PT Frequency: 3x week OT Frequency: 3x week            Contractures Contractures Info: Not present    Additional Factors Info  Code Status, Allergies Code Status Info: Full Code Allergies Info: Bee Venom, Contrast Media Iodinated Diagnostic Agents, Lipitor Atorvastatin, Ace Inhibitors, Hydrocodone, Macrodantin, Neosporin Neomycin-bacitracin Zn-polymyx, Sulfa Antibiotics, Epinephrine, Prednisone           Current Medications (01/15/2017):  This is the current hospital active medication list Current Facility-Administered Medications  Medication Dose Route Frequency Provider Last Rate Last Dose  . 0.9 %  sodium chloride infusion   Intravenous Continuous Paralee Cancel, MD 50 mL/hr at 01/14/17 1829    . acetaminophen (TYLENOL) tablet 650 mg  650 mg Oral Q6H PRN Paralee Cancel, MD       Or  . acetaminophen (TYLENOL) suppository 650 mg  650 mg Rectal Q6H PRN Paralee Cancel, MD      . acetaminophen (TYLENOL) tablet 650 mg  650 mg Oral TID Ivor Costa, MD   650 mg at 01/15/17 0844  . acidophilus (RISAQUAD) capsule 1 capsule  1 capsule Oral Daily Ivor Costa, MD   1 capsule at 01/15/17 0907  . alum & mag hydroxide-simeth (MAALOX/MYLANTA) 200-200-20 MG/5ML suspension 30 mL  30 mL Oral Q4H PRN Paralee Cancel, MD      .  aspirin EC tablet 325 mg  325 mg Oral Q breakfast Paralee Cancel, MD   325 mg at 01/15/17 0845  . calcium-vitamin D (OSCAL WITH D) 500-200 MG-UNIT per tablet 1 tablet  1 tablet Oral Daily Ivor Costa, MD   1 tablet at 01/15/17 0845  . [START ON 02/28/2017] cyanocobalamin ((VITAMIN B-12)) injection 1,000 mcg  1,000 mcg Intramuscular Q90 days Ivor Costa, MD      . docusate sodium (COLACE) capsule 100 mg  100 mg Oral BID PRN Ivor Costa, MD      . docusate sodium (COLACE) capsule 100 mg  100 mg Oral BID Paralee Cancel, MD   100 mg at 01/15/17 0845  . feeding supplement (ENSURE ENLIVE) (ENSURE ENLIVE) liquid 237 mL   237 mL Oral BID BM Theodis Blaze, MD   237 mL at 01/15/17 0908  . ferrous sulfate tablet 325 mg  325 mg Oral BID WC Paralee Cancel, MD   325 mg at 01/15/17 0844  . hydrochlorothiazide (HYDRODIURIL) tablet 25 mg  25 mg Oral Daily Ivor Costa, MD   25 mg at 01/15/17 0844  . HYDROmorphone (DILAUDID) injection 0.1-0.5 mg  0.1-0.5 mg Intravenous Q3H PRN Paralee Cancel, MD      . lactated ringers infusion   Intravenous Continuous Duane Boston, MD 10 mL/hr at 01/14/17 1351 10 mL/hr at 01/14/17 1351  . menthol-cetylpyridinium (CEPACOL) lozenge 3 mg  1 lozenge Oral PRN Paralee Cancel, MD       Or  . phenol (CHLORASEPTIC) mouth spray 1 spray  1 spray Mouth/Throat PRN Paralee Cancel, MD      . methocarbamol (ROBAXIN) tablet 500 mg  500 mg Oral Q6H PRN Paralee Cancel, MD   500 mg at 01/15/17 0558   Or  . methocarbamol (ROBAXIN) 500 mg in dextrose 5 % 50 mL IVPB  500 mg Intravenous Q6H PRN Paralee Cancel, MD      . metoCLOPramide (REGLAN) tablet 5-10 mg  5-10 mg Oral Q8H PRN Paralee Cancel, MD       Or  . metoCLOPramide (REGLAN) injection 5-10 mg  5-10 mg Intravenous Q8H PRN Paralee Cancel, MD      . nitroGLYCERIN (NITROLINGUAL) 0.4 MG/SPRAY spray 1 spray  1 spray Sublingual Q5 min PRN Ivor Costa, MD      . omega-3 acid ethyl esters (LOVAZA) capsule 1 g  1 g Oral Daily Ivor Costa, MD   1 g at 01/15/17 0844  . ondansetron (ZOFRAN) tablet 4 mg  4 mg Oral Q6H PRN Paralee Cancel, MD       Or  . ondansetron [facility]) injection 4 mg  4 mg Intravenous Q6H PRN Paralee Cancel, MD      . oxyCODONE (Oxy IR/ROXICODONE) immediate release tablet 5-10 mg  5-10 mg Oral Q4H PRN Paralee Cancel, MD   5 mg at 01/15/17 0558  . polyethylene glycol (MIRALAX / GLYCOLAX) packet 17 g  17 g Oral Daily PRN Paralee Cancel, MD      . polyvinyl alcohol (LIQUIFILM TEARS) 1.4 % ophthalmic solution   Both Eyes PRN Ivor Costa, MD      . zolpidem (AMBIEN) tablet 5 mg  5 mg Oral QHS PRN Ivor Costa, MD         Discharge Medications: Please see discharge summary for a  list of discharge medications.  Relevant Imaging Results:  Relevant Lab Results:   Additional Information SSN: [id number]  Alla German, LCSW

## 2017-01-15 NOTE — Evaluation (Signed)
Occupational Therapy Evaluation Patient Details Name: Mallory Rodriguez MRN: [id number] DOB: February 28, 1933 Today's Date: 01/15/2017    History of Present Illness Mallory Rodriguez is a 83 y.o. female with medical history significant of hypertension, hyperlipidemia, TIA, pernicious anemia, MVP, hypogammaglobulinemia, angina pectoralis, who presents with right hip pain. after fall resulting in R intertrochanteric fx; Now s/p surgical fixation with intertrochanteric nail, PWB   Clinical Impression   Limited OT eval secondary to patient's refusal. Pt with decreased awareness during OT eval, notified RN of this. Patient presenting with decreased ADL and functional mobility independence and safety. Patient mod I PTA. Patient currently functioning at an overall total assist bed level for ADLs. Patient will benefit from acute OT to increase overall independence in the areas of ADLs, functional mobility, and overall safety in order to safely discharge home with her husband.     Follow Up Recommendations  SNF;Supervision/Assistance - 24 hour    Equipment Recommendations   (defer to next venue)    Recommendations for Other Services  None at this time   Precautions / Restrictions Precautions Precautions: Fall Precaution Comments: Near syncope sitting EOB Restrictions Weight Bearing Restrictions: Yes RLE Weight Bearing: Partial weight bearing RLE Partial Weight Bearing Percentage or Pounds: not specified      Mobility Bed Mobility See PT note for more information. Pt refused to engage in any bed mobility during OT evaluation.  Transfers See PT note for more information General transfer comment: Pt refused with OT    Balance See PT note for more information.     ADL Overall ADL's : Needs assistance/impaired     Grooming: Total assistance;Bed level   Upper Body Bathing: Total assistance;Bed level   Lower Body Bathing: Total assistance;Bed level   Upper Body Dressing : Total assistance;Bed  level   Lower Body Dressing: Total assistance;Bed level     Toilet Transfer Details (indicate cue type and reason): did not attempt, unsafe Toileting- Clothing Manipulation and Hygiene: Total assistance;Bed level     Tub/Shower Transfer Details (indicate cue type and reason): did not attempt, unsafe   General ADL Comments: Limited OT eval at bed level. Pt limited by increased pain and decreased strength in RUE.      Vision Vision Assessment?: No apparent visual deficits          Pertinent Vitals/Pain Pain Assessment: Faces Faces Pain Scale: Hurts worst Pain Location: R hip while supine in bed. Noticable spasm while therapist present.  Pain Descriptors / Indicators: Aching;Grimacing;Guarding Pain Intervention(s): Monitored during session     Hand Dominance Right   Extremity/Trunk Assessment Upper Extremity Assessment Upper Extremity Assessment: RUE deficits/detail (LUE WFL) RUE Deficits / Details: Therapist attempted to test ROM and strength in RUE, pt unable to flex RUE shoudler against gravity and required AAROM to flex elbow.  RUE Coordination: decreased fine motor;decreased gross motor   Lower Extremity Assessment Lower Extremity Assessment: Defer to PT evaluation   Cervical / Trunk Assessment Cervical / Trunk Assessment: Normal   Communication Communication Communication: Expressive difficulties;Receptive difficulties   Cognition Arousal/Alertness: Awake/alert Behavior During Therapy: Restless;Agitated Overall Cognitive Status: Impaired/Different from baseline General Comments: Pt with difficulty following simple commands. Nursing attempting to get pt to use spirometer and told pt to "suck" pt kept blowing and would get agitated stating I am sucking. Therapist attempted to get pt to engage in [facility] exercise to RUE due to decreased AROM, but pt unable to follow simple multimodal cueing. Pt also with some expressive difficulties, when asked pain rating  pt unable to  state pain number.               Home Living Family/patient expects to be discharged to:: Skilled nursing facility   Prior Functioning/Environment Level of Independence: Independent         OT Problem List: Decreased strength;Decreased range of motion;Decreased activity tolerance;Impaired balance (sitting and/or standing);Decreased coordination;Decreased cognition;Decreased safety awareness;Decreased knowledge of use of DME or AE;Decreased knowledge of precautions;Pain;Increased edema;Impaired UE functional use;Impaired sensation   OT Treatment/Interventions: Self-care/ADL training;Therapeutic exercise;Energy conservation;DME and/or AE instruction;Therapeutic activities;Cognitive remediation/compensation;Patient/family education;Balance training    OT Goals(Current goals can be found in the care plan section) Acute Rehab OT Goals Patient Stated Goal: decrease pain OT Goal Formulation: With patient/family Time For Goal Achievement: 01/29/17 Potential to Achieve Goals: Good  OT Frequency: Min 2X/week   Barriers to D/C: None known at this time          End of Session Nurse Communication: Mobility status;Other (comment) (pt's increased confusion and RUE deficits)  Activity Tolerance: Patient limited by pain Patient left: in bed;with call bell/phone within reach;with nursing/sitter in room   Time: 1449-1459 OT Time Calculation (min): 10 min Charges:  OT General Charges $OT Visit: 1 Procedure OT Evaluation $OT Eval Moderate Complexity: 1 Procedure  [name] , MS, OTR/L, CLT Pager: [phone] 01/15/2017, 3:26 PM

## 2017-01-15 NOTE — Evaluation (Signed)
Physical Therapy Evaluation Patient Details Name: Mallory Rodriguez MRN: [id number] DOB: [birth year] Today's Date: 01/15/2017   History of Present Illness  Mallory Rodriguez is a 83 y.o. female with medical history significant of hypertension, hyperlipidemia, TIA, pernicious anemia, MVP, hypogammaglobulinemia, angina pectoralis, who presents with right hip pain. after fall resulting in R intertrochanteric fx; Now s/p surgical fixation with intertrochanteric nail, PWB  Clinical Impression   Patient is s/p above surgery resulting in functional limitations due to the deficits listed below (see PT Problem List). PT eval today limited by near syncopal episode sitting EOB (likely hypotensive episode, but was unable to get BP sitting); Recommending post-acute rehab to maximize independence and safety with mobility and activity tolerance prior to dc home;  Patient will benefit from skilled PT to increase their independence and safety with mobility to allow discharge to the venue listed below.       Follow Up Recommendations SNF    Equipment Recommendations  Rolling walker with 5" wheels;3in1 (PT)    Recommendations for Other Services       Precautions / Restrictions Precautions Precautions: Fall Precaution Comments: Near syncope sitting EOB Restrictions Weight Bearing Restrictions: Yes RLE Weight Bearing: Partial weight bearing RLE Partial Weight Bearing Percentage or Pounds:  (Not specified)      Mobility  Bed Mobility Overal bed mobility: Needs Assistance Bed Mobility: Supine to Sit;Sit to Supine     Supine to sit: +2 for physical assistance;Max assist Sit to supine: Max assist   General bed mobility comments: Very hesitant to move due to anticipation of pain; Max assist and use of bed pad to move hips to EOB in prep for getting up; required +2 assistance  and full support of trunk and RLE during transition to sitting; Became less responsive sitting EOB, so max assist to lay back  down  Transfers                    Ambulation/Gait                Stairs            Wheelchair Mobility    Modified Rankin (Stroke Patients Only)       Balance                                             Pertinent Vitals/Pain Pain Assessment: Faces Faces Pain Scale: Hurts whole lot Pain Location: R hip with movement Pain Descriptors / Indicators: Aching;Grimacing;Guarding Pain Intervention(s): Monitored during session    Home Living Family/patient expects to be discharged to:: Skilled nursing facility                      Prior Function Level of Independence: Independent               Hand Dominance        Extremity/Trunk Assessment   Upper Extremity Assessment Upper Extremity Assessment: Overall WFL for tasks assessed    Lower Extremity Assessment Lower Extremity Assessment: RLE deficits/detail RLE Deficits / Details: Grossly decr aROM and strength, limited by pain postop RLE: Unable to fully assess due to pain       Communication      Cognition Arousal/Alertness: Awake/alert Behavior During Therapy: WFL for tasks assessed/performed Overall Cognitive Status: Within Functional Limits for tasks assessed  General Comments: Needing encouragement to participate    General Comments General comments (skin integrity, edema, etc.): Became less responsive once sitting EOB, difficulty answering questions and difficulty keeping eyes open, so opted to lay back down; once laying down, BP noted to be 116/43, HR 62; after approx 3 minutes, Mallory Rodriguez returned to normal and conversational; this was likely a hypotensive episode the first time sitting up; notified Mallory Males, RN    Exercises     Assessment/Plan    PT Assessment Patient needs continued PT services  PT Problem List Decreased strength;Decreased range of motion;Decreased activity tolerance;Decreased balance;Decreased mobility;Decreased  knowledge of use of DME;Decreased safety awareness;Decreased knowledge of precautions;Cardiopulmonary status limiting activity;Pain          PT Treatment Interventions DME instruction;Gait training;Functional mobility training;Therapeutic activities;Therapeutic exercise;Balance training;Patient/family education    PT Goals (Current goals can be found in the Care Plan section)  Acute Rehab PT Goals Patient Stated Goal: Get better; she is agreeable to post-acute rehab PT Goal Formulation: With patient Time For Goal Achievement: 01/29/17 Potential to Achieve Goals: Good    Frequency Min 3X/week   Barriers to discharge        Co-evaluation               End of Session Equipment Utilized During Treatment: Gait belt Activity Tolerance: Other (comment) (>imited by what was likely a hypotensive episode) Patient left: in bed;with call bell/phone within reach;Other (comment) (bed in semi-chair position) Nurse Communication: Mobility status (Near syncope sitting EOB)         Time: [id number] PT Time Calculation (min) (ACUTE ONLY): 23 min   Charges:   PT Evaluation $PT Eval Moderate Complexity: 1 Procedure PT Treatments $Therapeutic Activity: 8-22 mins   PT G Codes:        Mallory Rodriguez 01/15/2017, 12:06 PM  Mallory Rodriguez, [place] Pager [phone] Office [phone]

## 2017-01-15 NOTE — Progress Notes (Signed)
PROGRESS NOTE    Mallory Rodriguez  [id number] DOB: 07-Mar-1933 DOA: 01/13/2017  PCP: [name], MD   Brief Narrative:  83 y.o. female with hypertension, hyperlipidemia, TIA, pernicious anemia, MVP, hypogammaglobulinemia, angina pectoralis, who presented after an episode of fall when trying to go to bed. She fell on her right side and imeditely thereafter had right hip pain and was not able to bear weight.   Assessment & Plan:   Principal Problem:   Closed right hip fracture, initial encounter ([place]) - s/p hip repair, post op day #1 - pt reports less pain - PT eval pending but SNF likely needed - provide analgesia as needed     Acute post op blood loss anemia - Hg drop from 12 --> 7.5 this AM, post op related - will give one U PRBC this AM and repeat CBC In AM  Active Problems:   HYPERTENSION, BENIGN - reasonable inpatient control     Thrombocytopenia - maybe some post op blood loss - will monitor closely  - CBC in AM    Hypokalemia - mild, supplemented and WNL this AM    Obesity  - Body mass index is 30.23 kg/m.  DVT prophylaxis: Aspirin 325 mg PO QD Code Status: Full  Family Communication: Patient at bedside  Disposition Plan: SNF in 1-2 days based on Hg stability   Consultants:   Ortho   Procedures:   Right hip repair 1/15 --> Dr. Alvan Dame   Antimicrobials:   None   Subjective: No events overnight.   Objective: Vitals:   01/14/17 1800 01/14/17 2300 01/15/17 0550 01/15/17 1259  BP: (!) 102/50 (!) 96/45 (!) 119/48 (!) 106/55  Pulse: 84 86 85 97  Resp: 18 16  16   Temp: 98 F (36.7 C) 98.3 F (36.8 C) 97.7 F (36.5 C) 99.1 F (37.3 C)  TempSrc: Oral Oral Oral Oral  SpO2: 97% 97% 96% 93%  Weight:      Height:        Intake/Output Summary (Last 24 hours) at 01/15/17 1308 Last data filed at 01/14/17 1829  Gross per 24 hour  Intake          1976.67 ml  Output              250 ml  Net          1726.67 ml   Filed Weights   01/13/17 2323    Weight: 72.6 kg (160 lb)    Examination:  General exam: Appears calm and comfortable  Respiratory system: Clear to auscultation. Respiratory effort normal. Cardiovascular system: S1 & S2 heard, RRR. No rubs, gallops or clicks. No pedal edema. Gastrointestinal system: Abdomen is nondistended, soft and nontender. No organomegaly or masses felt.  Central nervous system: Alert and oriented. No focal neurological deficits.  Data Reviewed: I have personally reviewed following labs and imaging studies  CBC:  Recent Labs Lab 01/13/17 2337 01/15/17 0336  WBC 14.0* 8.9  NEUTROABS 11.9*  --   HGB 12.0 7.5*  HCT 36.3 22.6*  MCV 94.0 93.8  PLT 211 123456*   Basic Metabolic Panel:  Recent Labs Lab 01/13/17 2337 01/15/17 0336  NA 138 137  K 3.4* 4.1  CL 102 103  CO2 21* 25  GLUCOSE 152* 178*  BUN 27* 26*  CREATININE 0.97 1.10*  CALCIUM 9.0 8.0*   Coagulation Profile:  Recent Labs Lab 01/13/17 2337  INR 0.96  \ Recent Results (from the past 240 hour(s))  Surgical pcr screen  Status: None   Collection Time: 01/14/17  3:23 AM  Result Value Ref Range Status   MRSA, PCR NEGATIVE NEGATIVE Final   Staphylococcus aureus NEGATIVE NEGATIVE Final    Comment:        The Xpert SA Assay (FDA approved for NASAL specimens in patients over 13 years of age), is one component of a comprehensive surveillance program.  Test performance has been validated by [facility] for patients greater than or equal to 4 year old. It is not intended to diagnose infection nor to guide or monitor treatment.       Radiology Studies: Dg Chest 1 View  Result Date: 01/14/2017 CLINICAL DATA:  Fall with right hip pain. EXAM: CHEST 1 VIEW COMPARISON:  Chest CT 02/22/2015 FINDINGS: The cardiomediastinal contours are normal. The heart is at the upper limits of normal in size. Subsegmental atelectasis or scarring at the left lung base. Pulmonary vasculature is normal. No consolidation, pleural  effusion, or pneumothorax. No acute osseous abnormalities are seen. IMPRESSION: Subsegmental left basilar atelectasis. Otherwise no acute abnormality. Electronically Signed   By: Jeb Levering M.D.   On: 01/14/2017 00:36   Dg Knee Complete 4 Views Right  Result Date: 01/14/2017 CLINICAL DATA:  Fall with right hip and knee pain. EXAM: RIGHT KNEE - COMPLETE 4+ VIEW COMPARISON:  None. FINDINGS: No acute fracture or subluxation. Tricompartmental osteoarthritis with peripheral spurring. Spurring of the tibial spines. There is narrowing of the lateral tibiofemoral joint space. Trace joint effusion. Quadriceps tendon enthesopathy. IMPRESSION: Moderate tricompartmental osteoarthritis without acute fracture or subluxation. Electronically Signed   By: Jeb Levering M.D.   On: 01/14/2017 00:37   Dg C-arm 1-60 Min  Result Date: 01/14/2017 CLINICAL DATA:  Open reduction internal fixation right intertrochanteric fracture EXAM: OPERATIVE right HIP (WITH PELVIS IF PERFORMED) 3 VIEWS TECHNIQUE: Fluoroscopic spot image(s) were submitted for interpretation post-operatively. COMPARISON:  01/13/2017 FINDINGS: The patient is status post open reduction internal fixation of right femoral intertrochanteric fracture. There is intramedullary rod and metallic fixation pin noted along the right femoral neck. There is anatomic alignment. IMPRESSION: Status post open reduction internal fixation of right intertrochanteric proximal femoral fracture. Intramedullary rod and locking pin noted in proximal right femur. There is anatomic alignment. Fluoroscopy time was 1 minute 13 seconds. Please see the operative report Electronically Signed   By: Lahoma Crocker M.D.   On: 01/14/2017 16:46   Dg Hip Operative Unilat W Or W/o Pelvis Right  Result Date: 01/14/2017 CLINICAL DATA:  Open reduction internal fixation right intertrochanteric fracture EXAM: OPERATIVE right HIP (WITH PELVIS IF PERFORMED) 3 VIEWS TECHNIQUE: Fluoroscopic spot image(s)  were submitted for interpretation post-operatively. COMPARISON:  01/13/2017 FINDINGS: The patient is status post open reduction internal fixation of right femoral intertrochanteric fracture. There is intramedullary rod and metallic fixation pin noted along the right femoral neck. There is anatomic alignment. IMPRESSION: Status post open reduction internal fixation of right intertrochanteric proximal femoral fracture. Intramedullary rod and locking pin noted in proximal right femur. There is anatomic alignment. Fluoroscopy time was 1 minute 13 seconds. Please see the operative report Electronically Signed   By: Lahoma Crocker M.D.   On: 01/14/2017 16:46   Dg Hip Unilat With Pelvis 2-3 Views Right  Result Date: 01/14/2017 CLINICAL DATA:  Post fall with right hip pain. EXAM: DG HIP (WITH OR WITHOUT PELVIS) 2-3V RIGHT COMPARISON:  None. FINDINGS: Comminuted displaced intertrochanteric right hip fracture involving the lesser and greater trochanters. There proximal migration of the femoral shaft. Femoral  head remains seated in the acetabulum. No additional acute fracture. Pubic symphysis and sacroiliac joints are congruent. Pubic rami appear intact. IMPRESSION: Comminuted displaced intertrochanteric right hip fracture. Electronically Signed   By: Jeb Levering M.D.   On: 01/14/2017 00:38      Scheduled Meds: . acetaminophen  650 mg Oral TID  . acidophilus  1 capsule Oral Daily  . aspirin EC  325 mg Oral Q breakfast  . calcium-vitamin D  1 tablet Oral Daily  . [START ON 02/28/2017] cyanocobalamin  1,000 mcg Intramuscular Q90 days  . docusate sodium  100 mg Oral BID  . feeding supplement (ENSURE ENLIVE)  237 mL Oral BID BM  . ferrous sulfate  325 mg Oral BID WC  . hydrochlorothiazide  25 mg Oral Daily  . omega-3 acid ethyl esters  1 g Oral Daily   Continuous Infusions: . sodium chloride 50 mL/hr at 01/14/17 1829  . lactated ringers 10 mL/hr (01/14/17 1351)     LOS: 1 day   Time spent: 20 minutes    Faye Ramsay, MD [facility] Pager [phone]  If 7PM-7AM, please contact night-coverage [email or web address] Password [email or web address] 01/15/2017, 1:08 PM

## 2017-01-15 NOTE — Op Note (Signed)
NAMEFARYAL, BEHR                ACCOUNT NO.:  [id number]  MEDICAL RECORD NO.:  [id number]  LOCATION:  A01C                         FACILITY:  [place]  PHYSICIAN:  Pietro Cassis. Alvan Dame, M.D.  DATE OF BIRTH:  1933/04/02  DATE OF PROCEDURE:  01/14/2017 DATE OF DISCHARGE:                              OPERATIVE REPORT   PREOPERATIVE DIAGNOSIS:  Comminuted right intertrochanteric femur fracture.  POSTOPERATIVE DIAGNOSIS:  Comminuted right intertrochanteric femur fracture.  PROCEDURE:  Open reduction and internal fixation of right intertrochanteric femur fracture utilizing a Biomet AFFIXUS nail 11 x 180 mm, required reaming distally due to tighter canal.  FINDINGS:  The patient had a severely comminuted right proximal femur fracture.  I was able to get a fairly decent anatomic reaction with stabilization of the fracture using the system.  SURGEON:  Pietro Cassis. Alvan Dame, M.D.  ASSISTANT:  Surgical team.  ANESTHESIA:  General.  SPECIMENS:  None.  COMPLICATIONS:  None.  BLOOD LOSS:  About 300 mL.  INDICATIONS FOR PROCEDURE:  Ms. Broman is an 83 year old female, who unfortunately had a stumble at her home when she stumbled over her dresser and landing on her right hip.  She had immediate onset of pain and inability to bear weight.  She was brought to the emergency room where radiographs revealed a comminuted intertrochanteric femur fracture.  Orthopedics was consulted for management.  Risks, benefits, and necessity of the procedure were discussed regarding the indications for the procedure.  Postoperative risks of infection, DVT, nonunion, need for future surgeries were reviewed.  Consent was obtained.  PROCEDURE IN DETAIL:  The patient was brought to the operative theater. Once adequate anesthesia, preoperative antibiotics, Ancef administered, she was positioned supine on the fracture table.  Her left unaffected extremity was flexed and abducted out of the way with bony  prominences padded particularly over the peroneal nerve.  With the perineal post placed, the right foot was placed in the traction boot, traction- internal rotation was applied under fluoroscopic imaging to identify the fracture.  She was noted have a fairly significant comminuted fracture. I was able to get it nearly reduced anatomically at this point.  At this point, the right lateral hip was prepped and draped in sterile fashion using shower curtain technique.  A time-out was performed identifying the patient, planned procedure, and extremity.  Fluoroscopy was brought to the field.  A guidewire was used to identify the tip of the trochanter and an incision was made laterally.  Soft tissue dissection was carried to the gluteal fascia which was then split.  A guidewire was then inserted into the region of the tip of the trochanter and was noted to be fairly comminuted.  The proximal femur was then drilled and I initially passed the 11 x 180 mm nail by hand, but was unable to pass it to its appropriate depth.  Evaluating this radiographically, I was concerned about direct impact and fracture. Thus, a ball-tipped guidewire was passed to the knee and I reamed with 10, 11, 11.5, 12 mm reamers.  The nail was then reinserted by hand to its appropriate depth.  The guidewire was removed.  A guidewire was then inserted through the insertion  jig into the center of the head in AP and lateral planes confirmed radiographically.  I measured the depth and selected 110 mm lag screw.  I drilled for the lag screw, then passed the lag screw and when it was at the subchondral bone area, we then used the compression wheel and medialized the shaft to the fracture site.  At this point, I tightened down the locking bolt and then backed it off a quarter turn to allow for further compression with weightbearing.  The distal interlock was placed into the insertion jig.  Final radiographs were obtained in AP  and lateral planes.  The wounds were irrigated.  The proximal wound was closed in layers with #1 Vicryl in the gluteal fascia.  The subcutaneous layers were reapproximated using 2- 0 Vicryl and staples on the skin.  The skin was cleaned, dried, and dressed sterilely using a long Mepilex dressing.  She was then brought to the recovery room in stable condition, extubated, and tolerated the procedure well.     Pietro Cassis Alvan Dame, M.D.     MDO/MEDQ  D:  01/14/2017  T:  01/15/2017  Job:  [id number]

## 2017-01-15 NOTE — Consult Note (Signed)
[facility] CM Primary Care Navigator  01/15/2017  Mallory Rodriguez [birth year] [id number]  Met with patient and husband Nadara Mustard) at the bedside to identify possible discharge needs. Patient reports having right hip pain after injuring hip from a fall that had led to this admission/surgery.  Patient endorses Dr. [name] with [facility] as the primary care provider.    Patient shared using [facility] in [place] to obtain medications without any problem.   Husband reports that patient manages her own medications at home using "pill box" system.   Patient's husband provides transportation to her doctors' appointments.  Husband will be the primary caregiver for patient at home after discharge from rehab facility as stated.   Discharge plan is skilled nursing facility for short term rehabilitation. Both are awaiting for inpatient social worker to assist with such.  Patient and husband voiced understanding to call primary care provider's office when she returns back home for a post discharge follow-up appointment within a week or sooner if needs arise. Patient letter provided as their reminder.  Patient and husband denied further needs or concerns at this time.  For additional questions please contact:  Edwena Felty A. Lathaniel Legate, BSN, RN-BC [facility] PRIMARY CARE Navigator Cell: [phone]

## 2017-01-15 NOTE — Progress Notes (Signed)
Patient ID: Mallory Rodriguez, female   DOB: 1933-09-29, 83 y.o.   MRN: [id number] Subjective: 1 Day Post-Op Procedure(s) (LRB): ORIF RIGHT INTERTROCH NAIL (Right)    Patient reports pain as mild to moderate.  Doing ok when seen in afternoon.  No events  Objective:   VITALS:   Vitals:   01/15/17 0550 01/15/17 1259  BP: (!) 119/48 (!) 106/55  Pulse: 85 97  Resp:  16  Temp: 97.7 F (36.5 C) 99.1 F (37.3 C)    Neurovascular intact Incision: scant drainage  LABS  Recent Labs  01/13/17 2337 01/15/17 0336  HGB 12.0 7.5*  HCT 36.3 22.6*  WBC 14.0* 8.9  PLT 211 148*     Recent Labs  01/13/17 2337 01/15/17 0336  NA 138 137  K 3.4* 4.1  BUN 27* 26*  CREATININE 0.97 1.10*  GLUCOSE 152* 178*     Recent Labs  01/13/17 2337  INR 0.96     Assessment/Plan: 1 Day Post-Op Procedure(s) (LRB): ORIF RIGHT INTERTROCH NAIL (Right)   Advance diet Up with therapy Discharge to SNF Thursday or Friday  Dressing changes daily as needed and prior to discharge - Mepilex RTC in 2 weeks - Trindon Dorton PWB Continue Eliquis for DVT prohylaxis

## 2017-01-16 LAB — NO BLOOD PRODUCTS

## 2017-01-16 LAB — BASIC METABOLIC PANEL
ANION GAP: 8 (ref 5–15)
BUN: 31 mg/dL — AB (ref 6–20)
CHLORIDE: 102 mmol/L (ref 101–111)
CO2: 26 mmol/L (ref 22–32)
Calcium: 8.2 mg/dL — ABNORMAL LOW (ref 8.9–10.3)
Creatinine, Ser: 1.2 mg/dL — ABNORMAL HIGH (ref 0.44–1.00)
GFR calc Af Amer: 48 mL/min — ABNORMAL LOW (ref >60)
GFR, EST NON AFRICAN AMERICAN: 41 mL/min — AB (ref >60)
GLUCOSE: 110 mg/dL — AB (ref 65–99)
POTASSIUM: 3.7 mmol/L (ref 3.5–5.1)
Sodium: 136 mmol/L (ref 135–145)

## 2017-01-16 LAB — CBC
HEMATOCRIT: 18.4 % — AB (ref 36.0–46.0)
HEMOGLOBIN: 6.1 g/dL — AB (ref 12.0–15.0)
MCH: 31.3 pg (ref 26.0–34.0)
MCHC: 33.2 g/dL (ref 30.0–36.0)
MCV: 94.4 fL (ref 78.0–100.0)
PLATELETS: 130 10*3/uL — AB (ref 150–400)
RBC: 1.95 MIL/uL — AB (ref 3.87–5.11)
RDW: 15.4 % (ref 11.5–15.5)
WBC: 9.8 10*3/uL (ref 4.0–10.5)

## 2017-01-16 MED ORDER — SODIUM CHLORIDE 0.9 % IV SOLN: Freq: Once | INTRAVENOUS | Status: DC

## 2017-01-16 NOTE — Progress Notes (Signed)
Physical Therapy Treatment Patient Details Name: Mallory Rodriguez MRN: [id number] DOB: 1933-09-27 Today's Date: 01/16/2017    History of Present Illness Mallory Rodriguez is a 83 y.o. female with medical history significant of hypertension, hyperlipidemia, TIA, pernicious anemia, MVP, hypogammaglobulinemia, angina pectoralis, who presents with right hip pain. after fall resulting in R intertrochanteric fx; Now s/p surgical fixation with intertrochanteric nail, PWB    PT Comments    Discussed Mallory Rodriguez with Dr. Doyle Askew; We decided to hold on mobility/OOB today given near syncope yesterday and low Hgb today (6.1); Session focused on therex to engage muscle control of R hip; Decr cognition compared to yesterday -- Noted Mallory Rodriguez had difficulty counting her reps during therex, needing hint cues (i.e. "what comes after 2"); Discussed cognitive status with RN; Noted CT of head yesterday with no acute changes  Follow Up Recommendations  SNF     Equipment Recommendations  Rolling walker with 5" wheels;3in1 (PT)    Recommendations for Other Services       Precautions / Restrictions Precautions Precautions: Fall Precaution Comments: Near syncope sitting EOB Restrictions RLE Weight Bearing: Partial weight bearing RLE Partial Weight Bearing Percentage or Pounds: not specified    Mobility  Bed Mobility                  Transfers                    Ambulation/Gait                 Stairs            Wheelchair Mobility    Modified Rankin (Stroke Patients Only)       Balance                                    Cognition Arousal/Alertness: Awake/alert Behavior During Therapy: WFL for tasks assessed/performed Overall Cognitive Status: Impaired/Different from baseline Area of Impairment: Problem solving       Following Commands: Follows one step commands with increased time     Problem Solving: Slow processing;Decreased initiation;Requires  verbal cues General Comments: Difficulty counting reps with therex    Exercises Total Joint Exercises Quad Sets: AROM;Right;10 reps Gluteal Sets: AROM;Both;10 reps Towel Squeeze: AROM;Both;10 reps Short Arc QuadSinclair Rodriguez;Right;10 reps Heel Slides: AAROM;Right;10 reps Hip ABduction/ADduction: AROM;Right;10 reps    General Comments        Pertinent Vitals/Pain Pain Assessment: Faces Faces Pain Scale: Hurts whole lot Pain Location: R hip with AAROM therex Pain Descriptors / Indicators: Aching;Grimacing;Guarding Pain Intervention(s): Monitored during session    Home Living                      Prior Function            PT Goals (current goals can now be found in the care plan section) Acute Rehab PT Goals Patient Stated Goal: decrease pain PT Goal Formulation: Patient unable to participate in goal setting Time For Goal Achievement: 01/29/17 Potential to Achieve Goals: Fair Progress towards PT goals: Progressing toward goals    Frequency    Min 3X/week      PT Plan Current plan remains appropriate    Co-evaluation             End of Session   Activity Tolerance: Patient tolerated treatment well Patient left: in bed;with call bell/phone within reach  Time: [id number] PT Time Calculation (min) (ACUTE ONLY): 15 min  Charges:  $Therapeutic Exercise: 8-22 mins                    G Codes:      Mallory Rodriguez Feb 03, 2017, 2:05 PM  Mallory Rodriguez, [place] Pager [phone] Office [phone]

## 2017-01-16 NOTE — Progress Notes (Signed)
PROGRESS NOTE    Mallory Rodriguez  [id number] DOB: Mar 19, 1933 DOA: 01/13/2017  PCP: [name], MD   Brief Narrative:  83 y.o. female with hypertension, hyperlipidemia, TIA, pernicious anemia, MVP, hypogammaglobulinemia, angina pectoralis, who presented after an episode of fall when trying to go to bed. She fell on her right side and imeditely thereafter had right hip pain and was not able to bear weight.   Assessment & Plan:   Principal Problem:   Closed right hip fracture, initial encounter ([place]) - s/p hip repair, post op day #2 - pt reports feeling tired  - pt will likely need SNF - provide analgesia as needed     Acute post op blood loss anemia - Hg drop from 12 --> 7.5 --> 6.5 this AM, post op related - pt declined transfusion as she is Jehovah's witness  - IV iron can be considered but will discuss with family   Active Problems:   HYPERTENSION, BENIGN - reasonable inpatient control     Thrombocytopenia - maybe some post op blood loss - will monitor closely  - CBC in AM    Hypokalemia - mild, supplemented  - BMP in AM    Obesity  - Body mass index is 30.23 kg/m.  DVT prophylaxis: Aspirin 325 mg PO QD Code Status: Full  Family Communication: Patient at bedside, left message for husband on the home phone number  Disposition Plan: SNF in 1-2 days based on Hg stability   Consultants:   Ortho   Procedures:   Right hip repair 1/15 --> Dr. Alvan Dame   Antimicrobials:   None   Subjective: No events overnight.   Objective: Vitals:   01/15/17 1259 01/15/17 2057 01/16/17 0532 01/16/17 0639  BP: (!) 106/55 (!) 115/42 (!) 125/56 (!) 126/52  Pulse: 97 96 (!) 103 94  Resp: 16 16 16 16   Temp: 99.1 F (37.3 C) 98.9 F (37.2 C) 98.2 F (36.8 C) 98 F (36.7 C)  TempSrc: Oral Oral Oral Oral  SpO2: 93% 96% 94% 96%  Weight:      Height:       No intake or output data in the 24 hours ending 01/16/17 1410 Filed Weights   01/13/17 2323  Weight: 72.6 kg  (160 lb)    Examination:  General exam: Appears calm and comfortable  Respiratory system: Clear to auscultation. Respiratory effort normal. Cardiovascular system: S1 & S2 heard, RRR. No rubs, gallops or clicks. No pedal edema. Gastrointestinal system: Abdomen is nondistended, soft and nontender. No organomegaly or masses felt.  Central nervous system: Alert and oriented. No focal neurological deficits.  Data Reviewed: I have personally reviewed following labs and imaging studies  CBC:  Recent Labs Lab 01/13/17 2337 01/15/17 0336 01/16/17 0412  WBC 14.0* 8.9 9.8  NEUTROABS 11.9*  --   --   HGB 12.0 7.5* 6.1*  HCT 36.3 22.6* 18.4*  MCV 94.0 93.8 94.4  PLT 211 148* [id number]*   Basic Metabolic Panel:  Recent Labs Lab 01/13/17 2337 01/15/17 0336 01/16/17 0412  NA 138 137 136  K 3.4* 4.1 3.7  CL 102 103 102  CO2 21* 25 26  GLUCOSE 152* 178* 110*  BUN 27* 26* 31*  CREATININE 0.97 1.10* 1.20*  CALCIUM 9.0 8.0* 8.2*   Coagulation Profile:  Recent Labs Lab 01/13/17 2337  INR 0.96  \ Recent Results (from the past 240 hour(s))  Surgical pcr screen     Status: None   Collection Time: 01/14/17  3:23 AM  Result Value Ref Range Status   MRSA, PCR NEGATIVE NEGATIVE Final   Staphylococcus aureus NEGATIVE NEGATIVE Final    Comment:        The Xpert SA Assay (FDA approved for NASAL specimens in patients over 60 years of age), is one component of a comprehensive surveillance program.  Test performance has been validated by [facility] for patients greater than or equal to 42 year old. It is not intended to diagnose infection nor to guide or monitor treatment.       Radiology Studies: Ct Head Wo Contrast  Result Date: 01/15/2017 CLINICAL DATA:  Confusion. Pt denies headache. Pt states she did have a dizzy spell today. EXAM: CT HEAD WITHOUT CONTRAST TECHNIQUE: Contiguous axial images were obtained from the base of the skull through the vertex without intravenous  contrast. COMPARISON:  Head CT dated 06/11/2016. FINDINGS: Brain: Again noted is mild generalized age related parenchymal atrophy with commensurate dilatation of the ventricles and sulci. Dystrophic calcifications within the left basal ganglia are stable, likely sequela of previous infarct. Additional physiologic calcifications again noted within each basal ganglia region. Mild chronic small vessel ischemic changes noted within the bilateral periventricular white matter regions. There is no mass, hemorrhage, edema or other evidence of acute parenchymal abnormality. No extra-axial hemorrhage. Vascular: There are chronic calcified atherosclerotic changes of the large vessels at the skull base. No unexpected hyperdense vessel. Skull: Normal. Negative for fracture or focal lesion. Sinuses/Orbits: Chronic appearing mucosal thickening within the upper maxillary sinuses bilaterally. Other: None. IMPRESSION: No acute findings.  No intracranial mass, hemorrhage or edema. Chronic ischemic changes, as detailed above. Electronically Signed   By: Franki Cabot M.D.   On: 01/15/2017 17:17   Dg C-arm 1-60 Min  Result Date: 01/14/2017 CLINICAL DATA:  Open reduction internal fixation right intertrochanteric fracture EXAM: OPERATIVE right HIP (WITH PELVIS IF PERFORMED) 3 VIEWS TECHNIQUE: Fluoroscopic spot image(s) were submitted for interpretation post-operatively. COMPARISON:  01/13/2017 FINDINGS: The patient is status post open reduction internal fixation of right femoral intertrochanteric fracture. There is intramedullary rod and metallic fixation pin noted along the right femoral neck. There is anatomic alignment. IMPRESSION: Status post open reduction internal fixation of right intertrochanteric proximal femoral fracture. Intramedullary rod and locking pin noted in proximal right femur. There is anatomic alignment. Fluoroscopy time was 1 minute 13 seconds. Please see the operative report Electronically Signed   By: Lahoma Crocker M.D.   On: 01/14/2017 16:46   Dg Hip Operative Unilat W Or W/o Pelvis Right  Result Date: 01/14/2017 CLINICAL DATA:  Open reduction internal fixation right intertrochanteric fracture EXAM: OPERATIVE right HIP (WITH PELVIS IF PERFORMED) 3 VIEWS TECHNIQUE: Fluoroscopic spot image(s) were submitted for interpretation post-operatively. COMPARISON:  01/13/2017 FINDINGS: The patient is status post open reduction internal fixation of right femoral intertrochanteric fracture. There is intramedullary rod and metallic fixation pin noted along the right femoral neck. There is anatomic alignment. IMPRESSION: Status post open reduction internal fixation of right intertrochanteric proximal femoral fracture. Intramedullary rod and locking pin noted in proximal right femur. There is anatomic alignment. Fluoroscopy time was 1 minute 13 seconds. Please see the operative report Electronically Signed   By: Lahoma Crocker M.D.   On: 01/14/2017 16:46      Scheduled Meds: . sodium chloride   Intravenous Once  . sodium chloride   Intravenous Once  . acetaminophen  650 mg Oral TID  . acidophilus  1 capsule Oral Daily  . aspirin EC  325 mg Oral  Q breakfast  . calcium-vitamin D  1 tablet Oral Daily  . [START ON 02/28/2017] cyanocobalamin  1,000 mcg Intramuscular Q90 days  . docusate sodium  100 mg Oral BID  . feeding supplement (ENSURE ENLIVE)  237 mL Oral BID BM  . ferrous sulfate  325 mg Oral BID WC  . hydrochlorothiazide  25 mg Oral Daily  . omega-3 acid ethyl esters  1 g Oral Daily   Continuous Infusions: . lactated ringers 10 mL/hr (01/14/17 1351)     LOS: 2 days   Time spent: 20 minutes   Faye Ramsay, MD [facility] Pager [phone]  If 7PM-7AM, please contact night-coverage [email or web address] Password [email or web address] 01/16/2017, 2:10 PM

## 2017-01-16 NOTE — Progress Notes (Signed)
CRITICAL VALUE ALERT  Critical value received:  Hemoglobin 6.1  Date of notification:  01/16/2017   Time of notification:  0604  Critical value read back:Yes.    Nurse who received alert:  [place] Carmelia Tiner  MD notified (1st page):  [facility]  Time of first page:  0720  MD notified (2nd page):  Time of second page:  Responding MD: Greggory Brandy   Time MD responded:  [phone]

## 2017-01-17 LAB — CBC
HCT: 21.8 % — ABNORMAL LOW (ref 36.0–46.0)
HEMOGLOBIN: 7.3 g/dL — AB (ref 12.0–15.0)
MCH: 31.1 pg (ref 26.0–34.0)
MCHC: 33.5 g/dL (ref 30.0–36.0)
MCV: 92.8 fL (ref 78.0–100.0)
PLATELETS: 134 10*3/uL — AB (ref 150–400)
RBC: 2.35 MIL/uL — AB (ref 3.87–5.11)
RDW: 15.4 % (ref 11.5–15.5)
WBC: 8.6 10*3/uL (ref 4.0–10.5)

## 2017-01-17 LAB — TYPE AND SCREEN
ABO/RH(D): A POS
Antibody Screen: NEGATIVE
UNIT DIVISION: 0

## 2017-01-17 NOTE — Plan of Care (Signed)
Problem: Safety: Goal: Ability to remain free from injury will improve Outcome: Progressing No fall or injury noted this shift. Safety precautions and  Fall preventions maintained  Problem: Pain Managment: Goal: General experience of comfort will improve Outcome: Progressing No complaint if pain  Problem: Tissue Perfusion: Goal: Risk factors for ineffective tissue perfusion will decrease Outcome: Progressing No S/S of DVT noted  Problem: Physical Regulation: Goal: Will remain free from infection Outcome: Progressing No S//S of infection noted Goal: Postoperative complications will be avoided or minimized Outcome: Progressing No post op complications noted  Problem: Self-Concept: Goal: Verbalizations of decreased anxiety will increase Outcome: Progressing No anxiety noted this shift  Problem: Pain Management: Goal: Pain level will decrease Outcome: Progressing Patient is on schedule Tylenol, denies pain and discomfort

## 2017-01-17 NOTE — Clinical Social Work Note (Signed)
CSW spoke with pt at bedside to provide b/o. Pt ask that CSW call her daughter and see where she wants her to go. CSW spoke with pt's daughter. As the bed offers stand pt's daughter wants her mom to go to [facility] due to location however, pt's daughter explained her father lives in [place] and location is a factor. With pt's daughter approval CSW sent pt's referral to facilities in [place] as well. After conversation Pt's daughter states as of right now [facility] they choose, and the only facility in [place] pt's daughter would want her mom to go to would be [facility]. If [facility] does not make a b/o pt's daughter states they want pt to go to [facility]. CSW will follow up with pt's daughter closer to d/c.  [address] [place]

## 2017-01-17 NOTE — Care Management (Signed)
Case manager spoke with patient and husband concerning discharge plan. At this time she will go to SNF for shortterm rehab when medically stable. Social worker is aware. Ricki Miller, RN BSN Case Manager

## 2017-01-17 NOTE — Clinical Social Work Placement (Signed)
   CLINICAL SOCIAL WORK PLACEMENT  NOTE  Date:  01/17/2017  Patient Details  Name: Mallory Rodriguez MRN: [id number] Date of Birth: 01/12/1933  Clinical Social Work is seeking post-discharge placement for this patient at the [place] level of care (*CSW will initial, date and re-position this form in  chart as items are completed):      Patient/family provided with [place] Work Department's list of facilities offering this level of care within the geographic area requested by the patient (or if unable, by the patient's family).      Patient/family informed of their freedom to choose among providers that offer the needed level of care, that participate in Medicare, Medicaid or managed care program needed by the patient, have an available bed and are willing to accept the patient.      Patient/family informed of [place]'s ownership interest in [facility] and [facility], as well as of the fact that they are under no obligation to receive care at these facilities.  PASRR submitted to EDS on       PASRR number received on 01/15/17     Existing PASRR number confirmed on       FL2 transmitted to all facilities in geographic area requested by pt/family on 01/15/17     FL2 transmitted to all facilities within larger geographic area on       Patient informed that his/her managed care company has contracts with or will negotiate with certain facilities, including the following:        Yes   Patient/family informed of bed offers received.  Patient chooses bed at       Physician recommends and patient chooses bed at      Patient to be transferred to   on  .  Patient to be transferred to facility by       Patient family notified on   of transfer.  Name of family member notified:        PHYSICIAN Please prepare priority discharge summary, including medications, Please prepare prescriptions     Additional Comment:     _______________________________________________ Alla German, LCSW 01/17/2017, 12:57 PM

## 2017-01-17 NOTE — Progress Notes (Signed)
Occupational Therapy Treatment Patient Details Name: Mallory Rodriguez MRN: [id number] DOB: 1933/10/25 Today's Date: 01/17/2017    History of present illness Mallory Rodriguez is a 83 y.o. female with medical history significant of hypertension, hyperlipidemia, TIA, pernicious anemia, MVP, hypogammaglobulinemia, angina pectoralis, who presents with right hip pain. after fall resulting in R intertrochanteric fx; Now s/p surgical fixation with intertrochanteric nail, PWB   OT comments  Patient making steady progress towards OT goals, continue plan of care for now. Pt with less confusion during this OT session as opposed to last OT session. See below under ADL comments for information regarding this session.    Follow Up Recommendations  SNF;Supervision/Assistance - 24 hour    Equipment Recommendations   (defer to next venue)    Recommendations for Other Services  None at this time   Precautions / Restrictions Precautions Precautions: Fall Precaution Comments: Near syncope sitting EOB Restrictions Weight Bearing Restrictions: Yes RLE Weight Bearing: Partial weight bearing RLE Partial Weight Bearing Percentage or Pounds: not specified    Mobility - Per PT Bed Mobility Overal bed mobility: Needs Assistance Bed Mobility: Supine to Sit;Sit to Supine     Supine to sit: +2 for physical assistance;Mod assist     General bed mobility comments: Improved ability to transfer over to the edge of the bed. Mod a of right leg to get to the edge. Mod a to sit up.   Transfers Overall transfer level: Needs assistance   Transfers: Stand Pivot Transfers   Stand pivot transfers: +2 physical assistance;Max assist       General transfer comment: +2 max a to transfer to the chair. Max verbal and tactile cuing to not put weight on her right lower extremity. Patient reported pain but no syncope. She had no syncope upon initial sitting.         ADL Overall ADL's : Needs  assistance/impaired Eating/Feeding: Set up;Sitting   Grooming: Set up;Sitting   Upper Body Bathing: Minimal assistance;Sitting   Lower Body Bathing: Maximal assistance;Sitting/lateral leans   Upper Body Dressing : Minimal assistance;Sitting   Lower Body Dressing: Maximal assistance;Sitting/lateral leans     Toilet Transfer Details (indicate cue type and reason): did not attempt Toileting- Clothing Manipulation and Hygiene: Total assistance;Bed level     Tub/Shower Transfer Details (indicate cue type and reason): did not attempt   General ADL Comments: Pt found seated in recliner. Pt stated PT assisted her into recliner. Therapist worked with pt on UB and LB ADLs in seated position. Pt also engaged in BUE functional strengthening exercises, see below. Prior to session, pt's BP in sitting=114/46 and after activity in sitting=132/54. Therapist educated pt on OT role and OT goals.      Cognition   Behavior During Therapy: WFL for tasks assessed/performed Overall Cognitive Status: Impaired/Different from baseline Area of Impairment: Problem solving        Following Commands: Follows one step commands with increased time     Problem Solving: Slow processing;Decreased initiation;Requires verbal cues;Difficulty sequencing General Comments: Difficulty counting reps with therex      Exercises Total Joint Exercises Ankle Circles/Pumps: 20 reps Quad Sets: AROM;Right;10 reps General Exercises - Upper Extremity Shoulder Flexion: AAROM;AROM;Both;10 reps;Right;Seated Shoulder Extension: AROM;AAROM;Right;Both;10 reps;Seated Shoulder ABduction: AROM;AAROM;Right;Both;10 reps;Seated Shoulder ADduction: AROM;AAROM;Right;Both;10 reps;Seated           Pertinent Vitals/ Pain       Pain Assessment: 0-10 Pain Score: 8  Faces Pain Scale: Hurts whole lot Pain Location: R hip with movement  Pain Descriptors /  Indicators: Aching;Grimacing;Guarding Pain Intervention(s): Monitored during  session;Repositioned         Frequency  Min 2X/week      Progress Toward Goals  OT Goals(current goals can now be found in the care plan section)  Progress towards OT goals: Progressing toward goals  Acute Rehab OT Goals Patient Stated Goal: go to SNF OT Goal Formulation: With patient Time For Goal Achievement: 01/29/17 Potential to Achieve Goals: Good  Plan Discharge plan remains appropriate          Activity Tolerance Patient tolerated treatment well   Patient Left in chair;with call bell/phone within reach     Time: 1252-1315 OT Time Calculation (min): 23 min  Charges: OT General Charges $OT Visit: 1 Procedure OT Treatments $Self Care/Home Management : 8-22 mins $Therapeutic Exercise: 8-22 mins  Mallory Rodriguez , MS, OTR/L, CLT Pager: [phone] 01/17/2017, 1:23 PM

## 2017-01-17 NOTE — Clinical Social Work Note (Signed)
Clinical Social Work Assessment  Patient Details  Name: Mallory Rodriguez MRN: [id number] Date of Birth: [birth year]  Date of referral:  01/17/17               Reason for consult:  Facility Placement                Permission sought to share information with:  Family Supports Permission granted to share information::  Yes, Verbal Permission Granted  Name::     [place]::     Relationship::  Daughter  Contact Information:  [phone]  Housing/Transportation Living arrangements for the past 2 months:  Single Family Home Source of Information:  Patient Patient Interpreter Needed:  None Criminal Activity/Legal Involvement Pertinent to Current Situation/Hospitalization:  No - Comment as needed Significant Relationships:  Adult Children, Spouse Lives with:  Spouse Do you feel safe going back to the place where you live?  Yes Need for family participation in patient care:     Care giving concerns:  No family or friends at bedside during initial assessment. Pt verbalized permission for CSW to contact her daughter.   Social Worker assessment / plan:  CSW spoke with pt at bedside to complete initial assessment. Pt was engaged. Pt states she lives at home with her husband and pt reports he will be able to care for her after rehab. Pt is agreeable to SNF placement at this time. Pt ask that CSW call her daughter in order to determine will pt will go. CSW will follow up with daughter to present b/o.  Employment status:  Retired Forensic scientist:  Medicare PT Recommendations:  [place] / Referral to community resources:  [place]  Patient/Family's Response to care:  Pt verbalized understanding of CSW role and expressed appreciation for support. Pt denies any concern regarding pt care at this time.   Patient/Family's Understanding of and Emotional Response to Diagnosis, Current Treatment, and Prognosis:  Pt understanding of physical limitations.  Pt agreeable to SNF placement at this time. Pt denies any concern regarding treatment plan at this time. Pt reponses emotionally appropriate. CSW will continue to provide support.  Emotional Assessment Appearance:  Appears stated age Attitude/Demeanor/Rapport:   (Patient was appropriate.) Affect (typically observed):  Accepting, Appropriate, Calm Orientation:  Oriented to Self, Oriented to Place Alcohol / Substance use:  Not Applicable Psych involvement (Current and /or in the community):  No (Comment)  Discharge Needs  Concerns to be addressed:  No discharge needs identified Readmission within the last 30 days:  No Current discharge risk:  Dependent with Mobility Barriers to Discharge:  Continued Medical Work up   [employer], LCSW 01/17/2017, 12:53 PM

## 2017-01-17 NOTE — Progress Notes (Signed)
Physical Therapy Treatment Patient Details Name: Sakari Havrilla MRN: [id number] DOB: [birth year] Today's Date: 01/17/2017    History of Present Illness Kamoni Deitz is a 83 y.o. female with medical history significant of hypertension, hyperlipidemia, TIA, pernicious anemia, MVP, hypogammaglobulinemia, angina pectoralis, who presents with right hip pain. after fall resulting in R intertrochanteric fx; Now s/p surgical fixation with intertrochanteric nail, PWB    PT Comments    Patient had no syncope upon initial sitting. She reported feeling good sitting in a chair. She had some pain with transfer but appeared to follow her weight bearing percautions. She required max a to transfer. She would benefit from rehabilitation at a SNF.   Follow Up Recommendations  SNF     Equipment Recommendations  Rolling walker with 5" wheels;3in1 (PT)    Recommendations for Other Services       Precautions / Restrictions Precautions Precautions: Fall Precaution Comments: Near syncope sitting EOB Restrictions Weight Bearing Restrictions: Yes RLE Weight Bearing: Partial weight bearing    Mobility  Bed Mobility Overal bed mobility: Needs Assistance Bed Mobility: Supine to Sit;Sit to Supine     Supine to sit: +2 for physical assistance;Mod assist     General bed mobility comments: Improved ability to transfer over to the edge of the bed. Mod a of right leg to get to the edge. Mod a to sit up.   Transfers Overall transfer level: Needs assistance   Transfers: Stand Pivot Transfers   Stand pivot transfers: +2 physical assistance;Max assist       General transfer comment: +2 max a to transfer to the chair. Max verbal and tactile cuing to not put weight on her right lower extremity. Patient reported pain but no syncope. She had no syncope upon initial sitting.   Ambulation/Gait                 Stairs            Wheelchair Mobility    Modified Rankin (Stroke Patients Only)        Balance                                    Cognition Arousal/Alertness: Awake/alert Behavior During Therapy: WFL for tasks assessed/performed Overall Cognitive Status: Impaired/Different from baseline Area of Impairment: Problem solving       Following Commands: Follows one step commands with increased time     Problem Solving: Slow processing;Decreased initiation;Requires verbal cues General Comments: Difficulty counting reps with therex    Exercises Total Joint Exercises Ankle Circles/Pumps: 20 reps Quad Sets: AROM;Right;10 reps    General Comments        Pertinent Vitals/Pain Pain Assessment: Faces Faces Pain Scale: Hurts whole lot Pain Location: R hip with AAROM therex Pain Descriptors / Indicators: Aching;Grimacing;Guarding    Home Living                      Prior Function            PT Goals (current goals can now be found in the care plan section) Acute Rehab PT Goals PT Goal Formulation: With patient Time For Goal Achievement: 01/29/17 Potential to Achieve Goals: Fair Progress towards PT goals: Progressing toward goals    Frequency    Min 3X/week      PT Plan Current plan remains appropriate    Co-evaluation  End of Session   Activity Tolerance: Patient tolerated treatment well Patient left: in bed;with call bell/phone within reach     Time: 1155-1213 PT Time Calculation (min) (ACUTE ONLY): 18 min  Charges:  $Therapeutic Activity: 8-22 mins                    G Codes:      Carney Living PT DPT  01/17/2017, 1:14 PM

## 2017-01-17 NOTE — Progress Notes (Addendum)
PROGRESS NOTE  Mallory Rodriguez  [id number] DOB: [birth year] DOA: 01/13/2017  PCP: [name], MD   Brief Narrative:  83 y.o. female with hypertension, hyperlipidemia, TIA, pernicious anemia, MVP, hypogammaglobulinemia, angina pectoralis, who presented after an episode of fall when trying to go to bed. She fell on her right side and imeditely thereafter had right hip pain and was not able to bear weight.   Assessment & Plan:   Principal Problem:   Closed right hip fracture, initial encounter ([facility]) - s/p hip repair, post op day #3 - pt reports feeling tired  - pt will likely need SNF - provide analgesia as needed     Acute post op blood loss anemia - Hg drop from 12 --> 7.5 --> 6.5 --> 7.1 (had one U PRBC transfused 1/17) this AM, post op related - may need to hold Eliquis today to see if this will help  - ask for FOBT - CBC in AM    Acute kidney injury - from post op anemia  - BMP in AM  Active Problems:   HYPERTENSION, BENIGN - reasonable inpatient control     Thrombocytopenia - maybe some post op blood loss - better this AM  - CBC in AM    Hypokalemia - mild, supplemented  - BMP in AM    Obesity  - Body mass index is 30.23 kg/m.  DVT prophylaxis: Aspirin 325 mg PO QD Code Status: Full  Family Communication: Patient at bedside, daughter who is POA over the phone  Disposition Plan: SNF in 1-2 days based on Hg stability   Consultants:   Ortho   Procedures:   Right hip repair 1/15 --> Dr. Alvan Dame   Antimicrobials:   None   Subjective: No events overnight.   Objective: Vitals:   01/16/17 2315 01/17/17 0103 01/17/17 0200 01/17/17 0330  BP: 98/73 124/60 130/60 (!) 135/58  Pulse: 90 88 89 87  Resp:   16 15  Temp: 98.6 F (37 C) 98.2 F (36.8 C) 98 F (36.7 C) 98 F (36.7 C)  TempSrc: Oral Oral Oral Oral  SpO2:      Weight:      Height:        Intake/Output Summary (Last 24 hours) at 01/17/17 1221 Last data filed at 01/17/17 0103  Gross  per 24 hour  Intake              660 ml  Output                0 ml  Net              660 ml   Filed Weights   01/13/17 2323  Weight: 72.6 kg (160 lb)    Examination:  General exam: Appears calm and comfortable  Respiratory system: Clear to auscultation. Respiratory effort normal. Cardiovascular system: S1 & S2 heard, RRR. No rubs, gallops or clicks. No pedal edema. Gastrointestinal system: Abdomen is nondistended, soft and nontender. No organomegaly or masses felt.  Central nervous system: Alert and oriented. No focal neurological deficits.  Data Reviewed: I have personally reviewed following labs and imaging studies  CBC:  Recent Labs Lab 01/13/17 2337 01/15/17 0336 01/16/17 0412 01/17/17 0322  WBC 14.0* 8.9 9.8 8.6  NEUTROABS 11.9*  --   --   --   HGB 12.0 7.5* 6.1* 7.3*  HCT 36.3 22.6* 18.4* 21.8*  MCV 94.0 93.8 94.4 92.8  PLT 211 148* 130* [id number]*   Basic Metabolic Panel:  Recent Labs Lab 01/13/17 2337 01/15/17 0336 01/16/17 0412  NA 138 137 136  K 3.4* 4.1 3.7  CL 102 103 102  CO2 21* 25 26  GLUCOSE 152* 178* 110*  BUN 27* 26* 31*  CREATININE 0.97 1.10* 1.20*  CALCIUM 9.0 8.0* 8.2*   Coagulation Profile:  Recent Labs Lab 01/13/17 2337  INR 0.96  \ Recent Results (from the past 240 hour(s))  Surgical pcr screen     Status: None   Collection Time: 01/14/17  3:23 AM  Result Value Ref Range Status   MRSA, PCR NEGATIVE NEGATIVE Final   Staphylococcus aureus NEGATIVE NEGATIVE Final    Comment:        The Xpert SA Assay (FDA approved for NASAL specimens in patients over 33 years of age), is one component of a comprehensive surveillance program.  Test performance has been validated by [facility] for patients greater than or equal to 75 year old. It is not intended to diagnose infection nor to guide or monitor treatment.       Radiology Studies: Ct Head Wo Contrast  Result Date: 01/15/2017 CLINICAL DATA:  Confusion. Pt denies headache. Pt  states she did have a dizzy spell today. EXAM: CT HEAD WITHOUT CONTRAST TECHNIQUE: Contiguous axial images were obtained from the base of the skull through the vertex without intravenous contrast. COMPARISON:  Head CT dated 06/11/2016. FINDINGS: Brain: Again noted is mild generalized age related parenchymal atrophy with commensurate dilatation of the ventricles and sulci. Dystrophic calcifications within the left basal ganglia are stable, likely sequela of previous infarct. Additional physiologic calcifications again noted within each basal ganglia region. Mild chronic small vessel ischemic changes noted within the bilateral periventricular white matter regions. There is no mass, hemorrhage, edema or other evidence of acute parenchymal abnormality. No extra-axial hemorrhage. Vascular: There are chronic calcified atherosclerotic changes of the large vessels at the skull base. No unexpected hyperdense vessel. Skull: Normal. Negative for fracture or focal lesion. Sinuses/Orbits: Chronic appearing mucosal thickening within the upper maxillary sinuses bilaterally. Other: None. IMPRESSION: No acute findings.  No intracranial mass, hemorrhage or edema. Chronic ischemic changes, as detailed above. Electronically Signed   By: Franki Cabot M.D.   On: 01/15/2017 17:17      Scheduled Meds: . sodium chloride   Intravenous Once  . sodium chloride   Intravenous Once  . acetaminophen  650 mg Oral TID  . acidophilus  1 capsule Oral Daily  . aspirin EC  325 mg Oral Q breakfast  . calcium-vitamin D  1 tablet Oral Daily  . [START ON 02/28/2017] cyanocobalamin  1,000 mcg Intramuscular Q90 days  . docusate sodium  100 mg Oral BID  . feeding supplement (ENSURE ENLIVE)  237 mL Oral BID BM  . ferrous sulfate  325 mg Oral BID WC  . hydrochlorothiazide  25 mg Oral Daily  . omega-3 acid ethyl esters  1 g Oral Daily   Continuous Infusions: . lactated ringers 10 mL/hr (01/14/17 1351)     LOS: 3 days   Time spent: 20  minutes   Faye Ramsay, MD [facility] Pager [phone]  If 7PM-7AM, please contact night-coverage [email or web address] Password [facility] 01/17/2017, 12:21 PM

## 2017-01-17 NOTE — Progress Notes (Signed)
Physical Therapy Treatment Patient Details Name: Mallory Rodriguez MRN: [id number] DOB: 14-Nov-1933 Today's Date: 01/17/2017    History of Present Illness Mallory Rodriguez is a 83 y.o. female with medical history significant of hypertension, hyperlipidemia, TIA, pernicious anemia, MVP, hypogammaglobulinemia, angina pectoralis, who presents with right hip pain. after fall resulting in R intertrochanteric fx; Now s/p surgical fixation with intertrochanteric nail, PWB    PT Comments    Patient required max a to stand pivot transfer back to the bed this afternoon. She continues to do well not putting weight on her right lower extremity. She would benefit from rehab at a SNF.   Follow Up Recommendations  SNF     Equipment Recommendations  Rolling walker with 5" wheels;3in1 (PT)    Recommendations for Other Services       Precautions / Restrictions Precautions Precautions: Fall Precaution Comments: Near syncope sitting EOB Restrictions Weight Bearing Restrictions: Yes RLE Weight Bearing: Partial weight bearing RLE Partial Weight Bearing Percentage or Pounds: not specified    Mobility  Bed Mobility Overal bed mobility: Needs Assistance Bed Mobility: Sit to Supine     Supine to sit: +2 for physical assistance;Mod assist     General bed mobility comments: max a of PT and CNA to control riight lower extrmity back into bed.   Transfers Overall transfer level: Needs assistance   Transfers: Stand Pivot Transfers   Stand pivot transfers: +2 physical assistance;Max assist       General transfer comment: +2 max a to transfer to the chair. Max verbal and tactile cuing to not put weight on her right lower extremity.  No syncope noted with transfer  Ambulation/Gait                 Stairs            Wheelchair Mobility    Modified Rankin (Stroke Patients Only)       Balance                                    Cognition Arousal/Alertness:  Awake/alert Behavior During Therapy: WFL for tasks assessed/performed Overall Cognitive Status: Impaired/Different from baseline Area of Impairment: Problem solving       Following Commands: Follows one step commands with increased time     Problem Solving: Slow processing;Decreased initiation;Requires verbal cues;Difficulty sequencing General Comments: Difficulty counting reps with therex    Exercises Total Joint Exercises Ankle Circles/Pumps: 20 reps Quad Sets: AROM;Right;10 reps General Exercises - Upper Extremity Shoulder Flexion: AAROM;AROM;Both;10 reps;Right;Seated Shoulder Extension: AROM;AAROM;Right;Both;10 reps;Seated Shoulder ABduction: AROM;AAROM;Right;Both;10 reps;Seated Shoulder ADduction: AROM;AAROM;Right;Both;10 reps;Seated    General Comments        Pertinent Vitals/Pain Pain Assessment: 0-10 Pain Score: 8  Faces Pain Scale: Hurts whole lot Pain Location: R hip with movement  Pain Descriptors / Indicators: Aching;Grimacing;Guarding Pain Intervention(s): Monitored during session;Repositioned    Home Living                      Prior Function            PT Goals (current goals can now be found in the care plan section) Acute Rehab PT Goals Patient Stated Goal: go to SNF PT Goal Formulation: With patient Time For Goal Achievement: 01/29/17 Potential to Achieve Goals: Fair Progress towards PT goals: Progressing toward goals    Frequency    Min 3X/week      PT  Plan Current plan remains appropriate    Co-evaluation             End of Session   Activity Tolerance: Patient tolerated treatment well Patient left: in bed;with call bell/phone within reach     Time: 1340-1350 PT Time Calculation (min) (ACUTE ONLY): 10 min  Charges:  $Therapeutic Activity: 8-22 mins                    G Codes:      Carney Living PT DPT  01/17/2017, 3:57 PM

## 2017-01-18 ENCOUNTER — Encounter (HOSPITAL_COMMUNITY): Payer: Self-pay | Admitting: General Practice

## 2017-01-18 LAB — CBC
HCT: 21.4 % — ABNORMAL LOW (ref 36.0–46.0)
Hemoglobin: 7.2 g/dL — ABNORMAL LOW (ref 12.0–15.0)
MCH: 31.6 pg (ref 26.0–34.0)
MCHC: 33.6 g/dL (ref 30.0–36.0)
MCV: 93.9 fL (ref 78.0–100.0)
PLATELETS: 169 10*3/uL (ref 150–400)
RBC: 2.28 MIL/uL — AB (ref 3.87–5.11)
RDW: 15.7 % — ABNORMAL HIGH (ref 11.5–15.5)
WBC: 7.2 10*3/uL (ref 4.0–10.5)

## 2017-01-18 LAB — BASIC METABOLIC PANEL
Anion gap: 9 (ref 5–15)
BUN: 27 mg/dL — ABNORMAL HIGH (ref 6–20)
CALCIUM: 8.4 mg/dL — AB (ref 8.9–10.3)
CO2: 29 mmol/L (ref 22–32)
Chloride: 99 mmol/L — ABNORMAL LOW (ref 101–111)
Creatinine, Ser: 0.82 mg/dL (ref 0.44–1.00)
GLUCOSE: 98 mg/dL (ref 65–99)
POTASSIUM: 3.4 mmol/L — AB (ref 3.5–5.1)
SODIUM: 137 mmol/L (ref 135–145)

## 2017-01-18 MED ORDER — POTASSIUM CHLORIDE CRYS ER 20 MEQ PO TBCR
40.0000 meq | EXTENDED_RELEASE_TABLET | Freq: Once | ORAL | Status: AC
  Administered 2017-01-18: 40 meq via ORAL
  Filled 2017-01-18: qty 2

## 2017-01-18 NOTE — Progress Notes (Signed)
LCSW followed up with bed offers with daughter and husband along with patient in room. Bed choice: [facility]. LCSW called to hold bed for patient.  Awaiting plan for patient and tentative DC when medically stable.  Lane Hacker, MSW Clinical Social Work: [employer] Coverage for :  [phone]

## 2017-01-18 NOTE — Progress Notes (Signed)
Physical Therapy Treatment Patient Details Name: Mallory Rodriguez MRN: [id number] DOB: Feb 27, 1933 Today's Date: 01/18/2017    History of Present Illness Mallory Rodriguez is a 83 y.o. female with medical history significant of hypertension, hyperlipidemia, TIA, pernicious anemia, MVP, hypogammaglobulinemia, angina pectoralis, who presents with right hip pain. after fall resulting in R intertrochanteric fx; Now s/p surgical fixation with intertrochanteric nail, PWB    PT Comments    Patient is making gradual progress toward mobility goals and able to take a few steps today. Continue to progress as tolerated with anticipated d/c to SNF for further skilled PT services.    Follow Up Recommendations  SNF     Equipment Recommendations  Rolling walker with 5" wheels;3in1 (PT)    Recommendations for Other Services       Precautions / Restrictions Precautions Precautions: Fall Restrictions Weight Bearing Restrictions: Yes RLE Weight Bearing: Partial weight bearing RLE Partial Weight Bearing Percentage or Pounds: not specified    Mobility  Bed Mobility Overal bed mobility: Needs Assistance Bed Mobility: Supine to Sit     Supine to sit: HOB elevated;Mod assist     General bed mobility comments: assist to bring R LE to EOB, elevate trunk into sitting, and scoot hips to EOB with use of bed pad; cues for sequencing and technique; use of bed rails  Transfers Overall transfer level: Needs assistance Equipment used: Rolling walker (2 wheeled) Transfers: Stand Pivot Transfers;Sit to/from Stand Sit to Stand: Min assist;+2 physical assistance Stand pivot transfers: +2 physical assistance;Mod assist       General transfer comment: cues for hand placement; assist to come into standing and to weight shift and steady upon stand; after taking a few steps forward pt pivoted to recliner with mod +2 for balance, management of rW, and weight shifting to maitnain PWB  status  Ambulation/Gait Ambulation/Gait assistance: Mod assist;+2 physical assistance Ambulation Distance (Feet): 1 Feet (short steps forward) Assistive device: Rolling walker (2 wheeled) Gait Pattern/deviations: Step-to pattern;Decreased stance time - right;Decreased step length - left;Decreased step length - right;Decreased weight shift to right;Antalgic     General Gait Details: assist and cues for posture, use of AD, and weight shifting   Stairs            Wheelchair Mobility    Modified Rankin (Stroke Patients Only)       Balance                                    Cognition Arousal/Alertness: Awake/alert Behavior During Therapy: WFL for tasks assessed/performed                        Exercises Total Joint Exercises Quad Sets: AROM;Both;10 reps Gluteal Sets: AROM;10 reps Heel Slides: AAROM;Right;10 reps Hip ABduction/ADduction: AAROM;Right;10 reps    General Comments        Pertinent Vitals/Pain Pain Assessment: 0-10 Pain Score: 5  Pain Location: R hip with movement  Pain Descriptors / Indicators: Aching;Grimacing;Guarding Pain Intervention(s): Limited activity within patient's tolerance;Monitored during session;Premedicated before session;Repositioned    Home Living Family/patient expects to be discharged to:: Skilled nursing facility Living Arrangements: Spouse/significant other                  Prior Function            PT Goals (current goals can now be found in the care plan section) Acute Rehab PT  Goals Patient Stated Goal: go to SNF PT Goal Formulation: With patient Time For Goal Achievement: 01/29/17 Potential to Achieve Goals: Fair Progress towards PT goals: Progressing toward goals    Frequency    Min 3X/week      PT Plan Current plan remains appropriate    Co-evaluation             End of Session Equipment Utilized During Treatment: Gait belt Activity Tolerance: Patient tolerated  treatment well Patient left: with call bell/phone within reach;in chair;with family/visitor present;with chair alarm set     Time: 1035-1101 PT Time Calculation (min) (ACUTE ONLY): 26 min  Charges:  $Therapeutic Exercise: 8-22 mins $Therapeutic Activity: 8-22 mins                    G Codes:      Salina April, PTA Pager: [phone]   01/18/2017, 11:54 AM

## 2017-01-18 NOTE — Care Management Important Message (Signed)
Important Message  Patient Details  Name: Mallory Rodriguez MRN: [id number] Date of Birth: August 10, 1933   Medicare Important Message Given:       Orbie Pyo 01/18/2017, 1:19 PM

## 2017-01-18 NOTE — Progress Notes (Signed)
PROGRESS NOTE  Mallory Rodriguez  [id number] DOB: 12-10-1933 DOA: 01/13/2017  PCP: [name], MD   Brief Narrative:  83 y.o. female with hypertension, hyperlipidemia, TIA, pernicious anemia, MVP, hypogammaglobulinemia, angina pectoralis, who presented after an episode of fall when trying to go to bed. She fell on her right side and imeditely thereafter had right hip pain and was not able to bear weight.   Assessment & Plan:   Principal Problem:   Closed right hip fracture, initial encounter ([place]) - s/p hip repair, post op day #4 - pt reports feeling better, wants to eat  - pt will likely need SNF, in agreement, possible d/c in AM - provide analgesia as needed     Acute post op blood loss anemia - Hg drop from 12 --> 7.5 --> 6.5 --> 7.1 (had one U PRBC transfused 1/17) - CBC in AM    Acute kidney injury - from post op anemia  - resolved  - BMP in AM  Active Problems:   HYPERTENSION, BENIGN - reasonable inpatient control     Thrombocytopenia - maybe some post op blood loss - better this AM and WNL this AM  - CBC in AM    Hypokalemia - mild, supplement - BMP in AM    Obesity  - Body mass index is 30.23 kg/m.  DVT prophylaxis: Aspirin 325 mg PO QD Code Status: Full  Family Communication: Patient at bedside, daughter who is POA over the phone  Disposition Plan: SNF in 1-2 days based on Hg stability   Consultants:   Ortho   Procedures:   Right hip repair 1/15 --> Dr. Alvan Dame   Antimicrobials:   None   Subjective: No events overnight.   Objective: Vitals:   01/17/17 0330 01/17/17 1627 01/17/17 2256 01/18/17 0539  BP: (!) 135/58 137/61 (!) 123/43 125/70  Pulse: 87 83 79 84  Resp: 15 17 16 16   Temp: 98 F (36.7 C) 98.6 F (37 C) 98.6 F (37 C) 98.1 F (36.7 C)  TempSrc: Oral Oral Oral Oral  SpO2:  95% 96% 95%  Weight:      Height:        Intake/Output Summary (Last 24 hours) at 01/18/17 1453 Last data filed at 01/18/17 1100  Gross per 24  hour  Intake              480 ml  Output              350 ml  Net              130 ml   Filed Weights   01/13/17 2323  Weight: 72.6 kg (160 lb)    Examination:  General exam: Appears calm and comfortable  Respiratory system: Clear to auscultation. Respiratory effort normal. Cardiovascular system: S1 & S2 heard, RRR. No rubs, gallops or clicks. No pedal edema. Gastrointestinal system: Abdomen is nondistended, soft and nontender. No organomegaly or masses felt.  Central nervous system: Alert and oriented. No focal neurological deficits.  Data Reviewed: I have personally reviewed following labs and imaging studies  CBC:  Recent Labs Lab 01/13/17 2337 01/15/17 0336 01/16/17 0412 01/17/17 0322 01/18/17 0345  WBC 14.0* 8.9 9.8 8.6 7.2  NEUTROABS 11.9*  --   --   --   --   HGB 12.0 7.5* 6.1* 7.3* 7.2*  HCT 36.3 22.6* 18.4* 21.8* 21.4*  MCV 94.0 93.8 94.4 92.8 93.9  PLT 211 148* 130* 134* [id number]   Basic Metabolic Panel:  Recent Labs Lab 01/13/17 2337 01/15/17 0336 01/16/17 0412 01/18/17 0345  NA 138 137 136 137  K 3.4* 4.1 3.7 3.4*  CL 102 103 102 99*  CO2 21* 25 26 29   GLUCOSE 152* 178* 110* 98  BUN 27* 26* 31* 27*  CREATININE 0.97 1.10* 1.20* 0.82  CALCIUM 9.0 8.0* 8.2* 8.4*   Coagulation Profile:  Recent Labs Lab 01/13/17 2337  INR 0.96  \ Recent Results (from the past 240 hour(s))  Surgical pcr screen     Status: None   Collection Time: 01/14/17  3:23 AM  Result Value Ref Range Status   MRSA, PCR NEGATIVE NEGATIVE Final   Staphylococcus aureus NEGATIVE NEGATIVE Final    Comment:        The Xpert SA Assay (FDA approved for NASAL specimens in patients over 42 years of age), is one component of a comprehensive surveillance program.  Test performance has been validated by [facility] for patients greater than or equal to 86 year old. It is not intended to diagnose infection nor to guide or monitor treatment.       Radiology Studies: No results  found.    Scheduled Meds: . sodium chloride   Intravenous Once  . sodium chloride   Intravenous Once  . acetaminophen  650 mg Oral TID  . acidophilus  1 capsule Oral Daily  . aspirin EC  325 mg Oral Q breakfast  . calcium-vitamin D  1 tablet Oral Daily  . [START ON 02/28/2017] cyanocobalamin  1,000 mcg Intramuscular Q90 days  . docusate sodium  100 mg Oral BID  . feeding supplement (ENSURE ENLIVE)  237 mL Oral BID BM  . ferrous sulfate  325 mg Oral BID WC  . hydrochlorothiazide  25 mg Oral Daily  . omega-3 acid ethyl esters  1 g Oral Daily   Continuous Infusions: . lactated ringers 10 mL/hr (01/14/17 1351)     LOS: 4 days   Time spent: 20 minutes   Faye Ramsay, MD [facility] Pager [phone]  If 7PM-7AM, please contact night-coverage [email or web address] Password [email or web address] 01/18/2017, 2:53 PM

## 2017-01-19 LAB — CBC
HEMATOCRIT: 26.3 % — AB (ref 36.0–46.0)
Hemoglobin: 8.6 g/dL — ABNORMAL LOW (ref 12.0–15.0)
MCH: 31.3 pg (ref 26.0–34.0)
MCHC: 32.7 g/dL (ref 30.0–36.0)
MCV: 95.6 fL (ref 78.0–100.0)
Platelets: 216 10*3/uL (ref 150–400)
RBC: 2.75 MIL/uL — AB (ref 3.87–5.11)
RDW: 15.6 % — ABNORMAL HIGH (ref 11.5–15.5)
WBC: 8.7 10*3/uL (ref 4.0–10.5)

## 2017-01-19 LAB — BASIC METABOLIC PANEL
Anion gap: 10 (ref 5–15)
BUN: 22 mg/dL — AB (ref 6–20)
CO2: 26 mmol/L (ref 22–32)
CREATININE: 0.84 mg/dL (ref 0.44–1.00)
Calcium: 8.8 mg/dL — ABNORMAL LOW (ref 8.9–10.3)
Chloride: 102 mmol/L (ref 101–111)
GFR calc non Af Amer: >60 mL/min (ref >60)
Glucose, Bld: 95 mg/dL (ref 65–99)
POTASSIUM: 3.8 mmol/L (ref 3.5–5.1)
SODIUM: 138 mmol/L (ref 135–145)

## 2017-01-19 MED ORDER — FERROUS SULFATE 325 (65 FE) MG PO TABS: 325.0000 mg | ORAL_TABLET | Freq: Two times a day (BID) | ORAL | 3 refills | Status: DC

## 2017-01-19 MED ORDER — ASPIRIN 325 MG PO TBEC: 325.0000 mg | DELAYED_RELEASE_TABLET | Freq: Every day | ORAL | 0 refills | Status: DC

## 2017-01-19 NOTE — Discharge Instructions (Signed)
Hip Fracture A hip fracture is a fracture of the upper part of your thigh bone (femur). What are the causes? A hip fracture is caused by a direct blow to the side of your hip. This is usually the result of a fall but can occur in other circumstances, such as an automobile accident. What increases the risk? There is an increased risk of hip fractures in people with:  An unsteady walking pattern (gait) and those with conditions that contribute to poor balance, such as Parkinson's disease or dementia.  Osteopenia and osteoporosis.  Cancer that spreads to the leg bones.  Certain metabolic diseases.  What are the signs or symptoms? Symptoms of hip fracture include:  Pain over the injured hip.  Inability to put weight on the leg in which the fracture occurred (although, some patients are able to walk after a hip fracture).  Toes and foot of the affected leg point outward when you lie down.  How is this diagnosed? A physical exam can determine if a hip fracture is likely to have occurred. X-ray exams are needed to confirm the fracture and to look for other injuries. The X-ray exam can help to determine the type of hip fracture. Rarely, the fracture is not visible on an X-ray image and a CT scan or MRI will have to be done. How is this treated? The treatment for a fracture is usually surgery. This means using a screw, nail, or rod to hold the bones in place. Follow these instructions at home: Take all medicines as directed by your health care provider. Contact a health care provider if: Pain continues, even after taking pain medicine. This information is not intended to replace advice given to you by your health care provider. Make sure you discuss any questions you have with your health care provider. Document Released: 12/17/2005 Document Revised: 05/24/2016 Document Reviewed: 07/29/2013 Elsevier Interactive Patient Education  2017 Elsevier Inc.  

## 2017-01-19 NOTE — Discharge Summary (Signed)
Physician Discharge Summary  Mallory Rodriguez [id number] DOB: [birth year] DOA: 01/13/2017  PCP: [name], MD  Admit date: 01/13/2017 Discharge date: 01/19/2017  Recommendations for Outpatient Follow-up:  1. Pt will need to follow up with PCP in 1-2 weeks post discharge 2. Please obtain BMP to evaluate electrolytes and kidney function 3. Please also check CBC to evaluate Hg and Hct levels 4. Pt to see Dr. Alvan Dame in 2 weeks post discharge for follow up 5. Pt discharge on Aspirin 325 mg pO QD for DVT prophylaxis and she will need to take this for 30 days, after completion can resume 81 mg PO QD  Discharge Diagnoses:  Principal Problem:   Closed right hip fracture, initial encounter ([place]) Active Problems:   Pernicious anemia   HYPERTENSION, BENIGN   CVA (cerebral vascular accident) ([place])   Hypokalemia  Discharge Condition: Stable  Diet recommendation: Heart healthy diet discussed in details    Brief Narrative:  83 y.o.femalewith hypertension, hyperlipidemia, TIA, pernicious anemia, MVP, hypogammaglobulinemia, angina pectoralis, who presented after an episode of fall when trying to go to bed. She fell on her right side and imeditely thereafter had right hip pain and was not able to bear weight.   Assessment & Plan:   Principal Problem:   Closed right hip fracture, initial encounter ([place]) - s/p hip repair, post op day #5 - pt reports feeling better, wants to eat  - d/c SNF today     Acute post op blood loss anemia - Hg drop from 12 --> 7.5 --> 6.5 --> 7.1 (had one U PRBC transfused 1/17) --> 8.6 - no eliquis, DVT prophylaxis changed to Aspirin 325 mg PO QD and pt to tale for 30 days, after completion resume previous regimen Aspirin 81 mg PO QD    Acute kidney injury - from post op anemia  - resolved   Active Problems:   HYPERTENSION, BENIGN - reasonable inpatient control     Thrombocytopenia - maybe some post op blood loss - better this AM and WNL this AM      Hypokalemia - mild, supplemented    Obesity  - Body mass index is 30.23 kg/m.  DVT prophylaxis: Aspirin 325 mg PO QD Code Status: Full  Family Communication: Patient at bedside, daughter who is POA over the phone  Disposition Plan: SNF   Consultants:   Ortho   Procedures:   Right hip repair 1/15 --> Dr. Alvan Dame   Antimicrobials:   None    Procedures/Studies: Dg Chest 1 View  Result Date: 01/14/2017 CLINICAL DATA:  Fall with right hip pain. EXAM: CHEST 1 VIEW COMPARISON:  Chest CT 02/22/2015 FINDINGS: The cardiomediastinal contours are normal. The heart is at the upper limits of normal in size. Subsegmental atelectasis or scarring at the left lung base. Pulmonary vasculature is normal. No consolidation, pleural effusion, or pneumothorax. No acute osseous abnormalities are seen. IMPRESSION: Subsegmental left basilar atelectasis. Otherwise no acute abnormality. Electronically Signed   By: Jeb Levering M.D.   On: 01/14/2017 00:36   Ct Head Wo Contrast  Result Date: 01/15/2017 CLINICAL DATA:  Confusion. Pt denies headache. Pt states she did have a dizzy spell today. EXAM: CT HEAD WITHOUT CONTRAST TECHNIQUE: Contiguous axial images were obtained from the base of the skull through the vertex without intravenous contrast. COMPARISON:  Head CT dated 06/11/2016. FINDINGS: Brain: Again noted is mild generalized age related parenchymal atrophy with commensurate dilatation of the ventricles and sulci. Dystrophic calcifications within the left basal ganglia are stable,  likely sequela of previous infarct. Additional physiologic calcifications again noted within each basal ganglia region. Mild chronic small vessel ischemic changes noted within the bilateral periventricular white matter regions. There is no mass, hemorrhage, edema or other evidence of acute parenchymal abnormality. No extra-axial hemorrhage. Vascular: There are chronic calcified atherosclerotic changes of the large  vessels at the skull base. No unexpected hyperdense vessel. Skull: Normal. Negative for fracture or focal lesion. Sinuses/Orbits: Chronic appearing mucosal thickening within the upper maxillary sinuses bilaterally. Other: None. IMPRESSION: No acute findings.  No intracranial mass, hemorrhage or edema. Chronic ischemic changes, as detailed above. Electronically Signed   By: Franki Cabot M.D.   On: 01/15/2017 17:17   Dg Knee Complete 4 Views Right  Result Date: 01/14/2017 CLINICAL DATA:  Fall with right hip and knee pain. EXAM: RIGHT KNEE - COMPLETE 4+ VIEW COMPARISON:  None. FINDINGS: No acute fracture or subluxation. Tricompartmental osteoarthritis with peripheral spurring. Spurring of the tibial spines. There is narrowing of the lateral tibiofemoral joint space. Trace joint effusion. Quadriceps tendon enthesopathy. IMPRESSION: Moderate tricompartmental osteoarthritis without acute fracture or subluxation. Electronically Signed   By: Jeb Levering M.D.   On: 01/14/2017 00:37   Dg C-arm 1-60 Min  Result Date: 01/14/2017 CLINICAL DATA:  Open reduction internal fixation right intertrochanteric fracture EXAM: OPERATIVE right HIP (WITH PELVIS IF PERFORMED) 3 VIEWS TECHNIQUE: Fluoroscopic spot image(s) were submitted for interpretation post-operatively. COMPARISON:  01/13/2017 FINDINGS: The patient is status post open reduction internal fixation of right femoral intertrochanteric fracture. There is intramedullary rod and metallic fixation pin noted along the right femoral neck. There is anatomic alignment. IMPRESSION: Status post open reduction internal fixation of right intertrochanteric proximal femoral fracture. Intramedullary rod and locking pin noted in proximal right femur. There is anatomic alignment. Fluoroscopy time was 1 minute 13 seconds. Please see the operative report Electronically Signed   By: Lahoma Crocker M.D.   On: 01/14/2017 16:46   Dg Hip Operative Unilat W Or W/o Pelvis Right  Result  Date: 01/14/2017 CLINICAL DATA:  Open reduction internal fixation right intertrochanteric fracture EXAM: OPERATIVE right HIP (WITH PELVIS IF PERFORMED) 3 VIEWS TECHNIQUE: Fluoroscopic spot image(s) were submitted for interpretation post-operatively. COMPARISON:  01/13/2017 FINDINGS: The patient is status post open reduction internal fixation of right femoral intertrochanteric fracture. There is intramedullary rod and metallic fixation pin noted along the right femoral neck. There is anatomic alignment. IMPRESSION: Status post open reduction internal fixation of right intertrochanteric proximal femoral fracture. Intramedullary rod and locking pin noted in proximal right femur. There is anatomic alignment. Fluoroscopy time was 1 minute 13 seconds. Please see the operative report Electronically Signed   By: Lahoma Crocker M.D.   On: 01/14/2017 16:46   Dg Hip Unilat With Pelvis 2-3 Views Right  Result Date: 01/14/2017 CLINICAL DATA:  Post fall with right hip pain. EXAM: DG HIP (WITH OR WITHOUT PELVIS) 2-3V RIGHT COMPARISON:  None. FINDINGS: Comminuted displaced intertrochanteric right hip fracture involving the lesser and greater trochanters. There proximal migration of the femoral shaft. Femoral head remains seated in the acetabulum. No additional acute fracture. Pubic symphysis and sacroiliac joints are congruent. Pubic rami appear intact. IMPRESSION: Comminuted displaced intertrochanteric right hip fracture. Electronically Signed   By: Jeb Levering M.D.   On: 01/14/2017 00:38    Discharge Exam: Vitals:   01/18/17 2056 01/19/17 0424  BP: (!) 145/45 (!) 161/65  Pulse: 87 75  Resp:    Temp: 98.1 F (36.7 C) 97.9 F (36.6 C)  Vitals:   01/18/17 0539 01/18/17 1500 01/18/17 2056 01/19/17 0424  BP: 125/70 (!) 131/43 (!) 145/45 (!) 161/65  Pulse: 84 85 87 75  Resp: 16     Temp: 98.1 F (36.7 C) 98.7 F (37.1 C) 98.1 F (36.7 C) 97.9 F (36.6 C)  TempSrc: Oral Oral Oral Oral  SpO2: 95% 96% 98% 96%   Weight:      Height:        General: Pt is alert, follows commands appropriately, not in acute distress Cardiovascular: Regular rate and rhythm, S1/S2 +, no rubs, no gallops Respiratory: Clear to auscultation bilaterally, no wheezing, no crackles, no rhonchi Abdominal: Soft, non tender, non distended, bowel sounds +, no guarding Extremities: no edema, no cyanosis, pulses palpable bilaterally DP and PT Neuro: Grossly nonfocal  Discharge Instructions  Discharge Instructions    Diet - low sodium heart healthy    Complete by:  As directed    Increase activity slowly    Complete by:  As directed    Partial weight bearing    Complete by:  As directed    50%     Allergies as of 01/19/2017      Reactions   Bee Venom Anaphylaxis   Contrast Media [iodinated Diagnostic Agents] Hives   Lipitor [atorvastatin] Swelling   Ace Inhibitors Swelling   Hydrocodone Other (See Comments)   Reaction:  Unknown    Macrodantin Other (See Comments)   Reaction:  Chest pain    Neosporin [neomycin-bacitracin Zn-polymyx] Other (See Comments)   Reaction:  Burning    Sulfa Antibiotics Hives   Epinephrine Palpitations   Prednisone Rash, Other (See Comments)   Reaction:  Dizziness and headache       Medication List    STOP taking these medications   amoxicillin 500 MG capsule Commonly known as:  AMOXIL   aspirin 81 MG tablet Replaced by:  aspirin 325 MG EC tablet     TAKE these medications   acetaminophen 650 MG CR tablet Commonly known as:  TYLENOL Take 650 mg by mouth 3 (three) times daily.   ALIGN 4 MG Caps Take 1 capsule by mouth daily.   aspirin 325 MG EC tablet Take 1 tablet (325 mg total) by mouth daily with breakfast. Start taking on:  01/20/2017 Replaces:  aspirin 81 MG tablet   CALCIUM-VITAMIN D-VITAMIN K PO Take 1 each by mouth daily.   clopidogrel 75 MG tablet Commonly known as:  PLAVIX TAKE 1 TABLET BY MOUTH EVERY DAY   cyanocobalamin 1000 MCG/ML injection Commonly  known as:  (VITAMIN B-12) Inject 1,000 mcg into the muscle every 3 (three) months.   EPINEPHrine 0.15 MG/0.3ML injection Commonly known as:  EPIPEN JR INJECT ONE SYRINGEFUL INTRAMUSCULARLY AS NEEDED FOR  ANAPHYLAXIS   ferrous sulfate 325 (65 FE) MG tablet Take 1 tablet (325 mg total) by mouth 2 (two) times daily with a meal.   Fish Oil 1200 MG Caps Take 1 capsule by mouth daily.   hydrochlorothiazide 25 MG tablet Commonly known as:  HYDRODIURIL TAKE ONE TABLET BY MOUTH DAILY.   lovastatin 40 MG tablet Commonly known as:  MEVACOR Take 1 tablet (40 mg total) by mouth at bedtime.   nitroGLYCERIN 0.4 MG/SPRAY spray Commonly known as:  NITROLINGUAL PLACE ONE SPRAY UNDER THE TONGUE EVERY FIVE MINUTES AS NEEDED FOR CHEST PAIN   oxyCODONE 5 MG immediate release tablet Commonly known as:  Oxy IR/ROXICODONE Take 1-2 tablets (5-10 mg total) by mouth every 6 (six) hours as  needed for breakthrough pain ((for MODERATE breakthrough pain)).   polyethylene glycol packet Commonly known as:  MIRALAX / GLYCOLAX Take 17 g by mouth daily as needed for mild constipation.   SYSTANE OP Apply 1 drop to eye as needed (for dry eyes).       Contact information for follow-up providers    Mauri Pole, MD Follow up in 2 week(s).   Specialty:  Orthopedic Surgery Why:  wound check and x-rays Contact information: [address] [place] [address] [phone]        Mauri Pole, MD Follow up.   Specialty:  Orthopedic Surgery Contact information: [address] [place] [address]            Contact information for after-discharge care    Destination    [facility] Follow up.   Specialty:  [place] information: [address] [place] [phone]                   The results of significant diagnostics from this hospitalization (including imaging, microbiology,  ancillary and laboratory) are listed below for reference.     Microbiology: Recent Results (from the past 240 hour(s))  Surgical pcr screen     Status: None   Collection Time: 01/14/17  3:23 AM  Result Value Ref Range Status   MRSA, PCR NEGATIVE NEGATIVE Final   Staphylococcus aureus NEGATIVE NEGATIVE Final    Comment:        The Xpert SA Assay (FDA approved for NASAL specimens in patients over 71 years of age), is one component of a comprehensive surveillance program.  Test performance has been validated by [facility] for patients greater than or equal to 94 year old. It is not intended to diagnose infection nor to guide or monitor treatment.      Labs: Basic Metabolic Panel:  Recent Labs Lab 01/13/17 2337 01/15/17 0336 01/16/17 0412 01/18/17 0345 01/19/17 0434  NA 138 137 136 137 138  K 3.4* 4.1 3.7 3.4* 3.8  CL 102 103 102 99* 102  CO2 21* 25 26 29 26   GLUCOSE 152* 178* 110* 98 95  BUN 27* 26* 31* 27* 22*  CREATININE 0.97 1.10* 1.20* 0.82 0.84  CALCIUM 9.0 8.0* 8.2* 8.4* 8.8*   CBC:  Recent Labs Lab 01/13/17 2337 01/15/17 0336 01/16/17 0412 01/17/17 0322 01/18/17 0345 01/19/17 0434  WBC 14.0* 8.9 9.8 8.6 7.2 8.7  NEUTROABS 11.9*  --   --   --   --   --   HGB 12.0 7.5* 6.1* 7.3* 7.2* 8.6*  HCT 36.3 22.6* 18.4* 21.8* 21.4* 26.3*  MCV 94.0 93.8 94.4 92.8 93.9 95.6  PLT 211 148* 130* 134* 169 216    BNP (last 3 results)  Recent Labs  01/13/17 2337  BNP 37.3   SIGNED: Time coordinating discharge:  30 minutes  Faye Ramsay, MD  [facility] 01/19/2017, 9:53 AM Pager [phone]  If 7PM-7AM, please contact night-coverage [email or web address] Password [email or web address]

## 2017-01-20 DIAGNOSIS — Z7409 Other reduced mobility: Secondary | ICD-10-CM | POA: Diagnosis not present

## 2017-01-20 DIAGNOSIS — R531 Weakness: Secondary | ICD-10-CM | POA: Diagnosis not present

## 2017-01-20 DIAGNOSIS — I10 Essential (primary) hypertension: Secondary | ICD-10-CM | POA: Diagnosis not present

## 2017-01-20 DIAGNOSIS — S72001A Fracture of unspecified part of neck of right femur, initial encounter for closed fracture: Secondary | ICD-10-CM | POA: Diagnosis not present

## 2017-01-20 DIAGNOSIS — R2241 Localized swelling, mass and lump, right lower limb: Secondary | ICD-10-CM | POA: Diagnosis not present

## 2017-01-20 DIAGNOSIS — R198 Other specified symptoms and signs involving the digestive system and abdomen: Secondary | ICD-10-CM | POA: Diagnosis not present

## 2017-01-20 DIAGNOSIS — S72001D Fracture of unspecified part of neck of right femur, subsequent encounter for closed fracture with routine healing: Secondary | ICD-10-CM | POA: Diagnosis not present

## 2017-01-20 DIAGNOSIS — S8990XA Unspecified injury of unspecified lower leg, initial encounter: Secondary | ICD-10-CM | POA: Diagnosis not present

## 2017-01-20 DIAGNOSIS — S72141D Displaced intertrochanteric fracture of right femur, subsequent encounter for closed fracture with routine healing: Secondary | ICD-10-CM | POA: Diagnosis not present

## 2017-01-20 DIAGNOSIS — M6250 Muscle wasting and atrophy, not elsewhere classified, unspecified site: Secondary | ICD-10-CM | POA: Diagnosis not present

## 2017-01-20 DIAGNOSIS — I69351 Hemiplegia and hemiparesis following cerebral infarction affecting right dominant side: Secondary | ICD-10-CM | POA: Diagnosis not present

## 2017-01-20 DIAGNOSIS — D6489 Other specified anemias: Secondary | ICD-10-CM | POA: Diagnosis not present

## 2017-01-20 DIAGNOSIS — Z4789 Encounter for other orthopedic aftercare: Secondary | ICD-10-CM | POA: Diagnosis not present

## 2017-01-20 DIAGNOSIS — D62 Acute posthemorrhagic anemia: Secondary | ICD-10-CM | POA: Diagnosis not present

## 2017-01-20 DIAGNOSIS — M1990 Unspecified osteoarthritis, unspecified site: Secondary | ICD-10-CM | POA: Diagnosis not present

## 2017-01-20 NOTE — Clinical Social Work Note (Addendum)
Clinical Social Worker facilitated patient discharge including contacting patient family and facility to confirm patient discharge plans.  Clinical information faxed to facility and family agreeable with plan.  CSW arranged ambulance transport via [place] (1:00) to [facility] .  RN to call [phone] for report prior to discharge. Patient will be going to room 217B.  Clinical Social Worker will sign off for now as social work intervention is no longer needed. Please consult [place] again if new need arises.  [address], [place]

## 2017-01-20 NOTE — Plan of Care (Signed)
Problem: Tissue Perfusion: Goal: Risk factors for ineffective tissue perfusion will decrease Outcome: Progressing No s/s of dvt noted  Problem: Bowel/Gastric: Goal: Will not experience complications related to bowel motility Outcome: Progressing Denies gastric and bowel issues, last BM on 01/19/2017  Problem: Physical Regulation: Goal: Will remain free from infection Outcome: Progressing No s/s of infection noted Goal: Postoperative complications will be avoided or minimized Outcome: Progressing No post op complications noted  Problem: Pain Management: Goal: Pain level will decrease Outcome: Progressing Medicated with schedule Tylenol with full relief, denies pain at present moment

## 2017-01-20 NOTE — Discharge Summary (Signed)
Physician Discharge Summary  Mallory Rodriguez [id number] DOB: 1933-09-26 DOA: 01/13/2017  PCP: [name], MD  Admit date: 01/13/2017 Discharge date: 01/20/2017  Recommendations for Outpatient Follow-up:  1. Pt will need to follow up with PCP in 1-2 weeks post discharge 2. Please obtain BMP to evaluate electrolytes and kidney function 3. Please also check CBC to evaluate Hg and Hct levels 4. Pt to see Dr. Alvan Dame in 2 weeks post discharge for follow up 5. Pt discharge on Aspirin 325 mg pO QD for DVT prophylaxis and she will need to take this for 30 days, after completion can resume 81 mg PO QD  Discharge Diagnoses:  Principal Problem:   Closed right hip fracture, initial encounter ([place]) Active Problems:   Pernicious anemia   HYPERTENSION, BENIGN   CVA (cerebral vascular accident) ([place])   Hypokalemia  Discharge Condition: Stable  Diet recommendation: Heart healthy diet discussed in details    Brief Narrative:  83 y.o.femalewith hypertension, hyperlipidemia, TIA, pernicious anemia, MVP, hypogammaglobulinemia, angina pectoralis, who presented after an episode of fall when trying to go to bed. She fell on her right side and imeditely thereafter had right hip pain and was not able to bear weight.   Assessment & Plan:   Principal Problem:   Closed right hip fracture, initial encounter ([place]) - s/p hip repair, post op day #5 - pt reports feeling better, wants to eat  - d/c SNF today     Acute post op blood loss anemia - Hg drop from 12 --> 7.5 --> 6.5 --> 7.1 (had one U PRBC transfused 1/17) --> 8.6 - no eliquis, DVT prophylaxis changed to Aspirin 325 mg PO QD and pt to tale for 30 days, after completion resume previous regimen Aspirin 81 mg PO QD    Acute kidney injury - from post op anemia  - resolved   Active Problems:   HYPERTENSION, BENIGN - reasonable inpatient control     Thrombocytopenia - maybe some post op blood loss - better this AM and WNL this AM      Hypokalemia - mild, supplemented    Obesity  - Body mass index is 30.23 kg/m.  DVT prophylaxis: Aspirin 325 mg PO QD Code Status: Full  Family Communication: Patient at bedside, daughter who is POA over the phone  Disposition Plan: SNF   Consultants:   Ortho   Procedures:   Right hip repair 1/15 --> Dr. Alvan Dame   Antimicrobials:   None    Procedures/Studies: Dg Chest 1 View  Result Date: 01/14/2017 CLINICAL DATA:  Fall with right hip pain. EXAM: CHEST 1 VIEW COMPARISON:  Chest CT 02/22/2015 FINDINGS: The cardiomediastinal contours are normal. The heart is at the upper limits of normal in size. Subsegmental atelectasis or scarring at the left lung base. Pulmonary vasculature is normal. No consolidation, pleural effusion, or pneumothorax. No acute osseous abnormalities are seen. IMPRESSION: Subsegmental left basilar atelectasis. Otherwise no acute abnormality. Electronically Signed   By: Jeb Levering M.D.   On: 01/14/2017 00:36   Ct Head Wo Contrast  Result Date: 01/15/2017 CLINICAL DATA:  Confusion. Pt denies headache. Pt states she did have a dizzy spell today. EXAM: CT HEAD WITHOUT CONTRAST TECHNIQUE: Contiguous axial images were obtained from the base of the skull through the vertex without intravenous contrast. COMPARISON:  Head CT dated 06/11/2016. FINDINGS: Brain: Again noted is mild generalized age related parenchymal atrophy with commensurate dilatation of the ventricles and sulci. Dystrophic calcifications within the left basal ganglia are stable,  likely sequela of previous infarct. Additional physiologic calcifications again noted within each basal ganglia region. Mild chronic small vessel ischemic changes noted within the bilateral periventricular white matter regions. There is no mass, hemorrhage, edema or other evidence of acute parenchymal abnormality. No extra-axial hemorrhage. Vascular: There are chronic calcified atherosclerotic changes of the large  vessels at the skull base. No unexpected hyperdense vessel. Skull: Normal. Negative for fracture or focal lesion. Sinuses/Orbits: Chronic appearing mucosal thickening within the upper maxillary sinuses bilaterally. Other: None. IMPRESSION: No acute findings.  No intracranial mass, hemorrhage or edema. Chronic ischemic changes, as detailed above. Electronically Signed   By: Franki Cabot M.D.   On: 01/15/2017 17:17   Dg Knee Complete 4 Views Right  Result Date: 01/14/2017 CLINICAL DATA:  Fall with right hip and knee pain. EXAM: RIGHT KNEE - COMPLETE 4+ VIEW COMPARISON:  None. FINDINGS: No acute fracture or subluxation. Tricompartmental osteoarthritis with peripheral spurring. Spurring of the tibial spines. There is narrowing of the lateral tibiofemoral joint space. Trace joint effusion. Quadriceps tendon enthesopathy. IMPRESSION: Moderate tricompartmental osteoarthritis without acute fracture or subluxation. Electronically Signed   By: Jeb Levering M.D.   On: 01/14/2017 00:37   Dg C-arm 1-60 Min  Result Date: 01/14/2017 CLINICAL DATA:  Open reduction internal fixation right intertrochanteric fracture EXAM: OPERATIVE right HIP (WITH PELVIS IF PERFORMED) 3 VIEWS TECHNIQUE: Fluoroscopic spot image(s) were submitted for interpretation post-operatively. COMPARISON:  01/13/2017 FINDINGS: The patient is status post open reduction internal fixation of right femoral intertrochanteric fracture. There is intramedullary rod and metallic fixation pin noted along the right femoral neck. There is anatomic alignment. IMPRESSION: Status post open reduction internal fixation of right intertrochanteric proximal femoral fracture. Intramedullary rod and locking pin noted in proximal right femur. There is anatomic alignment. Fluoroscopy time was 1 minute 13 seconds. Please see the operative report Electronically Signed   By: Lahoma Crocker M.D.   On: 01/14/2017 16:46   Dg Hip Operative Unilat W Or W/o Pelvis Right  Result  Date: 01/14/2017 CLINICAL DATA:  Open reduction internal fixation right intertrochanteric fracture EXAM: OPERATIVE right HIP (WITH PELVIS IF PERFORMED) 3 VIEWS TECHNIQUE: Fluoroscopic spot image(s) were submitted for interpretation post-operatively. COMPARISON:  01/13/2017 FINDINGS: The patient is status post open reduction internal fixation of right femoral intertrochanteric fracture. There is intramedullary rod and metallic fixation pin noted along the right femoral neck. There is anatomic alignment. IMPRESSION: Status post open reduction internal fixation of right intertrochanteric proximal femoral fracture. Intramedullary rod and locking pin noted in proximal right femur. There is anatomic alignment. Fluoroscopy time was 1 minute 13 seconds. Please see the operative report Electronically Signed   By: Lahoma Crocker M.D.   On: 01/14/2017 16:46   Dg Hip Unilat With Pelvis 2-3 Views Right  Result Date: 01/14/2017 CLINICAL DATA:  Post fall with right hip pain. EXAM: DG HIP (WITH OR WITHOUT PELVIS) 2-3V RIGHT COMPARISON:  None. FINDINGS: Comminuted displaced intertrochanteric right hip fracture involving the lesser and greater trochanters. There proximal migration of the femoral shaft. Femoral head remains seated in the acetabulum. No additional acute fracture. Pubic symphysis and sacroiliac joints are congruent. Pubic rami appear intact. IMPRESSION: Comminuted displaced intertrochanteric right hip fracture. Electronically Signed   By: Jeb Levering M.D.   On: 01/14/2017 00:38   Discharge Exam: Vitals:   01/19/17 2015 01/20/17 0428  BP: (!) 157/63 (!) 138/43  Pulse: 83 82  Resp:    Temp: 97.8 F (36.6 C) 98.1 F (36.7 C)  Vitals:   01/18/17 2056 01/19/17 0424 01/19/17 2015 01/20/17 0428  BP: (!) 145/45 (!) 161/65 (!) 157/63 (!) 138/43  Pulse: 87 75 83 82  Resp:      Temp: 98.1 F (36.7 C) 97.9 F (36.6 C) 97.8 F (36.6 C) 98.1 F (36.7 C)  TempSrc: Oral Oral Oral Oral  SpO2: 98% 96% 99% 97%   Weight:      Height:        General: Pt is alert, follows commands appropriately, not in acute distress Cardiovascular: Regular rate and rhythm, S1/S2 +, no rubs, no gallops Respiratory: Clear to auscultation bilaterally, no wheezing, no crackles, no rhonchi Abdominal: Soft, non tender, non distended, bowel sounds +, no guarding Extremities: no edema, no cyanosis, pulses palpable bilaterally DP and PT Neuro: Grossly nonfocal  Discharge Instructions  Discharge Instructions    Diet - low sodium heart healthy    Complete by:  As directed    Increase activity slowly    Complete by:  As directed    Partial weight bearing    Complete by:  As directed    50%     Allergies as of 01/20/2017      Reactions   Bee Venom Anaphylaxis   Contrast Media [iodinated Diagnostic Agents] Hives   Lipitor [atorvastatin] Swelling   Ace Inhibitors Swelling   Hydrocodone Other (See Comments)   Reaction:  Unknown    Macrodantin Other (See Comments)   Reaction:  Chest pain    Neosporin [neomycin-bacitracin Zn-polymyx] Other (See Comments)   Reaction:  Burning    Sulfa Antibiotics Hives   Epinephrine Palpitations   Prednisone Rash, Other (See Comments)   Reaction:  Dizziness and headache       Medication List    STOP taking these medications   amoxicillin 500 MG capsule Commonly known as:  AMOXIL   aspirin 81 MG tablet Replaced by:  aspirin 325 MG EC tablet     TAKE these medications   acetaminophen 650 MG CR tablet Commonly known as:  TYLENOL Take 650 mg by mouth 3 (three) times daily.   ALIGN 4 MG Caps Take 1 capsule by mouth daily.   aspirin 325 MG EC tablet Take 1 tablet (325 mg total) by mouth daily with breakfast. Replaces:  aspirin 81 MG tablet   CALCIUM-VITAMIN D-VITAMIN K PO Take 1 each by mouth daily.   clopidogrel 75 MG tablet Commonly known as:  PLAVIX TAKE 1 TABLET BY MOUTH EVERY DAY   cyanocobalamin 1000 MCG/ML injection Commonly known as:  (VITAMIN  B-12) Inject 1,000 mcg into the muscle every 3 (three) months.   EPINEPHrine 0.15 MG/0.3ML injection Commonly known as:  EPIPEN JR INJECT ONE SYRINGEFUL INTRAMUSCULARLY AS NEEDED FOR  ANAPHYLAXIS   ferrous sulfate 325 (65 FE) MG tablet Take 1 tablet (325 mg total) by mouth 2 (two) times daily with a meal.   Fish Oil 1200 MG Caps Take 1 capsule by mouth daily.   hydrochlorothiazide 25 MG tablet Commonly known as:  HYDRODIURIL TAKE ONE TABLET BY MOUTH DAILY.   lovastatin 40 MG tablet Commonly known as:  MEVACOR Take 1 tablet (40 mg total) by mouth at bedtime.   nitroGLYCERIN 0.4 MG/SPRAY spray Commonly known as:  NITROLINGUAL PLACE ONE SPRAY UNDER THE TONGUE EVERY FIVE MINUTES AS NEEDED FOR CHEST PAIN   oxyCODONE 5 MG immediate release tablet Commonly known as:  Oxy IR/ROXICODONE Take 1-2 tablets (5-10 mg total) by mouth every 6 (six) hours as needed for breakthrough pain ((  for MODERATE breakthrough pain)).   polyethylene glycol packet Commonly known as:  MIRALAX / GLYCOLAX Take 17 g by mouth daily as needed for mild constipation.   SYSTANE OP Apply 1 drop to eye as needed (for dry eyes).       Contact information for follow-up providers    Mauri Pole, MD Follow up in 2 week(s).   Specialty:  Orthopedic Surgery Why:  wound check and x-rays Contact information: [address] [place] [employer] [place] [address] [id number]        Mauri Pole, MD Follow up.   Specialty:  Orthopedic Surgery Contact information: [address] [place] [address]            Contact information for after-discharge care    Destination    [facility] Follow up.   Specialty:  [place] information: [address] [place] [phone]                   The results of significant diagnostics from this hospitalization (including imaging, microbiology, ancillary and  laboratory) are listed below for reference.     Microbiology: Recent Results (from the past 240 hour(s))  Surgical pcr screen     Status: None   Collection Time: 01/14/17  3:23 AM  Result Value Ref Range Status   MRSA, PCR NEGATIVE NEGATIVE Final   Staphylococcus aureus NEGATIVE NEGATIVE Final    Comment:        The Xpert SA Assay (FDA approved for NASAL specimens in patients over 52 years of age), is one component of a comprehensive surveillance program.  Test performance has been validated by [facility] for patients greater than or equal to 49 year old. It is not intended to diagnose infection nor to guide or monitor treatment.      Labs: Basic Metabolic Panel:  Recent Labs Lab 01/13/17 2337 01/15/17 0336 01/16/17 0412 01/18/17 0345 01/19/17 0434  NA 138 137 136 137 138  K 3.4* 4.1 3.7 3.4* 3.8  CL 102 103 102 99* 102  CO2 21* 25 26 29 26   GLUCOSE 152* 178* 110* 98 95  BUN 27* 26* 31* 27* 22*  CREATININE 0.97 1.10* 1.20* 0.82 0.84  CALCIUM 9.0 8.0* 8.2* 8.4* 8.8*   CBC:  Recent Labs Lab 01/13/17 2337 01/15/17 0336 01/16/17 0412 01/17/17 0322 01/18/17 0345 01/19/17 0434  WBC 14.0* 8.9 9.8 8.6 7.2 8.7  NEUTROABS 11.9*  --   --   --   --   --   HGB 12.0 7.5* 6.1* 7.3* 7.2* 8.6*  HCT 36.3 22.6* 18.4* 21.8* 21.4* 26.3*  MCV 94.0 93.8 94.4 92.8 93.9 95.6  PLT 211 148* 130* 134* 169 216    BNP (last 3 results)  Recent Labs  01/13/17 2337  BNP 37.3   SIGNED: Time coordinating discharge:  30 minutes  Faye Ramsay, MD  [facility] 01/20/2017, 11:04 AM Pager [phone]  If 7PM-7AM, please contact night-coverage [email or web address] Password [email or web address]

## 2017-01-20 NOTE — Progress Notes (Signed)
Report called to Therapist, sports at [employer]

## 2017-01-24 DIAGNOSIS — S72001A Fracture of unspecified part of neck of right femur, initial encounter for closed fracture: Secondary | ICD-10-CM | POA: Diagnosis not present

## 2017-01-24 DIAGNOSIS — D62 Acute posthemorrhagic anemia: Secondary | ICD-10-CM | POA: Diagnosis not present

## 2017-01-24 DIAGNOSIS — I10 Essential (primary) hypertension: Secondary | ICD-10-CM | POA: Diagnosis not present

## 2017-01-24 DIAGNOSIS — R198 Other specified symptoms and signs involving the digestive system and abdomen: Secondary | ICD-10-CM | POA: Diagnosis not present

## 2017-02-04 DIAGNOSIS — S72141D Displaced intertrochanteric fracture of right femur, subsequent encounter for closed fracture with routine healing: Secondary | ICD-10-CM | POA: Diagnosis not present

## 2017-02-04 DIAGNOSIS — Z4789 Encounter for other orthopedic aftercare: Secondary | ICD-10-CM | POA: Diagnosis not present

## 2017-02-21 DIAGNOSIS — I10 Essential (primary) hypertension: Secondary | ICD-10-CM | POA: Diagnosis not present

## 2017-02-21 DIAGNOSIS — D62 Acute posthemorrhagic anemia: Secondary | ICD-10-CM | POA: Diagnosis not present

## 2017-02-21 DIAGNOSIS — S72001D Fracture of unspecified part of neck of right femur, subsequent encounter for closed fracture with routine healing: Secondary | ICD-10-CM | POA: Diagnosis not present

## 2017-02-21 DIAGNOSIS — I69351 Hemiplegia and hemiparesis following cerebral infarction affecting right dominant side: Secondary | ICD-10-CM | POA: Diagnosis not present

## 2017-02-27 DIAGNOSIS — Z4789 Encounter for other orthopedic aftercare: Secondary | ICD-10-CM | POA: Diagnosis not present

## 2017-02-27 DIAGNOSIS — S72141D Displaced intertrochanteric fracture of right femur, subsequent encounter for closed fracture with routine healing: Secondary | ICD-10-CM | POA: Diagnosis not present

## 2017-03-07 DIAGNOSIS — S72001D Fracture of unspecified part of neck of right femur, subsequent encounter for closed fracture with routine healing: Secondary | ICD-10-CM | POA: Diagnosis not present

## 2017-03-07 DIAGNOSIS — D6489 Other specified anemias: Secondary | ICD-10-CM | POA: Diagnosis not present

## 2017-03-07 DIAGNOSIS — I10 Essential (primary) hypertension: Secondary | ICD-10-CM | POA: Diagnosis not present

## 2017-03-13 DIAGNOSIS — Z9181 History of falling: Secondary | ICD-10-CM | POA: Diagnosis not present

## 2017-03-13 DIAGNOSIS — D510 Vitamin B12 deficiency anemia due to intrinsic factor deficiency: Secondary | ICD-10-CM | POA: Diagnosis not present

## 2017-03-13 DIAGNOSIS — M1990 Unspecified osteoarthritis, unspecified site: Secondary | ICD-10-CM | POA: Diagnosis not present

## 2017-03-13 DIAGNOSIS — F39 Unspecified mood [affective] disorder: Secondary | ICD-10-CM | POA: Diagnosis not present

## 2017-03-13 DIAGNOSIS — M6281 Muscle weakness (generalized): Secondary | ICD-10-CM | POA: Diagnosis not present

## 2017-03-13 DIAGNOSIS — S72001D Fracture of unspecified part of neck of right femur, subsequent encounter for closed fracture with routine healing: Secondary | ICD-10-CM | POA: Diagnosis not present

## 2017-03-13 DIAGNOSIS — I10 Essential (primary) hypertension: Secondary | ICD-10-CM | POA: Diagnosis not present

## 2017-03-13 DIAGNOSIS — E669 Obesity, unspecified: Secondary | ICD-10-CM | POA: Diagnosis not present

## 2017-03-13 DIAGNOSIS — E785 Hyperlipidemia, unspecified: Secondary | ICD-10-CM | POA: Diagnosis not present

## 2017-03-13 DIAGNOSIS — I69354 Hemiplegia and hemiparesis following cerebral infarction affecting left non-dominant side: Secondary | ICD-10-CM | POA: Diagnosis not present

## 2017-03-13 DIAGNOSIS — L989 Disorder of the skin and subcutaneous tissue, unspecified: Secondary | ICD-10-CM | POA: Diagnosis not present

## 2017-03-15 ENCOUNTER — Telehealth: Payer: Self-pay

## 2017-03-15 DIAGNOSIS — I69354 Hemiplegia and hemiparesis following cerebral infarction affecting left non-dominant side: Secondary | ICD-10-CM | POA: Diagnosis not present

## 2017-03-15 DIAGNOSIS — D510 Vitamin B12 deficiency anemia due to intrinsic factor deficiency: Secondary | ICD-10-CM | POA: Diagnosis not present

## 2017-03-15 DIAGNOSIS — L989 Disorder of the skin and subcutaneous tissue, unspecified: Secondary | ICD-10-CM | POA: Diagnosis not present

## 2017-03-15 DIAGNOSIS — S72001D Fracture of unspecified part of neck of right femur, subsequent encounter for closed fracture with routine healing: Secondary | ICD-10-CM | POA: Diagnosis not present

## 2017-03-15 DIAGNOSIS — I10 Essential (primary) hypertension: Secondary | ICD-10-CM | POA: Diagnosis not present

## 2017-03-15 DIAGNOSIS — E785 Hyperlipidemia, unspecified: Secondary | ICD-10-CM | POA: Diagnosis not present

## 2017-03-15 NOTE — Telephone Encounter (Signed)
Pt had HH Evaluation today. Needs verbal orders for PT 3x week for 2 weeks and 2x week for 4 weeks. Also wants an order for OT evaluation. Pt had declined it this week but is willing to do it next week. Call Mickel Baas  [phone]

## 2017-03-16 NOTE — Telephone Encounter (Signed)
Okay to approve.

## 2017-03-18 ENCOUNTER — Telehealth: Payer: Self-pay

## 2017-03-18 DIAGNOSIS — S72001D Fracture of unspecified part of neck of right femur, subsequent encounter for closed fracture with routine healing: Secondary | ICD-10-CM | POA: Diagnosis not present

## 2017-03-18 DIAGNOSIS — L989 Disorder of the skin and subcutaneous tissue, unspecified: Secondary | ICD-10-CM | POA: Diagnosis not present

## 2017-03-18 DIAGNOSIS — D510 Vitamin B12 deficiency anemia due to intrinsic factor deficiency: Secondary | ICD-10-CM | POA: Diagnosis not present

## 2017-03-18 DIAGNOSIS — I10 Essential (primary) hypertension: Secondary | ICD-10-CM | POA: Diagnosis not present

## 2017-03-18 DIAGNOSIS — E785 Hyperlipidemia, unspecified: Secondary | ICD-10-CM | POA: Diagnosis not present

## 2017-03-18 DIAGNOSIS — I69354 Hemiplegia and hemiparesis following cerebral infarction affecting left non-dominant side: Secondary | ICD-10-CM | POA: Diagnosis not present

## 2017-03-18 NOTE — Telephone Encounter (Signed)
Noted  

## 2017-03-18 NOTE — Telephone Encounter (Signed)
Spoke to [place] and provided verbal orders

## 2017-03-18 NOTE — Telephone Encounter (Signed)
Esther OT with [facility] left v/m that pts husband declined OT HH evaluation. Sherlynn Stalls does not need cb this is FYI to Dr Silvio Pate.

## 2017-03-20 ENCOUNTER — Ambulatory Visit: Payer: Medicare Other

## 2017-03-20 DIAGNOSIS — D510 Vitamin B12 deficiency anemia due to intrinsic factor deficiency: Secondary | ICD-10-CM | POA: Diagnosis not present

## 2017-03-20 DIAGNOSIS — E785 Hyperlipidemia, unspecified: Secondary | ICD-10-CM | POA: Diagnosis not present

## 2017-03-20 DIAGNOSIS — L989 Disorder of the skin and subcutaneous tissue, unspecified: Secondary | ICD-10-CM | POA: Diagnosis not present

## 2017-03-20 DIAGNOSIS — I10 Essential (primary) hypertension: Secondary | ICD-10-CM | POA: Diagnosis not present

## 2017-03-20 DIAGNOSIS — S72001D Fracture of unspecified part of neck of right femur, subsequent encounter for closed fracture with routine healing: Secondary | ICD-10-CM | POA: Diagnosis not present

## 2017-03-20 DIAGNOSIS — I69354 Hemiplegia and hemiparesis following cerebral infarction affecting left non-dominant side: Secondary | ICD-10-CM | POA: Diagnosis not present

## 2017-03-21 DIAGNOSIS — E785 Hyperlipidemia, unspecified: Secondary | ICD-10-CM | POA: Diagnosis not present

## 2017-03-21 DIAGNOSIS — L989 Disorder of the skin and subcutaneous tissue, unspecified: Secondary | ICD-10-CM | POA: Diagnosis not present

## 2017-03-21 DIAGNOSIS — S72001D Fracture of unspecified part of neck of right femur, subsequent encounter for closed fracture with routine healing: Secondary | ICD-10-CM | POA: Diagnosis not present

## 2017-03-21 DIAGNOSIS — I10 Essential (primary) hypertension: Secondary | ICD-10-CM | POA: Diagnosis not present

## 2017-03-21 DIAGNOSIS — D510 Vitamin B12 deficiency anemia due to intrinsic factor deficiency: Secondary | ICD-10-CM | POA: Diagnosis not present

## 2017-03-21 DIAGNOSIS — I69354 Hemiplegia and hemiparesis following cerebral infarction affecting left non-dominant side: Secondary | ICD-10-CM | POA: Diagnosis not present

## 2017-03-22 DIAGNOSIS — I10 Essential (primary) hypertension: Secondary | ICD-10-CM | POA: Diagnosis not present

## 2017-03-22 DIAGNOSIS — E785 Hyperlipidemia, unspecified: Secondary | ICD-10-CM | POA: Diagnosis not present

## 2017-03-22 DIAGNOSIS — D510 Vitamin B12 deficiency anemia due to intrinsic factor deficiency: Secondary | ICD-10-CM | POA: Diagnosis not present

## 2017-03-22 DIAGNOSIS — S72001D Fracture of unspecified part of neck of right femur, subsequent encounter for closed fracture with routine healing: Secondary | ICD-10-CM | POA: Diagnosis not present

## 2017-03-22 DIAGNOSIS — L989 Disorder of the skin and subcutaneous tissue, unspecified: Secondary | ICD-10-CM | POA: Diagnosis not present

## 2017-03-22 DIAGNOSIS — I69354 Hemiplegia and hemiparesis following cerebral infarction affecting left non-dominant side: Secondary | ICD-10-CM | POA: Diagnosis not present

## 2017-03-24 ENCOUNTER — Other Ambulatory Visit: Payer: Self-pay | Admitting: Internal Medicine

## 2017-03-25 ENCOUNTER — Telehealth: Payer: Self-pay

## 2017-03-25 DIAGNOSIS — D510 Vitamin B12 deficiency anemia due to intrinsic factor deficiency: Secondary | ICD-10-CM | POA: Diagnosis not present

## 2017-03-25 DIAGNOSIS — I69354 Hemiplegia and hemiparesis following cerebral infarction affecting left non-dominant side: Secondary | ICD-10-CM | POA: Diagnosis not present

## 2017-03-25 DIAGNOSIS — S72001D Fracture of unspecified part of neck of right femur, subsequent encounter for closed fracture with routine healing: Secondary | ICD-10-CM | POA: Diagnosis not present

## 2017-03-25 DIAGNOSIS — L989 Disorder of the skin and subcutaneous tissue, unspecified: Secondary | ICD-10-CM | POA: Diagnosis not present

## 2017-03-25 DIAGNOSIS — I10 Essential (primary) hypertension: Secondary | ICD-10-CM | POA: Diagnosis not present

## 2017-03-25 DIAGNOSIS — E785 Hyperlipidemia, unspecified: Secondary | ICD-10-CM | POA: Diagnosis not present

## 2017-03-25 MED ORDER — CEPHALEXIN 250 MG PO CAPS: 250.0000 mg | ORAL_CAPSULE | Freq: Three times a day (TID) | ORAL | 0 refills | Status: AC

## 2017-03-25 NOTE — Telephone Encounter (Signed)
PLEASE NOTE: All timestamps contained within this report are represented as [place] Standard Time. CONFIDENTIALTY NOTICE: This fax transmission is intended only for the addressee. It contains information that is legally privileged, confidential or otherwise protected from use or disclosure. If you are not the intended recipient, you are strictly prohibited from reviewing, disclosing, copying using or disseminating any of this information or taking any action in reliance on or regarding this information. If you have received this fax in error, please notify us immediately by telephone so that we can arrange for its return to [place]. Phone: [phone], Toll-Free: [phone], Fax: [phone] Page: 1 of 2 Call Id: [id number] [place] Patient Name: Mallory Rodriguez Gender: Female DOB: 1933-07-27 Age: 82 Y 3 M 20 D Return Phone Number: [phone] (Primary) City/State/Zip: [place] [address] Client [facility] - Client Client Site [facility] - Night Physician [name] - MD Who Is Calling Patient / Member / Family / Caregiver Call Type Triage / Clinical Caller Name Mr. Olshefski Relationship To Patient Spouse Return Phone Number [phone] (Primary) Chief Complaint Pain - Generalized Reason for Call Symptomatic / Request for [place] states wife broke her hip and has a uti in which she is suffering from, requesting a prescription refill. Nurse Assessment Nurse: Seward Speck, RN, Izora Gala Date/Time (Eastern Time): 03/24/2017 2:41:46 PM Confirm and document reason for call. If symptomatic, describe symptoms. ---Caller states he wife thinks she has a UTI . Has pain with urination. Does the PT have any chronic conditions? (i.e. diabetes, asthma, etc.) ---No Guidelines Guideline Title Affirmed Question Urination  Pain - Female [1] SEVERE pain with urination (e.g., excruciating) AND [2] not improved after 2 hours of pain medicine and Sitz bath Disp. Time Eilene Ghazi Time) Disposition Final User 03/24/2017 2:48:06 PM See Physician within 4 Hours (or PCP triage) Yes Mills-Hernandez, RN, Izora Gala Referrals REFERRED TO PCP OFFICE Care Advice Given Per Guideline SEE PHYSICIAN WITHIN 4 HOURS (or PCP triage): * IF OFFICE WILL BE OPEN: You need to be seen within the next 3 or 4 hours. Call your doctor's office now or as soon as it opens. CALL BACK IF: * You become worse. CARE ADVICE given per Urination Pain - Female (Adult) guideline. Comments User: Haroldine Laws, RN Date/Time Eilene Ghazi Time): 03/24/2017 2:47:57 PM Caller states that he can't lift his wife to get her to any physician's office. He will just wait and phone her doctor in the morning. He states his wife broke her hip in January and he is not able to get her out of the house. PLEASE NOTE: All timestamps contained within this report are represented as [place] Standard Time. CONFIDENTIALTY NOTICE: This fax transmission is intended only for the addressee. It contains information that is legally privileged, confidential or otherwise protected from use or disclosure. If you are not the intended recipient, you are strictly prohibited from reviewing, disclosing, copying using or disseminating any of this information or taking any action in reliance on or regarding this information. If you have received this fax in error, please notify us immediately by telephone so that we can arrange for its return to [place]. Phone: [phone], Toll-Free: [phone], Fax: [phone] Page: 2 of 2 Call Id: [id number]

## 2017-03-25 NOTE — Telephone Encounter (Signed)
Mallory Rodriguez left v/m; pt is at home after fx hip and femur; pt needs med for UTI (pain upon urination)and request med sent to [facility].pt cannot be moved outside of home at this time. Mallory Rodriguez request cb.

## 2017-03-25 NOTE — Telephone Encounter (Signed)
Okay to send Rx for cephalexin 250mg  tid  #9 x 0 If she doesn't respond, she will need to be seen somewhere

## 2017-03-25 NOTE — Telephone Encounter (Signed)
Medication sent to the pharmacy. Spoke to husband. He said she has [place] coming in. I advised him if no better after the abx, let us know so we could look into getting HH to do a culture or something since he cannot move her

## 2017-03-27 ENCOUNTER — Ambulatory Visit: Payer: Medicare Other | Admitting: Cardiovascular Disease

## 2017-03-27 DIAGNOSIS — S72001D Fracture of unspecified part of neck of right femur, subsequent encounter for closed fracture with routine healing: Secondary | ICD-10-CM | POA: Diagnosis not present

## 2017-03-27 DIAGNOSIS — I69354 Hemiplegia and hemiparesis following cerebral infarction affecting left non-dominant side: Secondary | ICD-10-CM | POA: Diagnosis not present

## 2017-03-27 DIAGNOSIS — D510 Vitamin B12 deficiency anemia due to intrinsic factor deficiency: Secondary | ICD-10-CM | POA: Diagnosis not present

## 2017-03-27 DIAGNOSIS — E785 Hyperlipidemia, unspecified: Secondary | ICD-10-CM | POA: Diagnosis not present

## 2017-03-27 DIAGNOSIS — L989 Disorder of the skin and subcutaneous tissue, unspecified: Secondary | ICD-10-CM | POA: Diagnosis not present

## 2017-03-27 DIAGNOSIS — I10 Essential (primary) hypertension: Secondary | ICD-10-CM | POA: Diagnosis not present

## 2017-03-28 ENCOUNTER — Ambulatory Visit: Payer: Medicare Other | Admitting: Cardiovascular Disease

## 2017-03-28 DIAGNOSIS — I69354 Hemiplegia and hemiparesis following cerebral infarction affecting left non-dominant side: Secondary | ICD-10-CM | POA: Diagnosis not present

## 2017-03-28 DIAGNOSIS — S72001D Fracture of unspecified part of neck of right femur, subsequent encounter for closed fracture with routine healing: Secondary | ICD-10-CM | POA: Diagnosis not present

## 2017-03-28 DIAGNOSIS — E785 Hyperlipidemia, unspecified: Secondary | ICD-10-CM | POA: Diagnosis not present

## 2017-03-28 DIAGNOSIS — L989 Disorder of the skin and subcutaneous tissue, unspecified: Secondary | ICD-10-CM | POA: Diagnosis not present

## 2017-03-28 DIAGNOSIS — D510 Vitamin B12 deficiency anemia due to intrinsic factor deficiency: Secondary | ICD-10-CM | POA: Diagnosis not present

## 2017-03-28 DIAGNOSIS — I10 Essential (primary) hypertension: Secondary | ICD-10-CM | POA: Diagnosis not present

## 2017-03-29 DIAGNOSIS — I10 Essential (primary) hypertension: Secondary | ICD-10-CM | POA: Diagnosis not present

## 2017-03-29 DIAGNOSIS — S72001D Fracture of unspecified part of neck of right femur, subsequent encounter for closed fracture with routine healing: Secondary | ICD-10-CM | POA: Diagnosis not present

## 2017-03-29 DIAGNOSIS — E785 Hyperlipidemia, unspecified: Secondary | ICD-10-CM | POA: Diagnosis not present

## 2017-03-29 DIAGNOSIS — D510 Vitamin B12 deficiency anemia due to intrinsic factor deficiency: Secondary | ICD-10-CM | POA: Diagnosis not present

## 2017-03-29 DIAGNOSIS — L989 Disorder of the skin and subcutaneous tissue, unspecified: Secondary | ICD-10-CM | POA: Diagnosis not present

## 2017-03-29 DIAGNOSIS — I69354 Hemiplegia and hemiparesis following cerebral infarction affecting left non-dominant side: Secondary | ICD-10-CM | POA: Diagnosis not present

## 2017-04-01 DIAGNOSIS — I69354 Hemiplegia and hemiparesis following cerebral infarction affecting left non-dominant side: Secondary | ICD-10-CM | POA: Diagnosis not present

## 2017-04-01 DIAGNOSIS — S72001D Fracture of unspecified part of neck of right femur, subsequent encounter for closed fracture with routine healing: Secondary | ICD-10-CM | POA: Diagnosis not present

## 2017-04-01 DIAGNOSIS — D510 Vitamin B12 deficiency anemia due to intrinsic factor deficiency: Secondary | ICD-10-CM | POA: Diagnosis not present

## 2017-04-01 DIAGNOSIS — I10 Essential (primary) hypertension: Secondary | ICD-10-CM | POA: Diagnosis not present

## 2017-04-01 DIAGNOSIS — E785 Hyperlipidemia, unspecified: Secondary | ICD-10-CM | POA: Diagnosis not present

## 2017-04-01 DIAGNOSIS — L989 Disorder of the skin and subcutaneous tissue, unspecified: Secondary | ICD-10-CM | POA: Diagnosis not present

## 2017-04-02 DIAGNOSIS — L989 Disorder of the skin and subcutaneous tissue, unspecified: Secondary | ICD-10-CM | POA: Diagnosis not present

## 2017-04-02 DIAGNOSIS — I69354 Hemiplegia and hemiparesis following cerebral infarction affecting left non-dominant side: Secondary | ICD-10-CM | POA: Diagnosis not present

## 2017-04-02 DIAGNOSIS — S72001D Fracture of unspecified part of neck of right femur, subsequent encounter for closed fracture with routine healing: Secondary | ICD-10-CM | POA: Diagnosis not present

## 2017-04-02 DIAGNOSIS — I10 Essential (primary) hypertension: Secondary | ICD-10-CM | POA: Diagnosis not present

## 2017-04-02 DIAGNOSIS — D510 Vitamin B12 deficiency anemia due to intrinsic factor deficiency: Secondary | ICD-10-CM | POA: Diagnosis not present

## 2017-04-02 DIAGNOSIS — E785 Hyperlipidemia, unspecified: Secondary | ICD-10-CM | POA: Diagnosis not present

## 2017-04-03 DIAGNOSIS — I10 Essential (primary) hypertension: Secondary | ICD-10-CM | POA: Diagnosis not present

## 2017-04-03 DIAGNOSIS — E785 Hyperlipidemia, unspecified: Secondary | ICD-10-CM | POA: Diagnosis not present

## 2017-04-03 DIAGNOSIS — D510 Vitamin B12 deficiency anemia due to intrinsic factor deficiency: Secondary | ICD-10-CM | POA: Diagnosis not present

## 2017-04-03 DIAGNOSIS — L989 Disorder of the skin and subcutaneous tissue, unspecified: Secondary | ICD-10-CM | POA: Diagnosis not present

## 2017-04-03 DIAGNOSIS — S72001D Fracture of unspecified part of neck of right femur, subsequent encounter for closed fracture with routine healing: Secondary | ICD-10-CM | POA: Diagnosis not present

## 2017-04-03 DIAGNOSIS — I69354 Hemiplegia and hemiparesis following cerebral infarction affecting left non-dominant side: Secondary | ICD-10-CM | POA: Diagnosis not present

## 2017-04-05 DIAGNOSIS — L989 Disorder of the skin and subcutaneous tissue, unspecified: Secondary | ICD-10-CM | POA: Diagnosis not present

## 2017-04-05 DIAGNOSIS — I69354 Hemiplegia and hemiparesis following cerebral infarction affecting left non-dominant side: Secondary | ICD-10-CM | POA: Diagnosis not present

## 2017-04-05 DIAGNOSIS — I10 Essential (primary) hypertension: Secondary | ICD-10-CM | POA: Diagnosis not present

## 2017-04-05 DIAGNOSIS — D510 Vitamin B12 deficiency anemia due to intrinsic factor deficiency: Secondary | ICD-10-CM | POA: Diagnosis not present

## 2017-04-05 DIAGNOSIS — E785 Hyperlipidemia, unspecified: Secondary | ICD-10-CM | POA: Diagnosis not present

## 2017-04-05 DIAGNOSIS — S72001D Fracture of unspecified part of neck of right femur, subsequent encounter for closed fracture with routine healing: Secondary | ICD-10-CM | POA: Diagnosis not present

## 2017-04-08 DIAGNOSIS — D510 Vitamin B12 deficiency anemia due to intrinsic factor deficiency: Secondary | ICD-10-CM | POA: Diagnosis not present

## 2017-04-08 DIAGNOSIS — S72001D Fracture of unspecified part of neck of right femur, subsequent encounter for closed fracture with routine healing: Secondary | ICD-10-CM | POA: Diagnosis not present

## 2017-04-08 DIAGNOSIS — I69354 Hemiplegia and hemiparesis following cerebral infarction affecting left non-dominant side: Secondary | ICD-10-CM | POA: Diagnosis not present

## 2017-04-08 DIAGNOSIS — E785 Hyperlipidemia, unspecified: Secondary | ICD-10-CM | POA: Diagnosis not present

## 2017-04-08 DIAGNOSIS — I10 Essential (primary) hypertension: Secondary | ICD-10-CM | POA: Diagnosis not present

## 2017-04-08 DIAGNOSIS — L989 Disorder of the skin and subcutaneous tissue, unspecified: Secondary | ICD-10-CM | POA: Diagnosis not present

## 2017-04-10 DIAGNOSIS — Z4789 Encounter for other orthopedic aftercare: Secondary | ICD-10-CM | POA: Diagnosis not present

## 2017-04-10 DIAGNOSIS — S72141D Displaced intertrochanteric fracture of right femur, subsequent encounter for closed fracture with routine healing: Secondary | ICD-10-CM | POA: Diagnosis not present

## 2017-04-12 ENCOUNTER — Telehealth: Payer: Self-pay

## 2017-04-12 NOTE — Telephone Encounter (Signed)
Verbal orders given to Esther. 

## 2017-04-12 NOTE — Telephone Encounter (Signed)
Esther OT with [facility] left v/m requesting verbal orders for [facility] OT 2 x a week for 2 weeks to complete plan of care.

## 2017-04-12 NOTE — Telephone Encounter (Signed)
That is okay.

## 2017-04-15 ENCOUNTER — Telehealth: Payer: Self-pay

## 2017-04-15 DIAGNOSIS — D510 Vitamin B12 deficiency anemia due to intrinsic factor deficiency: Secondary | ICD-10-CM | POA: Diagnosis not present

## 2017-04-15 DIAGNOSIS — I69354 Hemiplegia and hemiparesis following cerebral infarction affecting left non-dominant side: Secondary | ICD-10-CM | POA: Diagnosis not present

## 2017-04-15 DIAGNOSIS — L989 Disorder of the skin and subcutaneous tissue, unspecified: Secondary | ICD-10-CM | POA: Diagnosis not present

## 2017-04-15 DIAGNOSIS — I10 Essential (primary) hypertension: Secondary | ICD-10-CM | POA: Diagnosis not present

## 2017-04-15 DIAGNOSIS — S72001D Fracture of unspecified part of neck of right femur, subsequent encounter for closed fracture with routine healing: Secondary | ICD-10-CM | POA: Diagnosis not present

## 2017-04-15 DIAGNOSIS — E785 Hyperlipidemia, unspecified: Secondary | ICD-10-CM | POA: Diagnosis not present

## 2017-04-15 NOTE — Telephone Encounter (Signed)
Lm on vm and informed him order is available for pickup from the front desk

## 2017-04-15 NOTE — Telephone Encounter (Signed)
Mr Mandarino left v/m; OT recommends chair lift due to steps being steep. Mr Cudney request letter for pt to get a chair lift and that would save pt the cost of sales tax. Mr Garduno has also requested letter from Dr Alvan Dame, the surgeon. Mr Bedonie request cb when letter is ready.

## 2017-04-15 NOTE — Telephone Encounter (Signed)
I wrote an order for the chair lift---- not sure if that is enough with no face to face visit (but it may work for just sales tax relief)

## 2017-04-17 DIAGNOSIS — I69354 Hemiplegia and hemiparesis following cerebral infarction affecting left non-dominant side: Secondary | ICD-10-CM | POA: Diagnosis not present

## 2017-04-17 DIAGNOSIS — I10 Essential (primary) hypertension: Secondary | ICD-10-CM | POA: Diagnosis not present

## 2017-04-17 DIAGNOSIS — E785 Hyperlipidemia, unspecified: Secondary | ICD-10-CM | POA: Diagnosis not present

## 2017-04-17 DIAGNOSIS — L989 Disorder of the skin and subcutaneous tissue, unspecified: Secondary | ICD-10-CM | POA: Diagnosis not present

## 2017-04-17 DIAGNOSIS — D510 Vitamin B12 deficiency anemia due to intrinsic factor deficiency: Secondary | ICD-10-CM | POA: Diagnosis not present

## 2017-04-17 DIAGNOSIS — S72001D Fracture of unspecified part of neck of right femur, subsequent encounter for closed fracture with routine healing: Secondary | ICD-10-CM | POA: Diagnosis not present

## 2017-04-19 DIAGNOSIS — I10 Essential (primary) hypertension: Secondary | ICD-10-CM | POA: Diagnosis not present

## 2017-04-19 DIAGNOSIS — S72001D Fracture of unspecified part of neck of right femur, subsequent encounter for closed fracture with routine healing: Secondary | ICD-10-CM | POA: Diagnosis not present

## 2017-04-19 DIAGNOSIS — L989 Disorder of the skin and subcutaneous tissue, unspecified: Secondary | ICD-10-CM | POA: Diagnosis not present

## 2017-04-19 DIAGNOSIS — I69354 Hemiplegia and hemiparesis following cerebral infarction affecting left non-dominant side: Secondary | ICD-10-CM | POA: Diagnosis not present

## 2017-04-19 DIAGNOSIS — D510 Vitamin B12 deficiency anemia due to intrinsic factor deficiency: Secondary | ICD-10-CM | POA: Diagnosis not present

## 2017-04-19 DIAGNOSIS — E785 Hyperlipidemia, unspecified: Secondary | ICD-10-CM | POA: Diagnosis not present

## 2017-04-22 ENCOUNTER — Telehealth: Payer: Self-pay

## 2017-04-22 DIAGNOSIS — I10 Essential (primary) hypertension: Secondary | ICD-10-CM | POA: Diagnosis not present

## 2017-04-22 DIAGNOSIS — S72001D Fracture of unspecified part of neck of right femur, subsequent encounter for closed fracture with routine healing: Secondary | ICD-10-CM | POA: Diagnosis not present

## 2017-04-22 DIAGNOSIS — I69354 Hemiplegia and hemiparesis following cerebral infarction affecting left non-dominant side: Secondary | ICD-10-CM | POA: Diagnosis not present

## 2017-04-22 DIAGNOSIS — D510 Vitamin B12 deficiency anemia due to intrinsic factor deficiency: Secondary | ICD-10-CM | POA: Diagnosis not present

## 2017-04-22 DIAGNOSIS — L989 Disorder of the skin and subcutaneous tissue, unspecified: Secondary | ICD-10-CM | POA: Diagnosis not present

## 2017-04-22 DIAGNOSIS — E785 Hyperlipidemia, unspecified: Secondary | ICD-10-CM | POA: Diagnosis not present

## 2017-04-22 NOTE — Telephone Encounter (Signed)
Spoke to [place] and provided verbal orders. Spoke to pt who states she is not wanting to come into office for OV

## 2017-04-22 NOTE — Telephone Encounter (Signed)
Okay See if you can get her a follow up appt here--haven't seen her since the fracture

## 2017-04-22 NOTE — Telephone Encounter (Signed)
Mickel Baas PT with [facility] left v/m requesting to continue [facility] PT 2 x a week for 2 weeks.

## 2017-04-22 NOTE — Telephone Encounter (Signed)
Let patient know that I can't keep approving therapy without even seeing her

## 2017-04-23 NOTE — Telephone Encounter (Signed)
Spoke to pt and advised. States she is unable to walk and get down the steps, to get to the car, until the weekends

## 2017-04-23 NOTE — Telephone Encounter (Signed)
Okay Have her get in once that is feasible

## 2017-04-23 NOTE — Telephone Encounter (Signed)
Spoke to pt. She has an appt here on Thursday

## 2017-04-24 DIAGNOSIS — S72001D Fracture of unspecified part of neck of right femur, subsequent encounter for closed fracture with routine healing: Secondary | ICD-10-CM | POA: Diagnosis not present

## 2017-04-24 DIAGNOSIS — L989 Disorder of the skin and subcutaneous tissue, unspecified: Secondary | ICD-10-CM | POA: Diagnosis not present

## 2017-04-24 DIAGNOSIS — E785 Hyperlipidemia, unspecified: Secondary | ICD-10-CM | POA: Diagnosis not present

## 2017-04-24 DIAGNOSIS — I10 Essential (primary) hypertension: Secondary | ICD-10-CM | POA: Diagnosis not present

## 2017-04-24 DIAGNOSIS — I69354 Hemiplegia and hemiparesis following cerebral infarction affecting left non-dominant side: Secondary | ICD-10-CM | POA: Diagnosis not present

## 2017-04-24 DIAGNOSIS — D510 Vitamin B12 deficiency anemia due to intrinsic factor deficiency: Secondary | ICD-10-CM | POA: Diagnosis not present

## 2017-04-25 ENCOUNTER — Encounter: Payer: Self-pay | Admitting: Internal Medicine

## 2017-04-25 ENCOUNTER — Ambulatory Visit (INDEPENDENT_AMBULATORY_CARE_PROVIDER_SITE_OTHER): Payer: Medicare Other | Admitting: Internal Medicine

## 2017-04-25 VITALS — BP 122/78 | HR 77 | Temp 97.8°F | Wt 148.0 lb

## 2017-04-25 DIAGNOSIS — F39 Unspecified mood [affective] disorder: Secondary | ICD-10-CM | POA: Diagnosis not present

## 2017-04-25 DIAGNOSIS — L03019 Cellulitis of unspecified finger: Secondary | ICD-10-CM | POA: Insufficient documentation

## 2017-04-25 DIAGNOSIS — L03012 Cellulitis of left finger: Secondary | ICD-10-CM | POA: Diagnosis not present

## 2017-04-25 DIAGNOSIS — M79661 Pain in right lower leg: Secondary | ICD-10-CM

## 2017-04-25 DIAGNOSIS — S72001S Fracture of unspecified part of neck of right femur, sequela: Secondary | ICD-10-CM | POA: Diagnosis not present

## 2017-04-25 MED ORDER — CEPHALEXIN 500 MG PO CAPS: 500.0000 mg | ORAL_CAPSULE | Freq: Three times a day (TID) | ORAL | 0 refills | Status: DC

## 2017-04-25 NOTE — Assessment & Plan Note (Signed)
Will treat with cephalexin

## 2017-04-25 NOTE — Assessment & Plan Note (Signed)
Very angry now Family is concerned She refuses to try medication at this point

## 2017-04-25 NOTE — Progress Notes (Signed)
Subjective:    Patient ID: Mallory Rodriguez, female    DOB: [birth year], 83 y.o.   MRN: [id number]  HPI Here with husband and daughter  She fell in her house and fractured hip Just over 3 months since the right hip fracture Rehab at [facility] for a little more than a month OT is done but she can't shower (can't get upstairs to shower) Has to do basin bath at sink PT still coming--- they are working on steps (but in and out house only thus far)  The pain is still bad in leg when walking Okay when sitting or in bed--after a while Tylenol not helping The worst spot is her right ankle--it has stayed swollen and painful since the fall  Current Outpatient Prescriptions on File Prior to Visit  Medication Sig Dispense Refill  . acetaminophen (TYLENOL) 650 MG CR tablet Take 650 mg by mouth 3 (three) times daily.    Marland Kitchen aspirin EC 325 MG EC tablet Take 1 tablet (325 mg total) by mouth daily with breakfast. 30 tablet 0  . CALCIUM-VITAMIN D-VITAMIN K PO Take 1 each by mouth daily.    . clopidogrel (PLAVIX) 75 MG tablet TAKE 1 TABLET BY MOUTH EVERY DAY 30 tablet 2  . cyanocobalamin (,VITAMIN B-12,) 1000 MCG/ML injection Inject 1,000 mcg into the muscle every 3 (three) months.    . EPINEPHrine (EPIPEN JR) 0.15 MG/0.3ML injection INJECT ONE SYRINGEFUL INTRAMUSCULARLY AS NEEDED FOR  ANAPHYLAXIS 2 each 0  . ferrous sulfate 325 (65 FE) MG tablet Take 1 tablet (325 mg total) by mouth 2 (two) times daily with a meal. 60 tablet 3  . hydrochlorothiazide (HYDRODIURIL) 25 MG tablet TAKE ONE TABLET BY MOUTH DAILY. 30 tablet 5  . lovastatin (MEVACOR) 40 MG tablet Take 1 tablet (40 mg total) by mouth at bedtime. 90 tablet 3  . nitroGLYCERIN (NITROLINGUAL) 0.4 MG/SPRAY spray PLACE ONE SPRAY UNDER THE TONGUE EVERY FIVE MINUTES AS NEEDED FOR CHEST PAIN 5 g 0  . Omega-3 Fatty Acids (FISH OIL) 1200 MG CAPS Take 1 capsule by mouth daily.    Vladimir Faster Glycol-Propyl Glycol (SYSTANE OP) Apply 1 drop to eye as needed  (for dry eyes).    . polyethylene glycol (MIRALAX / GLYCOLAX) packet Take 17 g by mouth daily as needed for mild constipation. 14 each 0  . Probiotic Product (ALIGN) 4 MG CAPS Take 1 capsule by mouth daily.     No current facility-administered medications on file prior to visit.     Allergies  Allergen Reactions  . Bee Venom Anaphylaxis  . Contrast Media [Iodinated Diagnostic Agents] Hives  . Lipitor [Atorvastatin] Swelling  . Ace Inhibitors Swelling  . Hydrocodone Other (See Comments)    Reaction:  Unknown   . Macrodantin Other (See Comments)    Reaction:  Chest pain   . Neosporin [Neomycin-Bacitracin Zn-Polymyx] Other (See Comments)    Reaction:  Burning   . Sulfa Antibiotics Hives  . Epinephrine Palpitations  . Prednisone Rash and Other (See Comments)    Reaction:  Dizziness and headache     Past Medical History:  Diagnosis Date  . AP (angina pectoris) ([place])   . Claustrophobia   . Diverticulosis of colon   . Hiatal hernia   . HLD (hyperlipidemia)   . HTN (hypertension)   . Hypogammaglobulinemia ([place])    borderline  . MVP (mitral valve prolapse)    and tricuspid leak  . OA (osteoarthritis)   . Pelvic kidney  lower left side  . Pernicious anemia   . Sciatica   . TIA (transient ischemic attack) 8/16    Past Surgical History:  Procedure Laterality Date  . APPENDECTOMY    . BASAL CELL CARCINOMA EXCISION     Back  . BREAST BIOPSY Right    neg  . BREAST LUMPECTOMY     Right (fatty tumor)  . CATARACT EXTRACTION W/ INTRAOCULAR LENS  IMPLANT, BILATERAL Bilateral   . CHOLECYSTECTOMY OPEN    . DILATION AND CURETTAGE OF UTERUS    . excision vestibular glands    . Film removed Left eye    . INGUINAL HERNIA REPAIR     Left side  . INTRAMEDULLARY (IM) NAIL INTERTROCHANTERIC Right 01/14/2017   Procedure: ORIF RIGHT INTERTROCH NAIL;  Surgeon: Paralee Cancel, MD;  Location: [place];  Service: Orthopedics;  Laterality: Right;  . KNEE ARTHROSCOPY W/ PARTIAL MEDIAL  MENISCECTOMY  08/2009   right  . Right ovary and tube corpus    . TONSILLECTOMY    . TOTAL ABDOMINAL HYSTERECTOMY      Family History  Problem Relation Age of Onset  . Heart attack Father 90    CABG  . Hypertension Father   . Dementia Mother   . Cancer Maternal Aunt     Breast-multiple aunts and Gm  . Cancer Maternal Uncle     Bladder and lung  . Diabetes Paternal Grandmother   . Hypertension Brother     Social History   Social History  . Marital status: Married    Spouse name: N/A  . Number of children: 2  . Years of education: N/A   Occupational History  . Retired-bookkeeping/secretarial work     retired  .      Social History Main Topics  . Smoking status: Never Smoker  . Smokeless tobacco: Never Used  . Alcohol use 1.8 oz/week    3 Glasses of wine per week  . Drug use: No  . Sexual activity: Not on file   Other Topics Concern  . Not on file   Social History Narrative   Has living will and advance directives.    Requests husband, then daughter to make health care decisions.   Would accept resuscitation but no life prolonging technology if cognitively unaware   Review of Systems Able to sleep Appetite is off Has lost about 10# since the fracture Pain in left 2nd finger ---goes way back    Objective:   Physical Exam  Constitutional: She appears well-nourished. No distress.  Cardiovascular: Normal rate, regular rhythm and normal heart sounds.  Exam reveals no gallop.   No murmur heard. Pulmonary/Chest: Effort normal and breath sounds normal. No respiratory distress. She has no wheezes. She has no rales.  Musculoskeletal:  Right calf swelling and tenderness 2+ edema in foot  Left 2nd finger is red and tender (distal phalanx)  Psychiatric:  Angry, raising her voice, etc          Assessment & Plan:

## 2017-04-25 NOTE — Progress Notes (Signed)
Pre visit review using our clinic review tool, if applicable. No additional management support is needed unless otherwise documented below in the visit note. 

## 2017-04-25 NOTE — Assessment & Plan Note (Signed)
Still limited to ground floor Working with therapy but still limited Pain an issue-- but more in calf

## 2017-04-25 NOTE — Assessment & Plan Note (Signed)
Has been going on for a long time Since the surgery My concern is a DVT---- only in calf Will check doppler--if positive, start eliquis 5 bid

## 2017-04-26 ENCOUNTER — Ambulatory Visit
Admission: RE | Admit: 2017-04-26 | Discharge: 2017-04-26 | Disposition: A | Discharge status: Home / Self Care | Payer: Medicare Other | Source: Ambulatory Visit | Attending: Internal Medicine | Admitting: Internal Medicine

## 2017-04-26 ENCOUNTER — Telehealth: Payer: Self-pay

## 2017-04-26 DIAGNOSIS — M79604 Pain in right leg: Secondary | ICD-10-CM | POA: Diagnosis not present

## 2017-04-26 DIAGNOSIS — M79661 Pain in right lower leg: Secondary | ICD-10-CM | POA: Diagnosis not present

## 2017-04-26 DIAGNOSIS — M25571 Pain in right ankle and joints of right foot: Principal | ICD-10-CM

## 2017-04-26 DIAGNOSIS — G8929 Other chronic pain: Secondary | ICD-10-CM

## 2017-04-26 DIAGNOSIS — M7989 Other specified soft tissue disorders: Secondary | ICD-10-CM | POA: Diagnosis not present

## 2017-04-26 NOTE — Telephone Encounter (Signed)
Scan was negative for DVT. Pt was aware and had already left.  Pt's daughter had called asking for an xray of her ankle be done. We looked at Dr Alla German note from yesterday and the xray was not mentioned. Daughter was made aware that it may be Tuesday before we get authorization to do one if Dr Silvio Pate said to do it. She was fine with that.

## 2017-04-27 NOTE — Telephone Encounter (Signed)
Please have her come in for the x-ray

## 2017-04-29 DIAGNOSIS — L989 Disorder of the skin and subcutaneous tissue, unspecified: Secondary | ICD-10-CM | POA: Diagnosis not present

## 2017-04-29 DIAGNOSIS — I69354 Hemiplegia and hemiparesis following cerebral infarction affecting left non-dominant side: Secondary | ICD-10-CM | POA: Diagnosis not present

## 2017-04-29 DIAGNOSIS — D510 Vitamin B12 deficiency anemia due to intrinsic factor deficiency: Secondary | ICD-10-CM | POA: Diagnosis not present

## 2017-04-29 DIAGNOSIS — E785 Hyperlipidemia, unspecified: Secondary | ICD-10-CM | POA: Diagnosis not present

## 2017-04-29 DIAGNOSIS — I10 Essential (primary) hypertension: Secondary | ICD-10-CM | POA: Diagnosis not present

## 2017-04-29 DIAGNOSIS — S72001D Fracture of unspecified part of neck of right femur, subsequent encounter for closed fracture with routine healing: Secondary | ICD-10-CM | POA: Diagnosis not present

## 2017-04-29 NOTE — Telephone Encounter (Signed)
Spoke to pt's husband. He will let the pt know and decide if they want to come have it done.

## 2017-05-01 DIAGNOSIS — L989 Disorder of the skin and subcutaneous tissue, unspecified: Secondary | ICD-10-CM | POA: Diagnosis not present

## 2017-05-01 DIAGNOSIS — I69354 Hemiplegia and hemiparesis following cerebral infarction affecting left non-dominant side: Secondary | ICD-10-CM | POA: Diagnosis not present

## 2017-05-01 DIAGNOSIS — E785 Hyperlipidemia, unspecified: Secondary | ICD-10-CM | POA: Diagnosis not present

## 2017-05-01 DIAGNOSIS — S72001D Fracture of unspecified part of neck of right femur, subsequent encounter for closed fracture with routine healing: Secondary | ICD-10-CM | POA: Diagnosis not present

## 2017-05-01 DIAGNOSIS — D510 Vitamin B12 deficiency anemia due to intrinsic factor deficiency: Secondary | ICD-10-CM | POA: Diagnosis not present

## 2017-05-01 DIAGNOSIS — I10 Essential (primary) hypertension: Secondary | ICD-10-CM | POA: Diagnosis not present

## 2017-05-06 DIAGNOSIS — I10 Essential (primary) hypertension: Secondary | ICD-10-CM | POA: Diagnosis not present

## 2017-05-06 DIAGNOSIS — S72001D Fracture of unspecified part of neck of right femur, subsequent encounter for closed fracture with routine healing: Secondary | ICD-10-CM | POA: Diagnosis not present

## 2017-05-06 DIAGNOSIS — E785 Hyperlipidemia, unspecified: Secondary | ICD-10-CM | POA: Diagnosis not present

## 2017-05-06 DIAGNOSIS — L989 Disorder of the skin and subcutaneous tissue, unspecified: Secondary | ICD-10-CM | POA: Diagnosis not present

## 2017-05-06 DIAGNOSIS — D510 Vitamin B12 deficiency anemia due to intrinsic factor deficiency: Secondary | ICD-10-CM | POA: Diagnosis not present

## 2017-05-06 DIAGNOSIS — I69354 Hemiplegia and hemiparesis following cerebral infarction affecting left non-dominant side: Secondary | ICD-10-CM | POA: Diagnosis not present

## 2017-05-08 ENCOUNTER — Ambulatory Visit (INDEPENDENT_AMBULATORY_CARE_PROVIDER_SITE_OTHER)
Admission: RE | Admit: 2017-05-08 | Discharge: 2017-05-08 | Disposition: A | Discharge status: Home / Self Care | Payer: Medicare Other | Source: Ambulatory Visit | Attending: Internal Medicine | Admitting: Internal Medicine

## 2017-05-08 ENCOUNTER — Telehealth: Payer: Self-pay

## 2017-05-08 DIAGNOSIS — M25571 Pain in right ankle and joints of right foot: Secondary | ICD-10-CM | POA: Diagnosis not present

## 2017-05-08 DIAGNOSIS — G8929 Other chronic pain: Secondary | ICD-10-CM

## 2017-05-08 DIAGNOSIS — M7989 Other specified soft tissue disorders: Secondary | ICD-10-CM | POA: Diagnosis not present

## 2017-05-08 NOTE — Telephone Encounter (Signed)
Yes, that is fine. 

## 2017-05-08 NOTE — Telephone Encounter (Signed)
Mickel Baas with [facility] left v/m requesting verbal order to continue [facility] PT 2 x a week for 4 weeks.

## 2017-05-08 NOTE — Telephone Encounter (Signed)
Spoke to [place] with verbal orders

## 2017-05-09 NOTE — Telephone Encounter (Signed)
See xray results. 

## 2017-05-09 NOTE — Telephone Encounter (Signed)
Pt husband, Nadara Mustard, called to get results from x-ray. He is requesting a cb.

## 2017-05-10 DIAGNOSIS — S72001D Fracture of unspecified part of neck of right femur, subsequent encounter for closed fracture with routine healing: Secondary | ICD-10-CM | POA: Diagnosis not present

## 2017-05-10 DIAGNOSIS — I10 Essential (primary) hypertension: Secondary | ICD-10-CM | POA: Diagnosis not present

## 2017-05-10 DIAGNOSIS — I69354 Hemiplegia and hemiparesis following cerebral infarction affecting left non-dominant side: Secondary | ICD-10-CM | POA: Diagnosis not present

## 2017-05-10 DIAGNOSIS — L989 Disorder of the skin and subcutaneous tissue, unspecified: Secondary | ICD-10-CM | POA: Diagnosis not present

## 2017-05-10 DIAGNOSIS — E785 Hyperlipidemia, unspecified: Secondary | ICD-10-CM | POA: Diagnosis not present

## 2017-05-10 DIAGNOSIS — D510 Vitamin B12 deficiency anemia due to intrinsic factor deficiency: Secondary | ICD-10-CM | POA: Diagnosis not present

## 2017-05-12 DIAGNOSIS — I10 Essential (primary) hypertension: Secondary | ICD-10-CM | POA: Diagnosis not present

## 2017-05-12 DIAGNOSIS — I69354 Hemiplegia and hemiparesis following cerebral infarction affecting left non-dominant side: Secondary | ICD-10-CM | POA: Diagnosis not present

## 2017-05-12 DIAGNOSIS — Z9181 History of falling: Secondary | ICD-10-CM | POA: Diagnosis not present

## 2017-05-12 DIAGNOSIS — M25571 Pain in right ankle and joints of right foot: Secondary | ICD-10-CM | POA: Diagnosis not present

## 2017-05-12 DIAGNOSIS — E785 Hyperlipidemia, unspecified: Secondary | ICD-10-CM

## 2017-05-12 DIAGNOSIS — E669 Obesity, unspecified: Secondary | ICD-10-CM | POA: Diagnosis not present

## 2017-05-12 DIAGNOSIS — M1991 Primary osteoarthritis, unspecified site: Secondary | ICD-10-CM | POA: Diagnosis not present

## 2017-05-12 DIAGNOSIS — F39 Unspecified mood [affective] disorder: Secondary | ICD-10-CM

## 2017-05-12 DIAGNOSIS — D510 Vitamin B12 deficiency anemia due to intrinsic factor deficiency: Secondary | ICD-10-CM | POA: Diagnosis not present

## 2017-05-12 DIAGNOSIS — M1990 Unspecified osteoarthritis, unspecified site: Secondary | ICD-10-CM | POA: Diagnosis not present

## 2017-05-13 ENCOUNTER — Ambulatory Visit (INDEPENDENT_AMBULATORY_CARE_PROVIDER_SITE_OTHER): Payer: Medicare Other | Admitting: Internal Medicine

## 2017-05-13 ENCOUNTER — Encounter: Payer: Self-pay | Admitting: Radiology

## 2017-05-13 ENCOUNTER — Encounter: Payer: Self-pay | Admitting: Internal Medicine

## 2017-05-13 VITALS — BP 120/70 | HR 87 | Temp 97.6°F | Wt 145.0 lb

## 2017-05-13 DIAGNOSIS — D510 Vitamin B12 deficiency anemia due to intrinsic factor deficiency: Secondary | ICD-10-CM | POA: Diagnosis not present

## 2017-05-13 DIAGNOSIS — S72001S Fracture of unspecified part of neck of right femur, sequela: Secondary | ICD-10-CM

## 2017-05-13 DIAGNOSIS — G8929 Other chronic pain: Secondary | ICD-10-CM | POA: Diagnosis not present

## 2017-05-13 DIAGNOSIS — E785 Hyperlipidemia, unspecified: Secondary | ICD-10-CM | POA: Diagnosis not present

## 2017-05-13 DIAGNOSIS — M25571 Pain in right ankle and joints of right foot: Secondary | ICD-10-CM | POA: Diagnosis not present

## 2017-05-13 DIAGNOSIS — I10 Essential (primary) hypertension: Secondary | ICD-10-CM | POA: Diagnosis not present

## 2017-05-13 DIAGNOSIS — M79661 Pain in right lower leg: Secondary | ICD-10-CM

## 2017-05-13 DIAGNOSIS — M1991 Primary osteoarthritis, unspecified site: Secondary | ICD-10-CM | POA: Diagnosis not present

## 2017-05-13 DIAGNOSIS — I69354 Hemiplegia and hemiparesis following cerebral infarction affecting left non-dominant side: Secondary | ICD-10-CM | POA: Diagnosis not present

## 2017-05-13 MED ORDER — HYDROCODONE-ACETAMINOPHEN 5-325 MG PO TABS: 1.0000 | ORAL_TABLET | Freq: Four times a day (QID) | ORAL | 0 refills | Status: DC | PRN

## 2017-05-13 MED ORDER — CLOPIDOGREL BISULFATE 75 MG PO TABS: 75.0000 mg | ORAL_TABLET | Freq: Every day | ORAL | 3 refills | Status: DC

## 2017-05-13 NOTE — Progress Notes (Signed)
Pre visit review using our clinic review tool, if applicable. No additional management support is needed unless otherwise documented below in the visit note. 

## 2017-05-13 NOTE — Addendum Note (Signed)
Addended by: Ellamae Sia on: 05/13/2017 12:20 PM   Modules accepted: Orders

## 2017-05-13 NOTE — Progress Notes (Signed)
Subjective:    Patient ID: Mallory Rodriguez, female    DOB: [birth year], 83 y.o.   MRN: [id number]  HPI Here with husband for ongoing right ankle pain  She apologizes for her behavior the last time No DVT X-ray didn't show any bony abnormalities  Still swollen Worsens as the day goes on Trouble getting her strength to even move her leg forward once she gets up  Pain behind her right knee The swelling has been there since the hip fracture  Current Outpatient Prescriptions on File Prior to Visit  Medication Sig Dispense Refill  . acetaminophen (TYLENOL) 650 MG CR tablet Take 650 mg by mouth 3 (three) times daily.    Marland Kitchen aspirin EC 325 MG EC tablet Take 1 tablet (325 mg total) by mouth daily with breakfast. 30 tablet 0  . CALCIUM-VITAMIN D-VITAMIN K PO Take 1 each by mouth daily.    . clopidogrel (PLAVIX) 75 MG tablet TAKE 1 TABLET BY MOUTH EVERY DAY 30 tablet 2  . cyanocobalamin (,VITAMIN B-12,) 1000 MCG/ML injection Inject 1,000 mcg into the muscle every 3 (three) months.    . EPINEPHrine (EPIPEN JR) 0.15 MG/0.3ML injection INJECT ONE SYRINGEFUL INTRAMUSCULARLY AS NEEDED FOR  ANAPHYLAXIS 2 each 0  . ferrous sulfate 325 (65 FE) MG tablet Take 1 tablet (325 mg total) by mouth 2 (two) times daily with a meal. 60 tablet 3  . hydrochlorothiazide (HYDRODIURIL) 25 MG tablet TAKE ONE TABLET BY MOUTH DAILY. 30 tablet 5  . lovastatin (MEVACOR) 40 MG tablet Take 1 tablet (40 mg total) by mouth at bedtime. 90 tablet 3  . nitroGLYCERIN (NITROLINGUAL) 0.4 MG/SPRAY spray PLACE ONE SPRAY UNDER THE TONGUE EVERY FIVE MINUTES AS NEEDED FOR CHEST PAIN 5 g 0  . Omega-3 Fatty Acids (FISH OIL) 1200 MG CAPS Take 1 capsule by mouth daily.    Vladimir Faster Glycol-Propyl Glycol (SYSTANE OP) Apply 1 drop to eye as needed (for dry eyes).    . polyethylene glycol (MIRALAX / GLYCOLAX) packet Take 17 g by mouth daily as needed for mild constipation. 14 each 0  . Probiotic Product (ALIGN) 4 MG CAPS Take 1 capsule by mouth  daily.     No current facility-administered medications on file prior to visit.     Allergies  Allergen Reactions  . Bee Venom Anaphylaxis  . Contrast Media [Iodinated Diagnostic Agents] Hives  . Lipitor [Atorvastatin] Swelling  . Ace Inhibitors Swelling  . Hydrocodone Other (See Comments)    Reaction:  Unknown   . Macrodantin Other (See Comments)    Reaction:  Chest pain   . Neosporin [Neomycin-Bacitracin Zn-Polymyx] Other (See Comments)    Reaction:  Burning   . Sulfa Antibiotics Hives  . Epinephrine Palpitations  . Prednisone Rash and Other (See Comments)    Reaction:  Dizziness and headache     Past Medical History:  Diagnosis Date  . AP (angina pectoris) ([place])   . Claustrophobia   . Diverticulosis of colon   . Hiatal hernia   . HLD (hyperlipidemia)   . HTN (hypertension)   . Hypogammaglobulinemia ([place])    borderline  . MVP (mitral valve prolapse)    and tricuspid leak  . OA (osteoarthritis)   . Pelvic kidney    lower left side  . Pernicious anemia   . Sciatica   . TIA (transient ischemic attack) 8/16    Past Surgical History:  Procedure Laterality Date  . APPENDECTOMY    . BASAL CELL CARCINOMA EXCISION  Back  . BREAST BIOPSY Right    neg  . BREAST LUMPECTOMY     Right (fatty tumor)  . CATARACT EXTRACTION W/ INTRAOCULAR LENS  IMPLANT, BILATERAL Bilateral   . CHOLECYSTECTOMY OPEN    . DILATION AND CURETTAGE OF UTERUS    . excision vestibular glands    . Film removed Left eye    . INGUINAL HERNIA REPAIR     Left side  . INTRAMEDULLARY (IM) NAIL INTERTROCHANTERIC Right 01/14/2017   Procedure: ORIF RIGHT INTERTROCH NAIL;  Surgeon: Paralee Cancel, MD;  Location: [place];  Service: Orthopedics;  Laterality: Right;  . KNEE ARTHROSCOPY W/ PARTIAL MEDIAL MENISCECTOMY  08/2009   right  . Right ovary and tube corpus    . TONSILLECTOMY    . TOTAL ABDOMINAL HYSTERECTOMY      Family History  Problem Relation Age of Onset  . Heart attack Father 57       CABG    . Hypertension Father   . Dementia Mother   . Cancer Maternal Aunt        Breast-multiple aunts and Gm  . Cancer Maternal Uncle        Bladder and lung  . Diabetes Paternal Grandmother   . Hypertension Brother     Social History   Social History  . Marital status: Married    Spouse name: N/A  . Number of children: 2  . Years of education: N/A   Occupational History  . Retired-bookkeeping/secretarial work     retired  .      Social History Main Topics  . Smoking status: Never Smoker  . Smokeless tobacco: Never Used  . Alcohol use 1.8 oz/week    3 Glasses of wine per week  . Drug use: No  . Sexual activity: Not on file   Other Topics Concern  . Not on file   Social History Narrative   Has living will and advance directives.    Requests husband, then daughter to make health care decisions.   Would accept resuscitation but no life prolonging technology if cognitively unaware   Review of Systems  Sleeps fitfully Appetite remains poor Still having ongoing pain in right hip also--- has hydrocodone but hasn't taken it     Objective:   Physical Exam  Constitutional: No distress.  Musculoskeletal:  Mild swelling at right ankle but markedly improved No malleolar tenderness Fair ROM  Has pain with ROM of right hip          Assessment & Plan:

## 2017-05-13 NOTE — Assessment & Plan Note (Signed)
This is much better Likely had concomitant injury with initial fall No DVT X-ray okay No further eval for now

## 2017-05-13 NOTE — Assessment & Plan Note (Signed)
Ongoing pain is limiting her recovery Will restart narcotic---had oxycodone from Dr Alvan Dame which did help No other Rx on CSRS Will check UDS---may need chronically Will try hydrocodone instead

## 2017-05-15 DIAGNOSIS — M1991 Primary osteoarthritis, unspecified site: Secondary | ICD-10-CM | POA: Diagnosis not present

## 2017-05-15 DIAGNOSIS — E785 Hyperlipidemia, unspecified: Secondary | ICD-10-CM | POA: Diagnosis not present

## 2017-05-15 DIAGNOSIS — D510 Vitamin B12 deficiency anemia due to intrinsic factor deficiency: Secondary | ICD-10-CM | POA: Diagnosis not present

## 2017-05-15 DIAGNOSIS — M25571 Pain in right ankle and joints of right foot: Secondary | ICD-10-CM | POA: Diagnosis not present

## 2017-05-15 DIAGNOSIS — I69354 Hemiplegia and hemiparesis following cerebral infarction affecting left non-dominant side: Secondary | ICD-10-CM | POA: Diagnosis not present

## 2017-05-15 DIAGNOSIS — I10 Essential (primary) hypertension: Secondary | ICD-10-CM | POA: Diagnosis not present

## 2017-05-16 ENCOUNTER — Ambulatory Visit: Payer: Medicare Other | Admitting: Internal Medicine

## 2017-05-17 LAB — TOXASSURE SELECT 13 (MW), URINE

## 2017-05-22 DIAGNOSIS — E785 Hyperlipidemia, unspecified: Secondary | ICD-10-CM | POA: Diagnosis not present

## 2017-05-22 DIAGNOSIS — M25571 Pain in right ankle and joints of right foot: Secondary | ICD-10-CM | POA: Diagnosis not present

## 2017-05-22 DIAGNOSIS — M1991 Primary osteoarthritis, unspecified site: Secondary | ICD-10-CM | POA: Diagnosis not present

## 2017-05-22 DIAGNOSIS — D510 Vitamin B12 deficiency anemia due to intrinsic factor deficiency: Secondary | ICD-10-CM | POA: Diagnosis not present

## 2017-05-22 DIAGNOSIS — I10 Essential (primary) hypertension: Secondary | ICD-10-CM | POA: Diagnosis not present

## 2017-05-22 DIAGNOSIS — I69354 Hemiplegia and hemiparesis following cerebral infarction affecting left non-dominant side: Secondary | ICD-10-CM | POA: Diagnosis not present

## 2017-05-24 DIAGNOSIS — M1991 Primary osteoarthritis, unspecified site: Secondary | ICD-10-CM | POA: Diagnosis not present

## 2017-05-24 DIAGNOSIS — D510 Vitamin B12 deficiency anemia due to intrinsic factor deficiency: Secondary | ICD-10-CM | POA: Diagnosis not present

## 2017-05-24 DIAGNOSIS — M25571 Pain in right ankle and joints of right foot: Secondary | ICD-10-CM | POA: Diagnosis not present

## 2017-05-24 DIAGNOSIS — E785 Hyperlipidemia, unspecified: Secondary | ICD-10-CM | POA: Diagnosis not present

## 2017-05-24 DIAGNOSIS — I10 Essential (primary) hypertension: Secondary | ICD-10-CM | POA: Diagnosis not present

## 2017-05-24 DIAGNOSIS — I69354 Hemiplegia and hemiparesis following cerebral infarction affecting left non-dominant side: Secondary | ICD-10-CM | POA: Diagnosis not present

## 2017-05-28 ENCOUNTER — Ambulatory Visit: Payer: Medicare Other | Admitting: Internal Medicine

## 2017-05-29 DIAGNOSIS — D510 Vitamin B12 deficiency anemia due to intrinsic factor deficiency: Secondary | ICD-10-CM | POA: Diagnosis not present

## 2017-05-29 DIAGNOSIS — I69354 Hemiplegia and hemiparesis following cerebral infarction affecting left non-dominant side: Secondary | ICD-10-CM | POA: Diagnosis not present

## 2017-05-29 DIAGNOSIS — I10 Essential (primary) hypertension: Secondary | ICD-10-CM | POA: Diagnosis not present

## 2017-05-29 DIAGNOSIS — M1991 Primary osteoarthritis, unspecified site: Secondary | ICD-10-CM | POA: Diagnosis not present

## 2017-05-29 DIAGNOSIS — E785 Hyperlipidemia, unspecified: Secondary | ICD-10-CM | POA: Diagnosis not present

## 2017-05-29 DIAGNOSIS — M25571 Pain in right ankle and joints of right foot: Secondary | ICD-10-CM | POA: Diagnosis not present

## 2017-05-31 DIAGNOSIS — I69354 Hemiplegia and hemiparesis following cerebral infarction affecting left non-dominant side: Secondary | ICD-10-CM | POA: Diagnosis not present

## 2017-05-31 DIAGNOSIS — M25571 Pain in right ankle and joints of right foot: Secondary | ICD-10-CM | POA: Diagnosis not present

## 2017-05-31 DIAGNOSIS — D510 Vitamin B12 deficiency anemia due to intrinsic factor deficiency: Secondary | ICD-10-CM | POA: Diagnosis not present

## 2017-05-31 DIAGNOSIS — I10 Essential (primary) hypertension: Secondary | ICD-10-CM | POA: Diagnosis not present

## 2017-05-31 DIAGNOSIS — M1991 Primary osteoarthritis, unspecified site: Secondary | ICD-10-CM | POA: Diagnosis not present

## 2017-05-31 DIAGNOSIS — E785 Hyperlipidemia, unspecified: Secondary | ICD-10-CM | POA: Diagnosis not present

## 2017-06-03 DIAGNOSIS — I69354 Hemiplegia and hemiparesis following cerebral infarction affecting left non-dominant side: Secondary | ICD-10-CM | POA: Diagnosis not present

## 2017-06-03 DIAGNOSIS — M1991 Primary osteoarthritis, unspecified site: Secondary | ICD-10-CM | POA: Diagnosis not present

## 2017-06-03 DIAGNOSIS — D510 Vitamin B12 deficiency anemia due to intrinsic factor deficiency: Secondary | ICD-10-CM | POA: Diagnosis not present

## 2017-06-03 DIAGNOSIS — M25571 Pain in right ankle and joints of right foot: Secondary | ICD-10-CM | POA: Diagnosis not present

## 2017-06-03 DIAGNOSIS — I10 Essential (primary) hypertension: Secondary | ICD-10-CM | POA: Diagnosis not present

## 2017-06-03 DIAGNOSIS — E785 Hyperlipidemia, unspecified: Secondary | ICD-10-CM | POA: Diagnosis not present

## 2017-06-05 ENCOUNTER — Telehealth: Payer: Self-pay | Admitting: *Deleted

## 2017-06-05 DIAGNOSIS — M25571 Pain in right ankle and joints of right foot: Secondary | ICD-10-CM | POA: Diagnosis not present

## 2017-06-05 DIAGNOSIS — E785 Hyperlipidemia, unspecified: Secondary | ICD-10-CM | POA: Diagnosis not present

## 2017-06-05 DIAGNOSIS — I10 Essential (primary) hypertension: Secondary | ICD-10-CM | POA: Diagnosis not present

## 2017-06-05 DIAGNOSIS — M1991 Primary osteoarthritis, unspecified site: Secondary | ICD-10-CM | POA: Diagnosis not present

## 2017-06-05 DIAGNOSIS — D510 Vitamin B12 deficiency anemia due to intrinsic factor deficiency: Secondary | ICD-10-CM | POA: Diagnosis not present

## 2017-06-05 DIAGNOSIS — I69354 Hemiplegia and hemiparesis following cerebral infarction affecting left non-dominant side: Secondary | ICD-10-CM | POA: Diagnosis not present

## 2017-06-05 NOTE — Telephone Encounter (Signed)
Mallory Rodriguez left a voicemail stating that patient is being discharged from home health physical therapy and wanted to let Dr. Silvio Pate know that.

## 2017-06-06 NOTE — Telephone Encounter (Signed)
noted 

## 2017-06-12 ENCOUNTER — Encounter: Payer: Self-pay | Admitting: Internal Medicine

## 2017-06-12 ENCOUNTER — Ambulatory Visit (INDEPENDENT_AMBULATORY_CARE_PROVIDER_SITE_OTHER): Payer: Medicare Other | Admitting: Internal Medicine

## 2017-06-12 VITALS — BP 130/70 | HR 63 | Temp 97.6°F | Wt 144.2 lb

## 2017-06-12 DIAGNOSIS — M79661 Pain in right lower leg: Secondary | ICD-10-CM | POA: Diagnosis not present

## 2017-06-12 DIAGNOSIS — I69959 Hemiplegia and hemiparesis following unspecified cerebrovascular disease affecting unspecified side: Secondary | ICD-10-CM | POA: Diagnosis not present

## 2017-06-12 DIAGNOSIS — S72001S Fracture of unspecified part of neck of right femur, sequela: Secondary | ICD-10-CM

## 2017-06-12 MED ORDER — HYDROCODONE-ACETAMINOPHEN 5-325 MG PO TABS: 1.0000 | ORAL_TABLET | Freq: Three times a day (TID) | ORAL | 0 refills | Status: DC | PRN

## 2017-06-12 NOTE — Assessment & Plan Note (Signed)
Better Still unclear diagnosis Will continue the hydrocodone for bedtime

## 2017-06-12 NOTE — Assessment & Plan Note (Signed)
Mild weakness persists---hard to distinguish sequelae of fracture (at least for leg part) On statin and plavix

## 2017-06-12 NOTE — Progress Notes (Signed)
Subjective:    Patient ID: Mallory Rodriguez, female    DOB: [birth year], 83 y.o.   MRN: [id number]  HPI Here for follow up of pain  She is better with the hydrocodone Taking it only at night---it helps her sleep Has been discharged from the therapy Walks with walker Doesn't shower (it is upstairs)---only sponge baths (can't get up the stairs) Able to dress and use bathroom. Now able to do bra also  Still has some right foot and calf pain Will swell some at night  Still with right side weakness from the stroke Hard getting in the car still The fracture just worsened this  Current Outpatient Prescriptions on File Prior to Visit  Medication Sig Dispense Refill  . acetaminophen (TYLENOL) 650 MG CR tablet Take 650 mg by mouth 3 (three) times daily.    Marland Kitchen aspirin EC 325 MG EC tablet Take 1 tablet (325 mg total) by mouth daily with breakfast. 30 tablet 0  . CALCIUM-VITAMIN D-VITAMIN K PO Take 1 each by mouth daily.    . clopidogrel (PLAVIX) 75 MG tablet Take 1 tablet (75 mg total) by mouth daily. 90 tablet 3  . cyanocobalamin (,VITAMIN B-12,) 1000 MCG/ML injection Inject 1,000 mcg into the muscle every 3 (three) months.    . EPINEPHrine (EPIPEN JR) 0.15 MG/0.3ML injection INJECT ONE SYRINGEFUL INTRAMUSCULARLY AS NEEDED FOR  ANAPHYLAXIS 2 each 0  . hydrochlorothiazide (HYDRODIURIL) 25 MG tablet TAKE ONE TABLET BY MOUTH DAILY. 30 tablet 5  . HYDROcodone-acetaminophen (NORCO/VICODIN) 5-325 MG tablet Take 1 tablet by mouth every 6 (six) hours as needed for moderate pain. 20 tablet 0  . lovastatin (MEVACOR) 40 MG tablet Take 1 tablet (40 mg total) by mouth at bedtime. 90 tablet 3  . nitroGLYCERIN (NITROLINGUAL) 0.4 MG/SPRAY spray PLACE ONE SPRAY UNDER THE TONGUE EVERY FIVE MINUTES AS NEEDED FOR CHEST PAIN 5 g 0  . Omega-3 Fatty Acids (FISH OIL) 1200 MG CAPS Take 1 capsule by mouth daily.    Vladimir Faster Glycol-Propyl Glycol (SYSTANE OP) Apply 1 drop to eye as needed (for dry eyes).    .  polyethylene glycol (MIRALAX / GLYCOLAX) packet Take 17 g by mouth daily as needed for mild constipation. 14 each 0  . Probiotic Product (ALIGN) 4 MG CAPS Take 1 capsule by mouth daily.     No current facility-administered medications on file prior to visit.     Allergies  Allergen Reactions  . Bee Venom Anaphylaxis  . Contrast Media [Iodinated Diagnostic Agents] Hives  . Lipitor [Atorvastatin] Swelling  . Ace Inhibitors Swelling  . Hydrocodone Other (See Comments)    Reaction:  Unknown   . Macrodantin Other (See Comments)    Reaction:  Chest pain   . Neosporin [Neomycin-Bacitracin Zn-Polymyx] Other (See Comments)    Reaction:  Burning   . Sulfa Antibiotics Hives  . Epinephrine Palpitations  . Prednisone Rash and Other (See Comments)    Reaction:  Dizziness and headache     Past Medical History:  Diagnosis Date  . AP (angina pectoris) ([place])   . Claustrophobia   . Diverticulosis of colon   . Hiatal hernia   . HLD (hyperlipidemia)   . HTN (hypertension)   . Hypogammaglobulinemia ([place])    borderline  . MVP (mitral valve prolapse)    and tricuspid leak  . OA (osteoarthritis)   . Pelvic kidney    lower left side  . Pernicious anemia   . Sciatica   . TIA (transient ischemic attack)  8/16    Past Surgical History:  Procedure Laterality Date  . APPENDECTOMY    . BASAL CELL CARCINOMA EXCISION     Back  . BREAST BIOPSY Right    neg  . BREAST LUMPECTOMY     Right (fatty tumor)  . CATARACT EXTRACTION W/ INTRAOCULAR LENS  IMPLANT, BILATERAL Bilateral   . CHOLECYSTECTOMY OPEN    . DILATION AND CURETTAGE OF UTERUS    . excision vestibular glands    . Film removed Left eye    . INGUINAL HERNIA REPAIR     Left side  . INTRAMEDULLARY (IM) NAIL INTERTROCHANTERIC Right 01/14/2017   Procedure: ORIF RIGHT INTERTROCH NAIL;  Surgeon: Paralee Cancel, MD;  Location: [facility];  Service: Orthopedics;  Laterality: Right;  . KNEE ARTHROSCOPY W/ PARTIAL MEDIAL MENISCECTOMY  08/2009   right    . Right ovary and tube corpus    . TONSILLECTOMY    . TOTAL ABDOMINAL HYSTERECTOMY      Family History  Problem Relation Age of Onset  . Heart attack Father 31       CABG  . Hypertension Father   . Dementia Mother   . Cancer Maternal Aunt        Breast-multiple aunts and Gm  . Cancer Maternal Uncle        Bladder and lung  . Diabetes Paternal Grandmother   . Hypertension Brother     Social History   Social History  . Marital status: Married    Spouse name: N/A  . Number of children: 2  . Years of education: N/A   Occupational History  . Retired-bookkeeping/secretarial work     retired  .      Social History Main Topics  . Smoking status: Never Smoker  . Smokeless tobacco: Never Used  . Alcohol use 1.8 oz/week    3 Glasses of wine per week  . Drug use: No  . Sexual activity: Not on file   Other Topics Concern  . Not on file   Social History Narrative   Has living will and advance directives.    Requests husband, then daughter to make health care decisions.   Would accept resuscitation but no life prolonging technology if cognitively unaware   Review of Systems Appetite is not great Weight is about the same    Objective:   Physical Exam  Musculoskeletal:  Minimal right calf swelling--slight tenderness. No foot tenderness Internal rotation of right hip is fair-- limited external rotation  Psychiatric:  Smiling now (but calls it a "fake smile")          Assessment & Plan:

## 2017-06-12 NOTE — Assessment & Plan Note (Signed)
Improved Done with therapy Still needs assistance and walker

## 2017-07-04 ENCOUNTER — Other Ambulatory Visit: Payer: Self-pay | Admitting: Internal Medicine

## 2017-07-04 MED ORDER — HYDROCHLOROTHIAZIDE 25 MG PO TABS: 25.0000 mg | ORAL_TABLET | Freq: Every day | ORAL | 1 refills | Status: DC

## 2017-07-10 DIAGNOSIS — Z4789 Encounter for other orthopedic aftercare: Secondary | ICD-10-CM | POA: Diagnosis not present

## 2017-07-10 DIAGNOSIS — M25551 Pain in right hip: Secondary | ICD-10-CM | POA: Diagnosis not present

## 2017-07-10 DIAGNOSIS — S72141D Displaced intertrochanteric fracture of right femur, subsequent encounter for closed fracture with routine healing: Secondary | ICD-10-CM | POA: Diagnosis not present

## 2017-07-25 ENCOUNTER — Other Ambulatory Visit: Payer: Self-pay | Admitting: Cardiovascular Disease

## 2017-07-25 NOTE — Telephone Encounter (Signed)
3 attempts to schedule fu appt from recall list.   Deleting recall.   

## 2017-08-18 ENCOUNTER — Other Ambulatory Visit: Payer: Self-pay | Admitting: Internal Medicine

## 2017-08-19 NOTE — Telephone Encounter (Signed)
Approved:okay #1 x 1 as pended

## 2017-08-19 NOTE — Telephone Encounter (Signed)
Patient Last OV:  06/12/17  Last Refill:  X1 in July 2016.  Will pend refill X 1 for Dr. Silvio Pate approval for patient to continue.

## 2017-09-13 ENCOUNTER — Ambulatory Visit: Payer: Medicare Other | Admitting: Internal Medicine

## 2017-10-10 ENCOUNTER — Ambulatory Visit: Payer: Medicare Other

## 2017-10-18 ENCOUNTER — Ambulatory Visit (INDEPENDENT_AMBULATORY_CARE_PROVIDER_SITE_OTHER): Payer: Medicare Other

## 2017-10-18 DIAGNOSIS — Z23 Encounter for immunization: Secondary | ICD-10-CM

## 2017-11-04 DIAGNOSIS — L570 Actinic keratosis: Secondary | ICD-10-CM | POA: Diagnosis not present

## 2017-11-04 DIAGNOSIS — L080 Pyoderma: Secondary | ICD-10-CM | POA: Diagnosis not present

## 2017-11-04 DIAGNOSIS — L814 Other melanin hyperpigmentation: Secondary | ICD-10-CM | POA: Diagnosis not present

## 2017-11-04 DIAGNOSIS — D1800 Hemangioma unspecified site: Secondary | ICD-10-CM | POA: Diagnosis not present

## 2017-11-04 DIAGNOSIS — L578 Other skin changes due to chronic exposure to nonionizing radiation: Secondary | ICD-10-CM | POA: Diagnosis not present

## 2017-11-04 DIAGNOSIS — L821 Other seborrheic keratosis: Secondary | ICD-10-CM | POA: Diagnosis not present

## 2017-11-04 DIAGNOSIS — Z1283 Encounter for screening for malignant neoplasm of skin: Secondary | ICD-10-CM | POA: Diagnosis not present

## 2017-11-04 DIAGNOSIS — D485 Neoplasm of uncertain behavior of skin: Secondary | ICD-10-CM | POA: Diagnosis not present

## 2017-12-20 ENCOUNTER — Encounter: Payer: Medicare Other | Admitting: Internal Medicine

## 2018-01-01 ENCOUNTER — Telehealth: Payer: Self-pay | Admitting: Internal Medicine

## 2018-01-01 NOTE — Telephone Encounter (Signed)
Message left for patient to return my call.  

## 2018-01-01 NOTE — Telephone Encounter (Signed)
Please have her hold plavix 3 days prior to tooth extraction, then resume 24 hours after.

## 2018-01-01 NOTE — Telephone Encounter (Signed)
Copied from [place]. Topic: Quick Communication - See Telephone Encounter >> Jan 01, 2018  4:04 PM Arletha Grippe wrote: CRM for notification. See Telephone encounter for:   01/01/18. Pt would like to know what medications clopidogrel and lovastatin are for. She needs to know id she can take them while getting a dental extraction.  Please call pt back at [phone] or [phone] She is not able to make dental appt until she can figure out the medications.

## 2018-01-01 NOTE — Telephone Encounter (Signed)
Should pt be off plavix prior to dental extraction; if so when to stop med prior to extraction and when to restart the plavix after extraction. Dr Silvio Pate out of office with no computer access.Please advise.

## 2018-01-02 NOTE — Telephone Encounter (Signed)
Spoken and notified patient of Kate's comments. Patient verbalized understanding. 

## 2018-01-10 ENCOUNTER — Ambulatory Visit (INDEPENDENT_AMBULATORY_CARE_PROVIDER_SITE_OTHER): Payer: Medicare Other | Admitting: Internal Medicine

## 2018-01-10 ENCOUNTER — Encounter: Payer: Self-pay | Admitting: Internal Medicine

## 2018-01-10 VITALS — BP 122/64 | HR 61 | Temp 97.8°F | Ht 61.0 in | Wt 140.0 lb

## 2018-01-10 DIAGNOSIS — I10 Essential (primary) hypertension: Secondary | ICD-10-CM

## 2018-01-10 DIAGNOSIS — I69959 Hemiplegia and hemiparesis following unspecified cerebrovascular disease affecting unspecified side: Secondary | ICD-10-CM

## 2018-01-10 DIAGNOSIS — S72001S Fracture of unspecified part of neck of right femur, sequela: Secondary | ICD-10-CM

## 2018-01-10 DIAGNOSIS — F39 Unspecified mood [affective] disorder: Secondary | ICD-10-CM

## 2018-01-10 DIAGNOSIS — Z7189 Other specified counseling: Secondary | ICD-10-CM

## 2018-01-10 DIAGNOSIS — E785 Hyperlipidemia, unspecified: Secondary | ICD-10-CM

## 2018-01-10 DIAGNOSIS — Z0000 Encounter for general adult medical examination without abnormal findings: Secondary | ICD-10-CM

## 2018-01-10 LAB — COMPREHENSIVE METABOLIC PANEL
ALT: 11 U/L (ref 0–35)
AST: 17 U/L (ref 0–37)
Albumin: 4.3 g/dL (ref 3.5–5.2)
Alkaline Phosphatase: 46 U/L (ref 39–117)
BUN: 18 mg/dL (ref 6–23)
CALCIUM: 9.5 mg/dL (ref 8.4–10.5)
CHLORIDE: 100 meq/L (ref 96–112)
CO2: 30 meq/L (ref 19–32)
CREATININE: 0.79 mg/dL (ref 0.40–1.20)
GFR: 74.04 mL/min (ref >60.00)
Glucose, Bld: 97 mg/dL (ref 70–99)
Potassium: 3.3 mEq/L — ABNORMAL LOW (ref 3.5–5.1)
Sodium: 139 mEq/L (ref 135–145)
Total Bilirubin: 0.5 mg/dL (ref 0.2–1.2)
Total Protein: 7.4 g/dL (ref 6.0–8.3)

## 2018-01-10 LAB — CBC
HCT: 39.4 % (ref 36.0–46.0)
Hemoglobin: 12.7 g/dL (ref 12.0–15.0)
MCHC: 32.3 g/dL (ref 30.0–36.0)
MCV: 93.3 fl (ref 78.0–100.0)
Platelets: 247 10*3/uL (ref 150.0–400.0)
RBC: 4.22 Mil/uL (ref 3.87–5.11)
RDW: 14.5 % (ref 11.5–15.5)
WBC: 5.3 10*3/uL (ref 4.0–10.5)

## 2018-01-10 LAB — LIPID PANEL
Cholesterol: 251 mg/dL — ABNORMAL HIGH (ref 0–200)
HDL: 90.3 mg/dL (ref >39.00)
LDL CALC: 141 mg/dL — AB (ref 0–99)
NonHDL: 160.57
TRIGLYCERIDES: 98 mg/dL (ref 0.0–149.0)
Total CHOL/HDL Ratio: 3
VLDL: 19.6 mg/dL (ref 0.0–40.0)

## 2018-01-10 LAB — T4, FREE: FREE T4: 0.83 ng/dL (ref 0.60–1.60)

## 2018-01-10 MED ORDER — TETANUS-DIPHTHERIA TOXOIDS TD 5-2 LFU IM INJ: 0.5000 mL | INJECTION | Freq: Once | INTRAMUSCULAR | 0 refills | Status: AC

## 2018-01-10 NOTE — Progress Notes (Signed)
Visual Acuity Screening   Right eye Left eye Both eyes  Without correction:     With correction: 20/20 20/13 20/13   Hearing Screening Comments: Has Hearing Aids. Not wearing them today

## 2018-01-10 NOTE — Progress Notes (Signed)
Subjective:    Patient ID: Mallory Rodriguez, female    DOB: Feb 16, 1933, 84 y.o.   MRN: [id number]  HPI Here for Medicare wellness visit and follow up of chronic health conditions Reviewed form and advanced directives Reviewed other doctors Occasional glass of wine No tobacco Broken hip just about a year ago--no other falls Chronic mood problems Does ADLs but not much for instrumental ADLs; (stand by assist for showering) Feels her memory is poor  Still has to use walker Limited in activities--no driving, doesn't do housework (but helps with dishes) They now live with daughter  Still some mood issues Has regular sad feelings, "but I keep them under control" Selling all her furniture and now the house Not anhedonic---but "I don't have much to do"  Ongoing right side weakness---mostly in the arm/hand Goes back to stroke Right arm feels cold also  No chest pain No SOB No dizziness or syncope Mild edema---right ankle. No change  Current Outpatient Medications on File Prior to Visit  Medication Sig Dispense Refill  . acetaminophen (TYLENOL) 650 MG CR tablet Take 650 mg by mouth 3 (three) times daily.    Marland Kitchen aspirin EC 325 MG EC tablet Take 1 tablet (325 mg total) by mouth daily with breakfast. 30 tablet 0  . CALCIUM-VITAMIN D-VITAMIN K PO Take 1 each by mouth daily.    . clopidogrel (PLAVIX) 75 MG tablet Take 1 tablet (75 mg total) by mouth daily. 90 tablet 3  . cyanocobalamin (,VITAMIN B-12,) 1000 MCG/ML injection Inject 1,000 mcg into the muscle every 3 (three) months.    . EPINEPHrine (EPIPEN JR) 0.15 MG/0.3ML injection INJECT ONE SYRINGEFUL INTRAMUSCULARLY AS NEEDED FOR  ANAPHYLAXIS 2 each 0  . hydrochlorothiazide (HYDRODIURIL) 25 MG tablet Take 1 tablet (25 mg total) by mouth daily. 90 tablet 1  . lovastatin (MEVACOR) 40 MG tablet Take 1 tablet (40 mg total) by mouth at bedtime. 90 tablet 3  . nitroGLYCERIN (NITROLINGUAL) 0.4 MG/SPRAY spray PLACE ONE SPRAY UNDER THE TONGUE EVERY  FIVE MINUTES AS NEEDED FOR CHEST PAIN 4.9 g 1  . Omega-3 Fatty Acids (FISH OIL) 1200 MG CAPS Take 1 capsule by mouth daily.    Vladimir Faster Glycol-Propyl Glycol (SYSTANE OP) Apply 1 drop to eye as needed (for dry eyes).    . polyethylene glycol (MIRALAX / GLYCOLAX) packet Take 17 g by mouth daily as needed for mild constipation. 14 each 0  . Probiotic Product (ALIGN) 4 MG CAPS Take 1 capsule by mouth daily.     No current facility-administered medications on file prior to visit.     Allergies  Allergen Reactions  . Bee Venom Anaphylaxis  . Contrast Media [Iodinated Diagnostic Agents] Hives  . Lipitor [Atorvastatin] Swelling  . Ace Inhibitors Swelling  . Hydrocodone Other (See Comments)    Reaction:  Unknown   . Macrodantin Other (See Comments)    Reaction:  Chest pain   . Neosporin [Neomycin-Bacitracin Zn-Polymyx] Other (See Comments)    Reaction:  Burning   . Sulfa Antibiotics Hives  . Epinephrine Palpitations  . Prednisone Rash and Other (See Comments)    Reaction:  Dizziness and headache     Past Medical History:  Diagnosis Date  . AP (angina pectoris) ([place])   . Claustrophobia   . Diverticulosis of colon   . Hiatal hernia   . HLD (hyperlipidemia)   . HTN (hypertension)   . Hypogammaglobulinemia ([place])    borderline  . MVP (mitral valve prolapse)    and  tricuspid leak  . OA (osteoarthritis)   . Pelvic kidney    lower left side  . Pernicious anemia   . Sciatica   . TIA (transient ischemic attack) 8/16    Past Surgical History:  Procedure Laterality Date  . APPENDECTOMY    . BASAL CELL CARCINOMA EXCISION     Back  . BREAST BIOPSY Right    neg  . BREAST LUMPECTOMY     Right (fatty tumor)  . CATARACT EXTRACTION W/ INTRAOCULAR LENS  IMPLANT, BILATERAL Bilateral   . CHOLECYSTECTOMY OPEN    . DILATION AND CURETTAGE OF UTERUS    . excision vestibular glands    . Film removed Left eye    . INGUINAL HERNIA REPAIR     Left side  . INTRAMEDULLARY (IM) NAIL  INTERTROCHANTERIC Right 01/14/2017   Procedure: ORIF RIGHT INTERTROCH NAIL;  Surgeon: Paralee Cancel, MD;  Location: [facility];  Service: Orthopedics;  Laterality: Right;  . KNEE ARTHROSCOPY W/ PARTIAL MEDIAL MENISCECTOMY  08/2009   right  . Right ovary and tube corpus    . TONSILLECTOMY    . TOTAL ABDOMINAL HYSTERECTOMY      Family History  Problem Relation Age of Onset  . Heart attack Father 63       CABG  . Hypertension Father   . Dementia Mother   . Cancer Maternal Aunt        Breast-multiple aunts and Gm  . Cancer Maternal Uncle        Bladder and lung  . Diabetes Paternal Grandmother   . Hypertension Brother     Social History   Socioeconomic History  . Marital status: Married    Spouse name: Not on file  . Number of children: 2  . Years of education: Not on file  . Highest education level: Not on file  Social Needs  . Financial resource strain: Not on file  . Food insecurity - worry: Not on file  . Food insecurity - inability: Not on file  . Transportation needs - medical: Not on file  . Transportation needs - non-medical: Not on file  Occupational History  . Occupation: Retired-bookkeeping/secretarial work    Comment: retired  . Occupation:    Tobacco Use  . Smoking status: Never Smoker  . Smokeless tobacco: Never Used  Substance and Sexual Activity  . Alcohol use: Yes    Alcohol/week: 1.8 oz    Types: 3 Glasses of wine per week  . Drug use: No  . Sexual activity: Not on file  Other Topics Concern  . Not on file  Social History Narrative   Has living will and advance directives.    Requests husband, then daughter to make health care decisions.   Would accept resuscitation but no life prolonging technology if cognitively unaware   Review of Systems Recent dental extraction--keeps up with dentist Has dermatologist--doesn't remember name. No recent skin problems Appetite is okay--less than in past Weight fairly stable Wears seat belt Sleeps well---"too  much" Bowels are okay. No blood No urinary problems    Objective:   Physical Exam  Constitutional: She is oriented to person, place, and time. She appears well-developed. No distress.  Neck: No thyromegaly present.  Cardiovascular: Normal rate, regular rhythm and normal heart sounds. Exam reveals no gallop.  No murmur heard. 1+ pulse right foot, faint to absent on left  Pulmonary/Chest: Effort normal and breath sounds normal. No respiratory distress. She has no wheezes. She has no rales.  Abdominal:  Soft. There is no tenderness.  Musculoskeletal: She exhibits no edema or tenderness.  Lymphadenopathy:    She has no cervical adenopathy.  Neurological: She is alert and oriented to person, place, and time.  President--- "Dwaine Deter, Bush" 100- "I won't do the math" D-l-r-o-w Recall 3/3  Right side weakness--mostly arm Needs help getting up on step  Skin: No rash noted. No erythema.  Psychiatric: She has a normal mood and affect. Her behavior is normal.          Assessment & Plan:

## 2018-01-10 NOTE — Assessment & Plan Note (Signed)
Ongoing disability from this Now lives with daughter plavix.statin

## 2018-01-10 NOTE — Assessment & Plan Note (Signed)
See social history 

## 2018-01-10 NOTE — Assessment & Plan Note (Signed)
BP Readings from Last 3 Encounters:  01/10/18 122/64  06/12/17 130/70  05/13/17 120/70   Good control On HCTZ

## 2018-01-10 NOTE — Assessment & Plan Note (Signed)
Chronic dysthymia meds not indicated

## 2018-01-10 NOTE — Patient Instructions (Signed)
Please get your tetanus booster at [facility].

## 2018-01-10 NOTE — Assessment & Plan Note (Signed)
Statin for secondary prevention

## 2018-01-10 NOTE — Assessment & Plan Note (Signed)
Still affects her [place] Rile to get around Discussed starting actonel--she will not take it

## 2018-01-10 NOTE — Assessment & Plan Note (Signed)
I have personally reviewed the Medicare Annual Wellness questionnaire and have noted 1. The patient's medical and social history 2. Their use of alcohol, tobacco or illicit drugs 3. Their current medications and supplements 4. The patient's functional ability including ADL's, fall risks, home safety risks and hearing or visual             impairment. 5. Diet and physical activities 6. Evidence for depression or mood disorders  The patients weight, height, BMI and visual acuity have been recorded in the chart I have made referrals, counseling and provided education to the patient based review of the above and I have provided the pt with a written personalized care plan for preventive services.  I have provided you with a copy of your personalized plan for preventive services. Please take the time to review along with your updated medication list.  No cancer screening due to age Rx for Td Unable to really exercise

## 2018-01-12 NOTE — Progress Notes (Addendum)
   Subjective:   Patient ID: Mallory Rodriguez, female    DOB: 08/04/1933, 84 y.o.   MRN: [id number]  HPI    Review of Systems    Objective:   Physical Exam       Assessment & Plan:

## 2018-01-23 ENCOUNTER — Other Ambulatory Visit: Payer: Self-pay | Admitting: Internal Medicine

## 2018-02-07 ENCOUNTER — Other Ambulatory Visit: Payer: Self-pay | Admitting: Internal Medicine

## 2018-04-01 ENCOUNTER — Other Ambulatory Visit: Payer: Self-pay | Admitting: Internal Medicine

## 2018-04-01 MED ORDER — EPINEPHRINE 0.15 MG/0.3ML IJ SOAJ: INTRAMUSCULAR | 0 refills | Status: DC

## 2018-04-01 NOTE — Telephone Encounter (Signed)
Copied from [place] [id number] [phone]. Topic: Quick Communication - Rx Refill/Question >> Apr 01, 2018 10:18 AM Cecelia Byars, NT wrote: Medication: EPINEPHrine EPIPEN JR 0.15 MG/0.3ML injection Has the patient contacted their pharmacy? yes  (Agent: If no, request that the patient contact the pharmacy for the refill. Preferred Pharmacy (with phone number or street name): [facility], [place] - [place] [phone] Phone [phone] Fax Agent: Please be advised that RX refills may take up to 3 business days. We ask that you follow-up with your pharmacy.

## 2018-04-01 NOTE — Telephone Encounter (Signed)
LOV 01/10/18 Dr. Silvio Pate [address]

## 2018-04-29 DIAGNOSIS — Z961 Presence of intraocular lens: Secondary | ICD-10-CM | POA: Diagnosis not present

## 2018-06-16 ENCOUNTER — Telehealth: Payer: Self-pay | Admitting: *Deleted

## 2018-06-16 NOTE — Telephone Encounter (Signed)
Copied from [place] [phone]. Topic: Referral - Request >> Jun 16, 2018  7:57 AM Nils Flack wrote: Reason for CRM: husband called - he is asking for referral for [facility]. Wife is walking on toe on one leg and husband would like her to go to PT  [phone]

## 2018-06-16 NOTE — Telephone Encounter (Signed)
Probably needs appt with me first---to be sure she doesn't need to go back to the orthopedist---as opposed to therapy

## 2018-06-16 NOTE — Telephone Encounter (Signed)
Spoke to pt's husband to let him know that Dr Silvio Pate is out of the office today and it would be tomorrow before this was addressed. He was fine with that.

## 2018-06-17 NOTE — Telephone Encounter (Signed)
Spoke to pt's husband. He said he will call and set up an appt.

## 2018-06-24 ENCOUNTER — Ambulatory Visit (INDEPENDENT_AMBULATORY_CARE_PROVIDER_SITE_OTHER): Payer: Medicare Other | Admitting: Internal Medicine

## 2018-06-24 ENCOUNTER — Encounter: Payer: Self-pay | Admitting: Internal Medicine

## 2018-06-24 DIAGNOSIS — M25551 Pain in right hip: Secondary | ICD-10-CM | POA: Insufficient documentation

## 2018-06-24 DIAGNOSIS — R238 Other skin changes: Secondary | ICD-10-CM | POA: Diagnosis not present

## 2018-06-24 DIAGNOSIS — R233 Spontaneous ecchymoses: Secondary | ICD-10-CM | POA: Insufficient documentation

## 2018-06-24 NOTE — Progress Notes (Signed)
Subjective:    Patient ID: Mallory Rodriguez, female    DOB: 1933/01/28, 84 y.o.   MRN: [id number]  HPI Here with husband  Having trouble with her right foot Daughter insisted that she come here Not walking well since hip fracture   Has been bruising easily Stopped the clopidogrel and lovastatin Still some bruising Had been on ASA when she had the stroke 2 years ago No oral bleeding, hematuria, blood in stool  Current Outpatient Medications on File Prior to Visit  Medication Sig Dispense Refill  . acetaminophen (TYLENOL) 650 MG CR tablet Take 650 mg by mouth 3 (three) times daily.    Marland Kitchen aspirin EC 81 MG tablet Take 81 mg by mouth daily.    Marland Kitchen CALCIUM-VITAMIN D-VITAMIN K PO Take 1 each by mouth daily.    . cyanocobalamin (,VITAMIN B-12,) 1000 MCG/ML injection Inject 1,000 mcg into the muscle every 3 (three) months.    . EPINEPHrine (EPIPEN JR) 0.15 MG/0.3ML injection INJECT ONE SYRINGEFUL INTRAMUSCULARLY AS NEEDED FOR  ANAPHYLAXIS 2 each 0  . hydrochlorothiazide (HYDRODIURIL) 25 MG tablet TAKE 1 TABLET BY MOUTH EVERY DAY 90 tablet 3  . nitroGLYCERIN (NITROLINGUAL) 0.4 MG/SPRAY spray PLACE ONE SPRAY UNDER THE TONGUE EVERY FIVE MINUTES AS NEEDED FOR CHEST PAIN 4.9 g 1  . Omega-3 Fatty Acids (FISH OIL) 1200 MG CAPS Take 1 capsule by mouth daily.    Vladimir Faster Glycol-Propyl Glycol (SYSTANE OP) Apply 1 drop to eye as needed (for dry eyes).    . polyethylene glycol (MIRALAX / GLYCOLAX) packet Take 17 g by mouth daily as needed for mild constipation. 14 each 0  . Probiotic Product (ALIGN) 4 MG CAPS Take 1 capsule by mouth daily.    . clopidogrel (PLAVIX) 75 MG tablet Take 1 tablet (75 mg total) by mouth daily. (Patient not taking: Reported on 06/24/2018) 90 tablet 3  . lovastatin (MEVACOR) 40 MG tablet TAKE 1 TABLET (40 MG TOTAL) BY MOUTH AT BEDTIME. (Patient not taking: Reported on 06/24/2018) 90 tablet 3   No current facility-administered medications on file prior to visit.     Allergies    Allergen Reactions  . Bee Venom Anaphylaxis  . Contrast Media [Iodinated Diagnostic Agents] Hives  . Lipitor [Atorvastatin] Swelling  . Ace Inhibitors Swelling  . Hydrocodone Other (See Comments)    Reaction:  Unknown   . Macrodantin Other (See Comments)    Reaction:  Chest pain   . Neosporin [Neomycin-Bacitracin Zn-Polymyx] Other (See Comments)    Reaction:  Burning   . Sulfa Antibiotics Hives  . Epinephrine Palpitations  . Prednisone Rash and Other (See Comments)    Reaction:  Dizziness and headache     Past Medical History:  Diagnosis Date  . AP (angina pectoris) ([place])   . Claustrophobia   . Diverticulosis of colon   . Hiatal hernia   . HLD (hyperlipidemia)   . HTN (hypertension)   . Hypogammaglobulinemia ([place])    borderline  . MVP (mitral valve prolapse)    and tricuspid leak  . OA (osteoarthritis)   . Pelvic kidney    lower left side  . Pernicious anemia   . Sciatica   . TIA (transient ischemic attack) 8/16    Past Surgical History:  Procedure Laterality Date  . APPENDECTOMY    . BASAL CELL CARCINOMA EXCISION     Back  . BREAST BIOPSY Right    neg  . BREAST LUMPECTOMY     Right (fatty tumor)  . CATARACT  EXTRACTION W/ INTRAOCULAR LENS  IMPLANT, BILATERAL Bilateral   . CHOLECYSTECTOMY OPEN    . DILATION AND CURETTAGE OF UTERUS    . excision vestibular glands    . Film removed Left eye    . INGUINAL HERNIA REPAIR     Left side  . INTRAMEDULLARY (IM) NAIL INTERTROCHANTERIC Right 01/14/2017   Procedure: ORIF RIGHT INTERTROCH NAIL;  Surgeon: Paralee Cancel, MD;  Location: [facility];  Service: Orthopedics;  Laterality: Right;  . KNEE ARTHROSCOPY W/ PARTIAL MEDIAL MENISCECTOMY  08/2009   right  . Right ovary and tube corpus    . TONSILLECTOMY    . TOTAL ABDOMINAL HYSTERECTOMY      Family History  Problem Relation Age of Onset  . Heart attack Father 76       CABG  . Hypertension Father   . Dementia Mother   . Cancer Maternal Aunt        Breast-multiple aunts  and Gm  . Cancer Maternal Uncle        Bladder and lung  . Diabetes Paternal Grandmother   . Hypertension Brother     Social History   Socioeconomic History  . Marital status: Married    Spouse name: Not on file  . Number of children: 2  . Years of education: Not on file  . Highest education level: Not on file  Occupational History  . Occupation: Retired-bookkeeping/secretarial work    Comment: retired  . Occupation:    Social Needs  . Financial resource strain: Not on file  . Food insecurity:    Worry: Not on file    Inability: Not on file  . Transportation needs:    Medical: Not on file    Non-medical: Not on file  Tobacco Use  . Smoking status: Never Smoker  . Smokeless tobacco: Never Used  Substance and Sexual Activity  . Alcohol use: Yes    Alcohol/week: 1.8 oz    Types: 3 Glasses of wine per week  . Drug use: No  . Sexual activity: Not on file  Lifestyle  . Physical activity:    Days per week: Not on file    Minutes per session: Not on file  . Stress: Not on file  Relationships  . Social connections:    Talks on phone: Not on file    Gets together: Not on file    Attends religious service: Not on file    Active member of club or organization: Not on file    Attends meetings of clubs or organizations: Not on file    Relationship status: Not on file  . Intimate partner violence:    Fear of current or ex partner: Not on file    Emotionally abused: Not on file    Physically abused: Not on file    Forced sexual activity: Not on file  Other Topics Concern  . Not on file  Social History Narrative   Has living will and advance directives.    Requests husband, then daughter to make health care decisions.   Would accept resuscitation but no life prolonging technology if cognitively unaware   Review of Systems No fever Appetite is okay    Objective:   Physical Exam  Constitutional: She appears well-developed. No distress.  Musculoskeletal:  Almost no  external rotation of right hip  Neurological:  Mild antalgic gait with toe walking on right   Skin:  Scattered mild ecchymotic areas  Assessment & Plan:

## 2018-06-24 NOTE — Assessment & Plan Note (Signed)
Something seems to be wrong with her repair Will have her go back to the surgeon

## 2018-06-24 NOTE — Patient Instructions (Signed)
Please set up an appointment with Dr Alvan Dame to check on your right hip.

## 2018-06-24 NOTE — Assessment & Plan Note (Signed)
Asked her to stay on ASA, clopidogrel and the statin despite the mild bruising

## 2018-07-01 DIAGNOSIS — M25571 Pain in right ankle and joints of right foot: Secondary | ICD-10-CM | POA: Diagnosis not present

## 2018-07-01 DIAGNOSIS — M25551 Pain in right hip: Secondary | ICD-10-CM | POA: Diagnosis not present

## 2018-07-14 DIAGNOSIS — M25551 Pain in right hip: Secondary | ICD-10-CM | POA: Diagnosis not present

## 2018-07-17 ENCOUNTER — Other Ambulatory Visit: Payer: Self-pay | Admitting: Internal Medicine

## 2018-07-17 DIAGNOSIS — M25551 Pain in right hip: Secondary | ICD-10-CM | POA: Diagnosis not present

## 2018-07-23 DIAGNOSIS — M25551 Pain in right hip: Secondary | ICD-10-CM | POA: Diagnosis not present

## 2018-07-25 DIAGNOSIS — M25551 Pain in right hip: Secondary | ICD-10-CM | POA: Diagnosis not present

## 2018-07-29 DIAGNOSIS — M25551 Pain in right hip: Secondary | ICD-10-CM | POA: Diagnosis not present

## 2018-08-01 DIAGNOSIS — M25551 Pain in right hip: Secondary | ICD-10-CM | POA: Diagnosis not present

## 2018-08-08 DIAGNOSIS — M25551 Pain in right hip: Secondary | ICD-10-CM | POA: Diagnosis not present

## 2018-08-11 DIAGNOSIS — M25551 Pain in right hip: Secondary | ICD-10-CM | POA: Diagnosis not present

## 2018-08-14 DIAGNOSIS — M25551 Pain in right hip: Secondary | ICD-10-CM | POA: Diagnosis not present

## 2018-08-18 DIAGNOSIS — M25551 Pain in right hip: Secondary | ICD-10-CM | POA: Diagnosis not present

## 2018-08-21 DIAGNOSIS — M25551 Pain in right hip: Secondary | ICD-10-CM | POA: Diagnosis not present

## 2018-08-25 DIAGNOSIS — M25551 Pain in right hip: Secondary | ICD-10-CM | POA: Diagnosis not present

## 2018-08-27 DIAGNOSIS — M25551 Pain in right hip: Secondary | ICD-10-CM | POA: Diagnosis not present

## 2018-09-02 DIAGNOSIS — M25551 Pain in right hip: Secondary | ICD-10-CM | POA: Diagnosis not present

## 2018-09-04 DIAGNOSIS — M25551 Pain in right hip: Secondary | ICD-10-CM | POA: Diagnosis not present

## 2018-09-08 DIAGNOSIS — Z23 Encounter for immunization: Secondary | ICD-10-CM | POA: Diagnosis not present

## 2018-10-28 NOTE — Telephone Encounter (Signed)
Unfortunately we have not seen her in over 2 years (last office visit was 03/23/16).  We cannot refill any prescriptions further for her at this time. She will need to be seen or try to obtain from her PCP.  Thanks!

## 2018-10-28 NOTE — Telephone Encounter (Signed)
Pt LS 02/2016 told to f/u 12 months. Pt has declined several rescheduled attempts. Please advise and see note below for pt is recovering from Hip surgery as to why she is unable to make appointment at this time.

## 2018-10-28 NOTE — Telephone Encounter (Signed)
°*  STAT* If patient is at the pharmacy, call can be transferred to refill team.   1. Which medications need to be refilled? (please list name of each medication and dose if known) Nitro spray SL q 5 min PRN  2. Which pharmacy/location (including street and city if local pharmacy) is medication to be sent to? [facility]   3. Do they need a 30 day or 90 day supply? 90  Patient last ov in 2017. 3 attempts to schedule appt from recall.  Patient calling today for refill on nitro. She still declines to schedule with Dr. Rockey Situ at this time as she is recovering from hip surgery .  She is aware that since she has not been seen recently an rx may not be able to be sent from [facility] and to also call pcp office for a refill request.

## 2018-11-04 DIAGNOSIS — L820 Inflamed seborrheic keratosis: Secondary | ICD-10-CM | POA: Diagnosis not present

## 2018-11-04 DIAGNOSIS — Z1283 Encounter for screening for malignant neoplasm of skin: Secondary | ICD-10-CM | POA: Diagnosis not present

## 2018-11-04 DIAGNOSIS — L578 Other skin changes due to chronic exposure to nonionizing radiation: Secondary | ICD-10-CM | POA: Diagnosis not present

## 2018-11-04 DIAGNOSIS — D692 Other nonthrombocytopenic purpura: Secondary | ICD-10-CM | POA: Diagnosis not present

## 2018-11-04 DIAGNOSIS — L821 Other seborrheic keratosis: Secondary | ICD-10-CM | POA: Diagnosis not present

## 2018-11-04 DIAGNOSIS — L570 Actinic keratosis: Secondary | ICD-10-CM | POA: Diagnosis not present

## 2018-11-04 DIAGNOSIS — L812 Freckles: Secondary | ICD-10-CM | POA: Diagnosis not present

## 2018-11-04 DIAGNOSIS — D1800 Hemangioma unspecified site: Secondary | ICD-10-CM | POA: Diagnosis not present
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
## 2018-11-11 ENCOUNTER — Ambulatory Visit (INDEPENDENT_AMBULATORY_CARE_PROVIDER_SITE_OTHER): Payer: Medicare Other | Admitting: Cardiovascular Disease

## 2018-11-11 ENCOUNTER — Encounter: Payer: Self-pay | Admitting: Cardiovascular Disease

## 2018-11-11 VITALS — BP 140/64 | HR 84 | Ht 61.0 in | Wt 150.5 lb

## 2018-11-11 DIAGNOSIS — I6300 Cerebral infarction due to thrombosis of unspecified precerebral artery: Secondary | ICD-10-CM | POA: Diagnosis not present

## 2018-11-11 DIAGNOSIS — I059 Rheumatic mitral valve disease, unspecified: Secondary | ICD-10-CM | POA: Diagnosis not present

## 2018-11-11 DIAGNOSIS — I10 Essential (primary) hypertension: Secondary | ICD-10-CM

## 2018-11-11 DIAGNOSIS — E782 Mixed hyperlipidemia: Secondary | ICD-10-CM | POA: Diagnosis not present

## 2018-11-11 DIAGNOSIS — I69959 Hemiplegia and hemiparesis following unspecified cerebrovascular disease affecting unspecified side: Secondary | ICD-10-CM

## 2018-11-11 NOTE — Patient Instructions (Signed)
Medication Instructions:  No changes  Stay on lovastatin for cholesterol  If you need a refill on your cardiac medications before your next appointment, please call your pharmacy.    Lab work: No new labs needed   If you have labs (blood work) drawn today and your tests are completely normal, you will receive your results only by: Marland Kitchen MyChart Message (if you have MyChart) OR . A paper copy in the mail If you have any lab test that is abnormal or we need to change your treatment, we will call you to review the results.   Testing/Procedures: No new testing needed   Follow-Up: At [facility], you and your health needs are our priority.  As part of our continuing mission to provide you with exceptional heart care, we have created designated Provider Care Teams.  These Care Teams include your primary Cardiologist (physician) and Advanced Practice Providers (APPs -  Physician Assistants and Nurse Practitioners) who all work together to provide you with the care you need, when you need it.  . You will need a follow up appointment as needed   . Providers on your designated Care Team:   . Murray Hodgkins, NP . Christell Faith, PA-C . Marrianne Mood, PA-C  Any Other Special Instructions Will Be Listed Below (If Applicable).  For educational health videos Log in to : [email or web address] Or : [email or web address], password : [email or web address]

## 2018-11-18 ENCOUNTER — Ambulatory Visit (INDEPENDENT_AMBULATORY_CARE_PROVIDER_SITE_OTHER): Payer: Medicare Other | Admitting: Family Medicine

## 2018-11-18 ENCOUNTER — Encounter: Payer: Self-pay | Admitting: Family Medicine

## 2018-11-18 VITALS — BP 144/54 | HR 72 | Temp 98.6°F | Resp 14 | Ht 61.0 in | Wt 151.2 lb

## 2018-11-18 DIAGNOSIS — J0110 Acute frontal sinusitis, unspecified: Secondary | ICD-10-CM | POA: Diagnosis not present

## 2018-11-18 MED ORDER — AMOXICILLIN-POT CLAVULANATE 875-125 MG PO TABS: 1.0000 | ORAL_TABLET | Freq: Two times a day (BID) | ORAL | 0 refills | Status: AC

## 2018-11-18 NOTE — Patient Instructions (Addendum)
Based on your symptoms, it looks like you have a virus.   Antibiotics are not need for a viral infection but the following will help:   1. Drink plenty of fluids 2. Get lots of rest  Sinus Congestion 1) Neti Pot (Saline rinse) -- 2 times day -- if tolerated 2) Flonase (Store Brand ok) - once daily 3) Over the counter congestion medications  Cough 1) Cough drops can be helpful 2) Nyquil (or nighttime cough medication) 3) Honey is proven to be one of the best cough medications    If you develop fevers (Temperature >100.4), chills, worsening symptoms or symptoms lasting longer than 10 days return to clinic.    - If not improved - start antibiotic - if you start and finish the antibiotic and do not improve after 5 days, return to clinic

## 2018-11-18 NOTE — Progress Notes (Signed)
Subjective:     Mallory Rodriguez is a 84 y.o. female presenting for Cough (Cold symptoms x 7 to 8 days. Not sure if she has had a fever. Has been taking Tylenol but no other OTC medications for her symptoms )     URI   This is a new problem. The current episode started in the past 7 days. The problem has been gradually improving. There has been no fever. Associated symptoms include congestion, coughing (worse at night), ear pain, headaches, rhinorrhea, sinus pain and sneezing. Pertinent negatives include no abdominal pain, chest pain, diarrhea, dysuria, joint pain, nausea, neck pain, plugged ear sensation, rash, sore throat, swollen glands, vomiting or wheezing. She has tried acetaminophen and sleep for the symptoms. The treatment provided mild relief.   Does not have a lot in house - living with daughter Sick contact - no Reports that she has not done well with nasal - declines neti pot Denies dental pain   Review of Systems  HENT: Positive for congestion, ear pain, rhinorrhea, sinus pain and sneezing. Negative for sore throat.   Respiratory: Positive for cough (worse at night). Negative for wheezing.   Cardiovascular: Negative for chest pain.  Gastrointestinal: Negative for abdominal pain, diarrhea, nausea and vomiting.  Genitourinary: Negative for dysuria.  Musculoskeletal: Negative for joint pain and neck pain.  Skin: Negative for rash.  Neurological: Positive for headaches.     Social History   Tobacco Use  Smoking Status Never Smoker  Smokeless Tobacco Never Used        Objective:    BP Readings from Last 3 Encounters:  11/18/18 (!) 144/54  11/11/18 140/64  06/24/18 130/78   Wt Readings from Last 3 Encounters:  11/18/18 151 lb 4 oz (68.6 kg)  11/11/18 150 lb 8 oz (68.3 kg)  06/24/18 146 lb (66.2 kg)    BP (!) 144/54   Pulse 72   Temp 98.6 F (37 C)   Resp 14   Ht 5\' 1"  (1.549 m)   Wt 151 lb 4 oz (68.6 kg)   SpO2 95%   BMI 28.58 kg/m    Physical Exam   Constitutional: She appears well-developed and well-nourished. No distress.  HENT:  Head: Normocephalic and atraumatic.  Right Ear: Tympanic membrane and ear canal normal.  Left Ear: Tympanic membrane and ear canal normal.  Nose: Mucosal edema and rhinorrhea present. Right sinus exhibits no maxillary sinus tenderness and no frontal sinus tenderness. Left sinus exhibits frontal sinus tenderness. Left sinus exhibits no maxillary sinus tenderness.  Mouth/Throat: Uvula is midline and mucous membranes are normal. Posterior oropharyngeal erythema present. No oropharyngeal exudate. Tonsils are 0 on the right. Tonsils are 0 on the left.  Eyes: Conjunctivae and EOM are normal. No scleral icterus.  Neck: Neck supple.  Cardiovascular: Normal rate, regular rhythm and normal heart sounds.  No murmur heard. Pulmonary/Chest: Effort normal and breath sounds normal. No respiratory distress.  Lymphadenopathy:    She has no cervical adenopathy.  Neurological: She is alert.  Skin: Skin is warm and dry. Capillary refill takes less than 2 seconds. She is not diaphoretic.  Psychiatric: She has a normal mood and affect.          Assessment & Plan:   Problem List Items Addressed This Visit    None    Visit Diagnoses    Acute frontal sinusitis, recurrence not specified    -  Primary   Relevant Medications   amoxicillin-clavulanate (AUGMENTIN) 875-125 MG tablet  Give day 7 of symptoms discussed symptomatic care but to start Abx if not improved in 1-3 days .  Return if symptoms worsen or fail to improve.  Lesleigh Noe, MD

## 2018-12-23 DIAGNOSIS — N390 Urinary tract infection, site not specified: Secondary | ICD-10-CM | POA: Diagnosis not present

## 2019-01-28 ENCOUNTER — Other Ambulatory Visit: Payer: Self-pay | Admitting: Internal Medicine

## 2019-03-01 ENCOUNTER — Other Ambulatory Visit: Payer: Self-pay | Admitting: Internal Medicine

## 2019-03-02 NOTE — Telephone Encounter (Signed)
See refill request and response from patient Last office visit 11/18/18 Last lipid 01/10/18 Last CPE 01/10/18 Patient stated that she does not take Lovastatin anymore.

## 2019-03-02 NOTE — Telephone Encounter (Signed)
I spoke to patient and she said she doesn't take Lovastatin anymore and she didn't want to schedule a physical.

## 2019-03-02 NOTE — Telephone Encounter (Signed)
Please call patient to schedule annual physical. Send back after appointment is scheduled.

## 2019-03-10 ENCOUNTER — Other Ambulatory Visit: Payer: Self-pay | Admitting: Internal Medicine

## 2019-05-21 ENCOUNTER — Ambulatory Visit (INDEPENDENT_AMBULATORY_CARE_PROVIDER_SITE_OTHER): Payer: Medicare Other | Admitting: Family Medicine

## 2019-05-21 ENCOUNTER — Other Ambulatory Visit: Payer: Medicare Other

## 2019-05-21 ENCOUNTER — Telehealth: Payer: Self-pay | Admitting: Internal Medicine

## 2019-05-21 ENCOUNTER — Encounter: Payer: Self-pay | Admitting: Family Medicine

## 2019-05-21 VITALS — BP 185/78 | HR 67 | Temp 97.8°F | Ht 61.0 in | Wt 156.5 lb

## 2019-05-21 DIAGNOSIS — R300 Dysuria: Secondary | ICD-10-CM | POA: Diagnosis not present

## 2019-05-21 DIAGNOSIS — N3090 Cystitis, unspecified without hematuria: Secondary | ICD-10-CM | POA: Diagnosis not present

## 2019-05-21 DIAGNOSIS — Z881 Allergy status to other antibiotic agents status: Secondary | ICD-10-CM | POA: Diagnosis not present

## 2019-05-21 LAB — POC URINALSYSI DIPSTICK (AUTOMATED)
Bilirubin, UA: NEGATIVE
Glucose, UA: NEGATIVE
Ketones, UA: NEGATIVE
Nitrite, UA: NEGATIVE
Protein, UA: NEGATIVE
Spec Grav, UA: 1.02 (ref 1.010–1.025)
Urobilinogen, UA: 0.2 E.U./dL
pH, UA: 6 (ref 5.0–8.0)

## 2019-05-21 MED ORDER — AMOXICILLIN-POT CLAVULANATE 500-125 MG PO TABS: 1.0000 | ORAL_TABLET | Freq: Two times a day (BID) | ORAL | 0 refills | Status: AC

## 2019-05-21 NOTE — Telephone Encounter (Signed)
Pt scheduled virtual visit today at 2:20; see appt notes.

## 2019-05-21 NOTE — Telephone Encounter (Signed)
Patient's Daughter Asha Grumbine believes her mother may have a UTI.   PHONE- [phone]

## 2019-05-21 NOTE — Progress Notes (Signed)
Berlie Persky T. Aristides Luckey, MD Primary Care and [place] at [facility] [place], [address] Phone: [phone]  FAX: [phone]  Mallory Rodriguez - 85 y.o. female  MRN [id number]  Date of Birth: 1933/07/11  Visit Date: 05/21/2019  PCP: Venia Carbon, MD  Referred by: Venia Carbon, MD Chief Complaint  Patient presents with  . Dysuria  . Urinary Frequency   Virtual Visit via Video Note:  I connected with  Mallory Rodriguez on 05/21/2019  2:20 PM EDT by a video enabled telemedicine application and verified that I am speaking with the correct person using two identifiers.   Location patient: home computer, tablet, or smartphone Location provider: work or home office Consent: Verbal consent directly obtained from [employer]. Persons participating in the virtual visit: patient, provider  I discussed the limitations of evaluation and management by telemedicine and the availability of in person appointments. The patient expressed understanding and agreed to proceed.  History of Present Illness:  10 d cystitis.  She has been having urgency and pain with urination for approximately 10 days.  She is also having some suprapubic pain and not having any CVA tenderness or pain.  She is eating and drinking normally.  She is does not have any form of fever.  She is not having any diarrhea or other systemic symptoms or respiratory symptoms.  Review of Systems as above: See pertinent positives and pertinent negatives per HPI No acute distress verbally  Past Medical History, Surgical History, Social History, Family History, Problem List, Medications, and Allergies have been reviewed and updated if relevant.   Observations/Objective/Exam:  An attempt was made to discern vital signs over the phone and per patient if applicable and possible.   General:    Alert, Oriented, appears well and in no acute distress HEENT:     Atraumatic,  conjunctiva clear, no obvious abnormalities on inspection of external nose and ears.  Neck:    Normal movements of the head and neck Pulmonary:     On inspection no signs of respiratory distress, breathing rate appears normal, no obvious gross SOB, gasping or wheezing Cardiovascular:    No obvious cyanosis Musculoskeletal:    Moves all visible extremities without noticeable abnormality Psych / Neurological:     Pleasant and cooperative, no obvious depression or anxiety, speech and thought processing grossly intact  Results for orders placed or performed in visit on 05/21/19  POCT Urinalysis Dipstick (Automated)  Result Value Ref Range   Color, UA Yellow    Clarity, UA Hazy    Glucose, UA Negative Negative   Bilirubin, UA Negative    Ketones, UA Negative    Spec Grav, UA 1.020 1.010 - 1.025   Blood, UA Trace    pH, UA 6.0 5.0 - 8.0   Protein, UA Negative Negative   Urobilinogen, UA 0.2 0.2 or 1.0 E.U./dL   Nitrite, UA Negative    Leukocytes, UA Moderate (2+) (A) Negative     Assessment and Plan:  Cystitis  Dysuria - Plan: Urine Culture, POCT Urinalysis Dipstick (Automated)  Allergy to multiple antibiotics  With her allergy profile and patient preference, placed her on Augmentin.  Her urine culture is pending, and we may need to adjust antibiotics based on sensitivities.  I discussed the assessment and treatment plan with the patient. The patient was provided an opportunity to ask questions and all were answered. The patient agreed with the plan and demonstrated an understanding  of the instructions.   The patient was advised to call back or seek an in-person evaluation if the symptoms worsen or if the condition fails to improve as anticipated.  Follow-up: prn unless noted otherwise below No follow-ups on file.  Meds ordered this encounter  Medications  . amoxicillin-clavulanate (AUGMENTIN) 500-125 MG tablet    Sig: Take 1 tablet (500 mg total) by mouth 2 (two) times  daily for 3 days.    Dispense:  6 tablet    Refill:  0   Orders Placed This Encounter  Procedures  . Urine Culture  . POCT Urinalysis Dipstick (Automated)    Signed,  Hannahmarie Asberry T. Porcia Morganti, MD

## 2019-05-22 ENCOUNTER — Encounter: Payer: Self-pay | Admitting: Family Medicine

## 2019-05-23 LAB — URINE CULTURE
MICRO NUMBER:: 496207
SPECIMEN QUALITY:: ADEQUATE

## 2019-06-03 ENCOUNTER — Other Ambulatory Visit: Payer: Self-pay | Admitting: Internal Medicine

## 2019-06-09 ENCOUNTER — Ambulatory Visit (INDEPENDENT_AMBULATORY_CARE_PROVIDER_SITE_OTHER): Payer: Medicare Other | Admitting: Family Medicine

## 2019-06-09 ENCOUNTER — Other Ambulatory Visit: Payer: Medicare Other

## 2019-06-09 VITALS — Temp 98.4°F | Wt 156.0 lb

## 2019-06-09 DIAGNOSIS — R300 Dysuria: Secondary | ICD-10-CM | POA: Diagnosis not present

## 2019-06-09 DIAGNOSIS — N3000 Acute cystitis without hematuria: Secondary | ICD-10-CM | POA: Diagnosis not present

## 2019-06-09 LAB — POCT URINALYSIS DIPSTICK
Bilirubin, UA: NEGATIVE
Blood, UA: POSITIVE
Glucose, UA: NEGATIVE
Ketones, UA: NEGATIVE
Nitrite, UA: NEGATIVE
Protein, UA: POSITIVE — AB
Spec Grav, UA: 1.02 (ref 1.010–1.025)
Urobilinogen, UA: 0.2 E.U./dL
pH, UA: 6 (ref 5.0–8.0)

## 2019-06-09 MED ORDER — AMOXICILLIN-POT CLAVULANATE 875-125 MG PO TABS: 1.0000 | ORAL_TABLET | Freq: Two times a day (BID) | ORAL | 0 refills | Status: AC

## 2019-06-09 NOTE — Progress Notes (Signed)
I connected with Norberto Sorenson on 06/09/19 at  3:20 PM EDT by video and verified that I am speaking with the correct person using two identifiers.   I discussed the limitations, risks, security and privacy concerns of performing an evaluation and management service by video and the availability of in person appointments. I also discussed with the patient that there may be a patient responsible charge related to this service. The patient expressed understanding and agreed to proceed.  Patient location: Home Provider Location: [facility] Participants: Lesleigh Noe and Norberto Sorenson and Daughter   Subjective:     Mallory Rodriguez is a 85 y.o. female presenting for Urinary Urgency (pain and burning with urination, some urinary incontinence. Overall symptoms have been present for about 1 week. Symptosm never resolved 100% after taking abx last time.)     HPI   #Urinary urgency - also have dysuria - some urinary incontinence - symptoms present x 1 week - never fully resolved after last UTI - only did a 3 day course - did have some improvement initially - since then has had symptoms which are intermittent but now more constant - no hx of incontinence   Review of Systems  Constitutional: Negative for chills and fever.  Gastrointestinal: Negative for nausea and vomiting.     05/21/2019: Clinic - cystitis started on augmentin, culture with Klebsiella which is sensitive to augmentin.   Social History   Tobacco Use  Smoking Status Never Smoker  Smokeless Tobacco Never Used        Objective:   BP Readings from Last 3 Encounters:  05/21/19 (!) 185/78  11/18/18 (!) 144/54  11/11/18 140/64   Wt Readings from Last 3 Encounters:  06/09/19 156 lb (70.8 kg)  05/21/19 156 lb 8 oz (71 kg)  11/18/18 151 lb 4 oz (68.6 kg)   Temp 98.4 F (36.9 C) Comment: per patient  Wt 156 lb (70.8 kg) Comment: per patient  BMI 29.48 kg/m    Physical Exam Constitutional:    Appearance: Normal appearance. She is not ill-appearing.  HENT:     Head: Normocephalic and atraumatic.     Right Ear: External ear normal.     Left Ear: External ear normal.  Eyes:     Conjunctiva/sclera: Conjunctivae normal.  Pulmonary:     Effort: Pulmonary effort is normal. No respiratory distress.  Neurological:     Mental Status: She is alert. Mental status is at baseline.  Psychiatric:        Mood and Affect: Mood normal.        Behavior: Behavior normal.        Thought Content: Thought content normal.        Judgment: Judgment normal.      UA: + LE, neg nitrites, + protein       Assessment & Plan:   Problem List Items Addressed This Visit    None    Visit Diagnoses    Acute cystitis without hematuria    -  Primary   Relevant Medications   amoxicillin-clavulanate (AUGMENTIN) 875-125 MG tablet   Dysuria       Relevant Medications   amoxicillin-clavulanate (AUGMENTIN) 875-125 MG tablet   Other Relevant Orders   POCT urinalysis dipstick (Completed)   Urine Culture     Urine with signs of infection. Initial improvement on 3 days of abx in the past. Will start with 7 day course and f/u culture sensitivities. May need to change abx. Daughter  requests results be called to her.   Return if symptoms worsen or fail to improve.  Lesleigh Noe, MD

## 2019-06-12 LAB — URINE CULTURE
MICRO NUMBER:: 551341
SPECIMEN QUALITY:: ADEQUATE

## 2019-08-03 ENCOUNTER — Other Ambulatory Visit: Payer: Self-pay

## 2019-08-25 ENCOUNTER — Telehealth: Payer: Self-pay | Admitting: Internal Medicine

## 2019-08-25 NOTE — Telephone Encounter (Signed)
I left a message on patient's voice mail her medications were refilled for 90 days and to call back and schedule AWV in the next 3 months.

## 2019-08-25 NOTE — Telephone Encounter (Signed)
I refilled her medications for 90 days. Can you please get her set up with a [facility] in the next 3 months if possible? Thanks!

## 2019-09-05 DIAGNOSIS — Z23 Encounter for immunization: Secondary | ICD-10-CM | POA: Diagnosis not present

## 2019-10-16 ENCOUNTER — Other Ambulatory Visit: Payer: Self-pay | Admitting: Internal Medicine

## 2020-01-21 ENCOUNTER — Ambulatory Visit (INDEPENDENT_AMBULATORY_CARE_PROVIDER_SITE_OTHER)
Admission: RE | Admit: 2020-01-21 | Discharge: 2020-01-21 | Disposition: A | Discharge status: Home / Self Care | Payer: Medicare Other | Source: Ambulatory Visit | Attending: Internal Medicine | Admitting: Internal Medicine

## 2020-01-21 ENCOUNTER — Ambulatory Visit (INDEPENDENT_AMBULATORY_CARE_PROVIDER_SITE_OTHER): Payer: Medicare Other | Admitting: Internal Medicine

## 2020-01-21 ENCOUNTER — Encounter: Payer: Self-pay | Admitting: Internal Medicine

## 2020-01-21 ENCOUNTER — Other Ambulatory Visit: Payer: Self-pay

## 2020-01-21 VITALS — BP 136/70 | Temp 97.9°F | Ht 61.0 in | Wt 162.0 lb

## 2020-01-21 DIAGNOSIS — M545 Low back pain, unspecified: Secondary | ICD-10-CM

## 2020-01-21 DIAGNOSIS — S3992XA Unspecified injury of lower back, initial encounter: Secondary | ICD-10-CM | POA: Diagnosis not present

## 2020-01-21 DIAGNOSIS — I69959 Hemiplegia and hemiparesis following unspecified cerebrovascular disease affecting unspecified side: Secondary | ICD-10-CM

## 2020-01-21 DIAGNOSIS — S79911A Unspecified injury of right hip, initial encounter: Secondary | ICD-10-CM | POA: Diagnosis not present

## 2020-01-21 DIAGNOSIS — M25551 Pain in right hip: Secondary | ICD-10-CM

## 2020-01-21 DIAGNOSIS — S72001A Fracture of unspecified part of neck of right femur, initial encounter for closed fracture: Secondary | ICD-10-CM | POA: Diagnosis not present

## 2020-01-21 MED ORDER — HYDROCODONE-ACETAMINOPHEN 5-325 MG PO TABS: 0.5000 | ORAL_TABLET | Freq: Four times a day (QID) | ORAL | 0 refills | Status: DC | PRN

## 2020-01-21 NOTE — Progress Notes (Signed)
Subjective:    Patient ID: Mallory Rodriguez, female    DOB: [birth year], 86 y.o.   MRN: [id number]  HPI Here due to right hip pain  This visit occurred during the SARS-CoV-2 public health emergency.  Safety protocols were in place, including screening questions prior to the visit, additional usage of staff PPE, and extensive cleaning of exam room while observing appropriate contact time as indicated for disinfecting solutions.   Bent down to pick up spoons--"and I don't bend well" A few days ago Immediately felt pain posterior part of hip Not able to walk well since the hip fracture --but now harder Walks about the same though  Pain most of the time Especially bothering her at night Tylenol didn't help  Current Outpatient Medications on File Prior to Visit  Medication Sig Dispense Refill  . acetaminophen (TYLENOL) 650 MG CR tablet Take 650 mg by mouth 3 (three) times daily.    Marland Kitchen aspirin EC 81 MG tablet Take 81 mg by mouth daily.    Marland Kitchen CALCIUM-VITAMIN D-VITAMIN K PO Take 1 each by mouth daily.    . clopidogrel (PLAVIX) 75 MG tablet TAKE 1 TABLET BY MOUTH EVERY DAY 90 tablet 0  . cyanocobalamin (,VITAMIN B-12,) 1000 MCG/ML injection Inject 1,000 mcg into the muscle every 3 (three) months.    . EPINEPHrine (EPIPEN JR) 0.15 MG/0.3ML injection INJECT ONE SYRINGEFUL INTRAMUSCULARLY AS NEEDED FOR ANAPHYLAXIS 2 each 0  . hydrochlorothiazide (HYDRODIURIL) 25 MG tablet TAKE 1 TABLET (25 MG TOTAL) BY MOUTH DAILY. NEEDS OFFICE VISIT 90 tablet 0  . Omega-3 Fatty Acids (FISH OIL) 1200 MG CAPS Take 1 capsule by mouth daily.    Vladimir Faster Glycol-Propyl Glycol (SYSTANE OP) Apply 1 drop to eye as needed (for dry eyes).    . polyethylene glycol (MIRALAX / GLYCOLAX) packet Take 17 g by mouth daily as needed for mild constipation. 14 each 0  . Probiotic Product (ALIGN) 4 MG CAPS Take 1 capsule by mouth daily.     No current facility-administered medications on file prior to visit.    Allergies  Allergen  Reactions  . Bee Venom Anaphylaxis  . Contrast Media [Iodinated Diagnostic Agents] Hives  . Lipitor [Atorvastatin] Swelling  . Ace Inhibitors Swelling  . Hydrocodone Other (See Comments)    Reaction:  Unknown   . Macrodantin Other (See Comments)    Reaction:  Chest pain   . Neosporin [Neomycin-Bacitracin Zn-Polymyx] Other (See Comments)    Reaction:  Burning   . Sulfa Antibiotics Hives  . Epinephrine Palpitations  . Prednisone Rash and Other (See Comments)    Reaction:  Dizziness and headache     Past Medical History:  Diagnosis Date  . AP (angina pectoris) ([place])   . Claustrophobia   . Diverticulosis of colon   . Hiatal hernia   . HLD (hyperlipidemia)   . HTN (hypertension)   . Hypogammaglobulinemia ([place])    borderline  . MVP (mitral valve prolapse)    and tricuspid leak  . OA (osteoarthritis)   . Pelvic kidney    lower left side  . Pernicious anemia   . Sciatica   . TIA (transient ischemic attack) 8/16    Past Surgical History:  Procedure Laterality Date  . APPENDECTOMY    . BASAL CELL CARCINOMA EXCISION     Back  . BREAST BIOPSY Right    neg  . BREAST LUMPECTOMY     Right (fatty tumor)  . CATARACT EXTRACTION W/ INTRAOCULAR LENS  IMPLANT, BILATERAL  Bilateral   . CHOLECYSTECTOMY OPEN    . DILATION AND CURETTAGE OF UTERUS    . excision vestibular glands    . Film removed Left eye    . INGUINAL HERNIA REPAIR     Left side  . INTRAMEDULLARY (IM) NAIL INTERTROCHANTERIC Right 01/14/2017   Procedure: ORIF RIGHT INTERTROCH NAIL;  Surgeon: Paralee Cancel, MD;  Location: [facility];  Service: Orthopedics;  Laterality: Right;  . KNEE ARTHROSCOPY W/ PARTIAL MEDIAL MENISCECTOMY  08/2009   right  . partial hip surgery    . Right ovary and tube corpus    . TONSILLECTOMY    . TOTAL ABDOMINAL HYSTERECTOMY      Family History  Problem Relation Age of Onset  . Heart attack Father 70       CABG  . Hypertension Father   . Dementia Mother   . Cancer Maternal Aunt         Breast-multiple aunts and Gm  . Cancer Maternal Uncle        Bladder and lung  . Diabetes Paternal Grandmother   . Hypertension Brother     Social History   Socioeconomic History  . Marital status: Married    Spouse name: Not on file  . Number of children: 2  . Years of education: Not on file  . Highest education level: Not on file  Occupational History  . Occupation: Retired-bookkeeping/secretarial work    Comment: retired  . Occupation:    Tobacco Use  . Smoking status: Never Smoker  . Smokeless tobacco: Never Used  Substance and Sexual Activity  . Alcohol use: Yes    Alcohol/week: 3.0 standard drinks    Types: 3 Glasses of wine per week  . Drug use: No  . Sexual activity: Not on file  Other Topics Concern  . Not on file  Social History Narrative   Has living will and advance directives.    Requests husband, then daughter to make health care decisions.   Would accept resuscitation but no life prolonging technology if cognitively unaware   Social Determinants of Health   Financial Resource Strain:   . Difficulty of Paying Living Expenses: Not on file  Food Insecurity:   . Worried About Charity fundraiser in the Last Year: Not on file  . Ran Out of Food in the Last Year: Not on file  Transportation Needs:   . Lack of Transportation (Medical): Not on file  . Lack of Transportation (Non-Medical): Not on file  Physical Activity:   . Days of Exercise per Week: Not on file  . Minutes of Exercise per Session: Not on file  Stress:   . Feeling of Stress : Not on file  Social Connections:   . Frequency of Communication with Friends and Family: Not on file  . Frequency of Social Gatherings with Friends and Family: Not on file  . Attends Religious Services: Not on file  . Active Member of Clubs or Organizations: Not on file  . Attends Archivist Meetings: Not on file  . Marital Status: Not on file  Intimate Partner Violence:   . Fear of Current or Ex-Partner:  Not on file  . Emotionally Abused: Not on file  . Physically Abused: Not on file  . Sexually Abused: Not on file   Review of Systems  No new leg weakness     Objective:   Physical Exam  Constitutional: No distress.  Musculoskeletal:     Comments: Tenderness along lumbar  spine Also marked tenderness in posterior right hip Guards against movement and rotation is limited--especially internal  Neurological:  Limps with some right side weakness           Assessment & Plan:

## 2020-01-21 NOTE — Assessment & Plan Note (Signed)
She has chronic right leg problems---unclear if from occult left cerebral stroke seen on CT scan--or just from the hip fracture and repair

## 2020-01-21 NOTE — Assessment & Plan Note (Signed)
Since bending down a few days ago Doesn't report back pain but has point tenderness there---worrisome for compression fracture Will check x-rays

## 2020-01-21 NOTE — Assessment & Plan Note (Addendum)
Since bending down a few days ago Antalgic gait and marked tenderness in posterior hip Will check stat x-ray to check past fracture repair  X-rays of hip and spine are reassuring Will give her some hydrocodone for acute pain relief Consider PT if not better by next week

## 2020-02-03 DIAGNOSIS — Z961 Presence of intraocular lens: Secondary | ICD-10-CM | POA: Diagnosis not present

## 2020-03-05 ENCOUNTER — Other Ambulatory Visit: Payer: Self-pay | Admitting: Internal Medicine

## 2020-03-21 ENCOUNTER — Other Ambulatory Visit: Payer: Self-pay | Admitting: Internal Medicine

## 2020-06-10 ENCOUNTER — Encounter: Payer: Self-pay | Admitting: Internal Medicine

## 2020-06-10 ENCOUNTER — Other Ambulatory Visit: Payer: Self-pay

## 2020-06-10 ENCOUNTER — Ambulatory Visit (INDEPENDENT_AMBULATORY_CARE_PROVIDER_SITE_OTHER): Payer: Medicare Other | Admitting: Internal Medicine

## 2020-06-10 VITALS — BP 110/70 | HR 87 | Temp 97.1°F | Ht 61.0 in | Wt 164.0 lb

## 2020-06-10 DIAGNOSIS — R609 Edema, unspecified: Secondary | ICD-10-CM | POA: Insufficient documentation

## 2020-06-10 DIAGNOSIS — N390 Urinary tract infection, site not specified: Secondary | ICD-10-CM | POA: Diagnosis not present

## 2020-06-10 DIAGNOSIS — R600 Localized edema: Secondary | ICD-10-CM

## 2020-06-10 DIAGNOSIS — R350 Frequency of micturition: Secondary | ICD-10-CM | POA: Diagnosis not present

## 2020-06-10 DIAGNOSIS — I10 Essential (primary) hypertension: Secondary | ICD-10-CM | POA: Diagnosis not present

## 2020-06-10 LAB — LIPID PANEL
Cholesterol: 225 mg/dL — ABNORMAL HIGH (ref 0–200)
HDL: 107.9 mg/dL (ref >39.00)
LDL Cholesterol: 102 mg/dL — ABNORMAL HIGH (ref 0–99)
NonHDL: 117.19
Total CHOL/HDL Ratio: 2
Triglycerides: 77 mg/dL (ref 0.0–149.0)
VLDL: 15.4 mg/dL (ref 0.0–40.0)

## 2020-06-10 LAB — COMPREHENSIVE METABOLIC PANEL
ALT: 14 U/L (ref 0–35)
AST: 21 U/L (ref 0–37)
Albumin: 4.6 g/dL (ref 3.5–5.2)
Alkaline Phosphatase: 48 U/L (ref 39–117)
BUN: 27 mg/dL — ABNORMAL HIGH (ref 6–23)
CO2: 28 mEq/L (ref 19–32)
Calcium: 9.8 mg/dL (ref 8.4–10.5)
Chloride: 99 mEq/L (ref 96–112)
Creatinine, Ser: 0.98 mg/dL (ref 0.40–1.20)
GFR: 54 mL/min — ABNORMAL LOW (ref >60.00)
Glucose, Bld: 95 mg/dL (ref 70–99)
Potassium: 3.7 mEq/L (ref 3.5–5.1)
Sodium: 137 mEq/L (ref 135–145)
Total Bilirubin: 0.6 mg/dL (ref 0.2–1.2)
Total Protein: 7.6 g/dL (ref 6.0–8.3)

## 2020-06-10 LAB — POC URINALSYSI DIPSTICK (AUTOMATED)
Glucose, UA: NEGATIVE
Ketones, UA: NEGATIVE
Nitrite, UA: NEGATIVE
Protein, UA: NEGATIVE
Spec Grav, UA: 1.025 (ref 1.010–1.025)
Urobilinogen, UA: 0.2 E.U./dL
pH, UA: 6 (ref 5.0–8.0)

## 2020-06-10 LAB — CBC
HCT: 39.4 % (ref 36.0–46.0)
Hemoglobin: 13.3 g/dL (ref 12.0–15.0)
MCHC: 33.6 g/dL (ref 30.0–36.0)
MCV: 95.8 fl (ref 78.0–100.0)
Platelets: 221 10*3/uL (ref 150.0–400.0)
RBC: 4.12 Mil/uL (ref 3.87–5.11)
RDW: 14.5 % (ref 11.5–15.5)
WBC: 6.1 10*3/uL (ref 4.0–10.5)

## 2020-06-10 LAB — T4, FREE: Free T4: 0.59 ng/dL — ABNORMAL LOW (ref 0.60–1.60)

## 2020-06-10 MED ORDER — AMOXICILLIN-POT CLAVULANATE 875-125 MG PO TABS
1.0000 | ORAL_TABLET | Freq: Two times a day (BID) | ORAL | 0 refills | Status: DC
Start: 2020-06-10 — End: 2020-10-25

## 2020-06-10 NOTE — Assessment & Plan Note (Signed)
Mostly in right ankle Has had past injury Topical rubs help She prefers no brace or support hose

## 2020-06-10 NOTE — Progress Notes (Signed)
Subjective:    Patient ID: Mallory Rodriguez, female    DOB: 10/13/1933, 86 y.o.   MRN: [id number]  HPI Here due to urinary symptoms This visit occurred during the SARS-CoV-2 public health emergency.  Safety protocols were in place, including screening questions prior to the visit, additional usage of staff PPE, and extensive cleaning of exam room while observing appropriate contact time as indicated for disinfecting solutions.   Has some sharp pains in suprapubic area Will have urgency and pain when going Shakes some when voiding No fever Taking the azo tid but not helping much (slight decrease in pain)  Current Outpatient Medications on File Prior to Visit  Medication Sig Dispense Refill  . acetaminophen (TYLENOL) 650 MG CR tablet Take 650 mg by mouth 3 (three) times daily.    Marland Kitchen aspirin EC 81 MG tablet Take 81 mg by mouth daily.    Marland Kitchen CALCIUM-VITAMIN D-VITAMIN K PO Take 1 each by mouth daily.    . clopidogrel (PLAVIX) 75 MG tablet TAKE 1 TABLET BY MOUTH EVERY DAY 90 tablet 1  . cyanocobalamin (,VITAMIN B-12,) 1000 MCG/ML injection Inject 1,000 mcg into the muscle every 3 (three) months.    . EPINEPHrine (EPIPEN JR) 0.15 MG/0.3ML injection INJECT ONE SYRINGEFUL INTRAMUSCULARLY AS NEEDED FOR ANAPHYLAXIS 2 each 0  . hydrochlorothiazide (HYDRODIURIL) 25 MG tablet TAKE 1 TABLET (25 MG TOTAL) BY MOUTH DAILY. NEEDS OFFICE VISIT 90 tablet 1  . HYDROcodone-acetaminophen (NORCO/VICODIN) 5-325 MG tablet Take 0.5-1 tablets by mouth every 6 (six) hours as needed for moderate pain. 20 tablet 0  . Omega-3 Fatty Acids (FISH OIL) 1200 MG CAPS Take 1 capsule by mouth daily.    Vladimir Faster Glycol-Propyl Glycol (SYSTANE OP) Apply 1 drop to eye as needed (for dry eyes).    . polyethylene glycol (MIRALAX / GLYCOLAX) packet Take 17 g by mouth daily as needed for mild constipation. 14 each 0  . Probiotic Product (ALIGN) 4 MG CAPS Take 1 capsule by mouth daily.     No current facility-administered medications on  file prior to visit.    Allergies  Allergen Reactions  . Bee Venom Anaphylaxis  . Contrast Media [Iodinated Diagnostic Agents] Hives  . Lipitor [Atorvastatin] Swelling  . Ace Inhibitors Swelling  . Hydrocodone Other (See Comments)    Reaction:  Unknown   . Macrodantin Other (See Comments)    Reaction:  Chest pain   . Neosporin [Neomycin-Bacitracin Zn-Polymyx] Other (See Comments)    Reaction:  Burning   . Sulfa Antibiotics Hives  . Epinephrine Palpitations  . Prednisone Rash and Other (See Comments)    Reaction:  Dizziness and headache     Past Medical History:  Diagnosis Date  . AP (angina pectoris) ([place])   . Claustrophobia   . Diverticulosis of colon   . Hiatal hernia   . HLD (hyperlipidemia)   . HTN (hypertension)   . Hypogammaglobulinemia ([place])    borderline  . MVP (mitral valve prolapse)    and tricuspid leak  . OA (osteoarthritis)   . Pelvic kidney    lower left side  . Pernicious anemia   . Sciatica   . TIA (transient ischemic attack) 8/16    Past Surgical History:  Procedure Laterality Date  . APPENDECTOMY    . BASAL CELL CARCINOMA EXCISION     Back  . BREAST BIOPSY Right    neg  . BREAST LUMPECTOMY     Right (fatty tumor)  . CATARACT EXTRACTION W/ INTRAOCULAR LENS  IMPLANT, BILATERAL Bilateral   . CHOLECYSTECTOMY OPEN    . DILATION AND CURETTAGE OF UTERUS    . excision vestibular glands    . Film removed Left eye    . INGUINAL HERNIA REPAIR     Left side  . INTRAMEDULLARY (IM) NAIL INTERTROCHANTERIC Right 01/14/2017   Procedure: ORIF RIGHT INTERTROCH NAIL;  Surgeon: Paralee Cancel, MD;  Location: [facility];  Service: Orthopedics;  Laterality: Right;  . KNEE ARTHROSCOPY W/ PARTIAL MEDIAL MENISCECTOMY  08/2009   right  . partial hip surgery    . Right ovary and tube corpus    . TONSILLECTOMY    . TOTAL ABDOMINAL HYSTERECTOMY      Family History  Problem Relation Age of Onset  . Heart attack Father 36       CABG  . Hypertension Father   . Dementia  Mother   . Cancer Maternal Aunt        Breast-multiple aunts and Gm  . Cancer Maternal Uncle        Bladder and lung  . Diabetes Paternal Grandmother   . Hypertension Brother     Social History   Socioeconomic History  . Marital status: Married    Spouse name: Not on file  . Number of children: 2  . Years of education: Not on file  . Highest education level: Not on file  Occupational History  . Occupation: Retired-bookkeeping/secretarial work    Comment: retired  . Occupation:    Tobacco Use  . Smoking status: Never Smoker  . Smokeless tobacco: Never Used  Substance and Sexual Activity  . Alcohol use: Yes    Alcohol/week: 3.0 standard drinks    Types: 3 Glasses of wine per week  . Drug use: No  . Sexual activity: Not on file  Other Topics Concern  . Not on file  Social History Narrative   Has living will and advance directives.    Requests husband, then daughter to make health care decisions.   Would accept resuscitation but no life prolonging technology if cognitively unaware   Social Determinants of Health   Financial Resource Strain:   . Difficulty of Paying Living Expenses:   Food Insecurity:   . Worried About Charity fundraiser in the Last Year:   . Arboriculturist in the Last Year:   Transportation Needs:   . Film/video editor (Medical):   Marland Kitchen Lack of Transportation (Non-Medical):   Physical Activity:   . Days of Exercise per Week:   . Minutes of Exercise per Session:   Stress:   . Feeling of Stress :   Social Connections:   . Frequency of Communication with Friends and Family:   . Frequency of Social Gatherings with Friends and Family:   . Attends Religious Services:   . Active Member of Clubs or Organizations:   . Attends Archivist Meetings:   Marland Kitchen Marital Status:   Intimate Partner Violence:   . Fear of Current or Ex-Partner:   . Emotionally Abused:   Marland Kitchen Physically Abused:   . Sexually Abused:    Review of Systems No back  pain Daughter concerned due to some right ankle swelling Some pain when walking No chest pain or SOB Has gained some weight---"I eat too much and I don't do much"     Objective:   Physical Exam  Nursing note and vitals reviewed. Constitutional: No distress.  Cardiovascular: Normal rate, regular rhythm and normal pulses. Exam reveals no  gallop.  No murmur heard. Respiratory: Effort normal and breath sounds normal. She has no wheezes. She has no rhonchi. She has no rales.  GI: Soft. There is no abdominal tenderness.  Musculoskeletal:     Cervical back: Neck supple.     Comments: Slight edema in right ankle Left ankle clear Some left ankle tenderness but no pain with passive ROM  Can't get up on table  Lymphadenopathy:    She has no cervical adenopathy.  Neurological: She is alert.           Assessment & Plan:

## 2020-06-10 NOTE — Addendum Note (Signed)
Addended by: Pilar Grammes on: 06/10/2020 12:00 PM   Modules accepted: Orders

## 2020-06-10 NOTE — Assessment & Plan Note (Signed)
BP Readings from Last 3 Encounters:  06/10/20 110/70  01/21/20 136/70  05/21/19 (!) 185/78   Doing well on HCTZ Overdue for labs---will check today

## 2020-06-10 NOTE — Assessment & Plan Note (Signed)
Similar to past symptoms Some systemic symptoms so will treat for a week Will give augmentin ---- tolerated and past culture showed sensitive Klebsiella (will send culture again)

## 2020-06-12 LAB — URINE CULTURE
MICRO NUMBER:: 10581548
SPECIMEN QUALITY:: ADEQUATE

## 2020-06-13 ENCOUNTER — Telehealth: Payer: Self-pay

## 2020-06-13 NOTE — Telephone Encounter (Signed)
Per Dr Silvio Pate Lab Note: The urine culture does show infection--but the antibiotic should work against it. The blood work was fine. The cholesterol is down a bit since the last time. The borderline low thyroid test (free T4) is not clearly an issue---we will discuss this at your upcoming appointment. The other minor abnormalities are not worrisome.  He asked that I call her to see if she was doing better. Our phones are down so I tried calling from my cell by blocking my number. There was no answer and no VM to leave a message.

## 2020-06-14 NOTE — Telephone Encounter (Signed)
noted 

## 2020-06-14 NOTE — Telephone Encounter (Signed)
Patient notified as instructed by telephone and verbalized understanding. Patient stated that she is feeling much better. Advised patient to call back if she does not continue to improve and she verbalized understanding.

## 2020-07-21 ENCOUNTER — Ambulatory Visit: Payer: Medicare Other | Admitting: Internal Medicine

## 2020-08-02 ENCOUNTER — Telehealth: Payer: Self-pay | Admitting: *Deleted

## 2020-08-02 NOTE — Telephone Encounter (Signed)
Pt's spouse called to r/s pt's MCR AWV for this Thursday due to pt having a fall. They r/s appt to 08/26/20 but spouse did want PCP to know that pt had a fall a week ago, they called EMS to help, they did get her off the floor and advised pt to go to the ER but pt refused. Spouse said he is in his 88 and his son has a bad back so they are afraid to lift pt and help her get to an appt. Spouse wanted PCP to know he is concerned about pt falling but wanted to see if there was any community resources to help pt get to the office for an appt so PCP can eval her pain after her fall   Spouse request cb # [phone]

## 2020-08-02 NOTE — Telephone Encounter (Signed)
We can certainly consider physical therapy for strengthening--but she needs to be evaluated first

## 2020-08-02 NOTE — Telephone Encounter (Signed)
Tried to call husband. No answer and no VM to leave a message. Will try again later.

## 2020-08-04 ENCOUNTER — Ambulatory Visit: Payer: Medicare Other | Admitting: Internal Medicine

## 2020-08-05 ENCOUNTER — Emergency Department: Payer: Medicare Other

## 2020-08-05 ENCOUNTER — Other Ambulatory Visit: Payer: Self-pay

## 2020-08-05 ENCOUNTER — Encounter: Payer: Self-pay | Admitting: Emergency Medicine

## 2020-08-05 ENCOUNTER — Telehealth: Payer: Self-pay | Admitting: Internal Medicine

## 2020-08-05 ENCOUNTER — Emergency Department
Admission: EM | Admit: 2020-08-05 | Discharge: 2020-08-05 | Disposition: A | Discharge status: Home / Self Care | Payer: Medicare Other | Attending: Student in an Organized Health Care Education/Training Program | Admitting: Student in an Organized Health Care Education/Training Program

## 2020-08-05 DIAGNOSIS — Y998 Other external cause status: Secondary | ICD-10-CM | POA: Insufficient documentation

## 2020-08-05 DIAGNOSIS — M25561 Pain in right knee: Secondary | ICD-10-CM | POA: Diagnosis not present

## 2020-08-05 DIAGNOSIS — M25461 Effusion, right knee: Secondary | ICD-10-CM | POA: Diagnosis not present

## 2020-08-05 DIAGNOSIS — Y92009 Unspecified place in unspecified non-institutional (private) residence as the place of occurrence of the external cause: Secondary | ICD-10-CM | POA: Insufficient documentation

## 2020-08-05 DIAGNOSIS — M545 Low back pain: Secondary | ICD-10-CM | POA: Diagnosis not present

## 2020-08-05 DIAGNOSIS — M1711 Unilateral primary osteoarthritis, right knee: Secondary | ICD-10-CM | POA: Diagnosis not present

## 2020-08-05 DIAGNOSIS — Z7982 Long term (current) use of aspirin: Secondary | ICD-10-CM | POA: Insufficient documentation

## 2020-08-05 DIAGNOSIS — M7989 Other specified soft tissue disorders: Secondary | ICD-10-CM | POA: Diagnosis not present

## 2020-08-05 DIAGNOSIS — Y9389 Activity, other specified: Secondary | ICD-10-CM | POA: Insufficient documentation

## 2020-08-05 DIAGNOSIS — W19XXXA Unspecified fall, initial encounter: Secondary | ICD-10-CM | POA: Diagnosis not present

## 2020-08-05 DIAGNOSIS — R52 Pain, unspecified: Secondary | ICD-10-CM | POA: Diagnosis not present

## 2020-08-05 DIAGNOSIS — I10 Essential (primary) hypertension: Secondary | ICD-10-CM | POA: Insufficient documentation

## 2020-08-05 DIAGNOSIS — S99912A Unspecified injury of left ankle, initial encounter: Secondary | ICD-10-CM | POA: Diagnosis not present

## 2020-08-05 DIAGNOSIS — S8992XA Unspecified injury of left lower leg, initial encounter: Secondary | ICD-10-CM | POA: Diagnosis not present

## 2020-08-05 DIAGNOSIS — M25551 Pain in right hip: Secondary | ICD-10-CM | POA: Diagnosis not present

## 2020-08-05 DIAGNOSIS — S8991XA Unspecified injury of right lower leg, initial encounter: Secondary | ICD-10-CM | POA: Diagnosis not present

## 2020-08-05 DIAGNOSIS — M79606 Pain in leg, unspecified: Secondary | ICD-10-CM | POA: Diagnosis not present

## 2020-08-05 NOTE — ED Provider Notes (Signed)
[facility] Emergency Department Provider Note ____________________________________________  Time seen: 1312  I have reviewed the triage vital signs and the nursing notes.  HISTORY  Chief Complaint  Fall and Back Pain  HPI Mallory Rodriguez is a 86 y.o. female presents to the ED from home via EMS, for evaluation following mechanical fall.  Patient describes a fall occurred last week at home, when she fell to have her walker nearby.  She describes falling backwards onto her lower back.  Since that time she has had pain to the lower back and pain primarily to the right knee.  She also reports some swelling to the right knee.  Patient describes only a minor contusion to the head but denies any LOC, and denies any preceding precipitating the fall.  She presents now for evaluation of her injuries.   Past Medical History:  Diagnosis Date  . AP (angina pectoris) ([place])   . Claustrophobia   . Diverticulosis of colon   . Hiatal hernia   . HLD (hyperlipidemia)   . HTN (hypertension)   . Hypogammaglobulinemia ([place])    borderline  . MVP (mitral valve prolapse)    and tricuspid leak  . OA (osteoarthritis)   . Pelvic kidney    lower left side  . Pernicious anemia   . Sciatica   . TIA (transient ischemic attack) 8/16    Patient Active Problem List   Diagnosis Date Noted  . Edema 06/10/2020  . Lumbar spine pain 01/21/2020  . Right hip pain 06/24/2018  . Easy bruising 06/24/2018  . Closed right hip fracture, sequela 01/14/2017  . Advance directive discussed with patient 12/13/2015  . CVA (cerebral vascular accident) ([place]) 08/31/2015  . Hemiplegia of nondominant side, late effect of cerebrovascular disease ([place]) 08/17/2015  . Tremor 11/30/2014  . Allergic rhinitis due to pollen 05/18/2013  . Routine general medical examination at a health care facility 11/18/2012  . UTI (urinary tract infection) 02/22/2011  . Osteoarthritis, multiple sites 11/07/2010  . Hyperlipemia  11/02/2010  . Pernicious anemia 11/02/2010  . Mood disorder ([place]) 02/09/2010  . HYPERTENSION, BENIGN 02/09/2010  . Mitral valve disorder 02/09/2010    Past Surgical History:  Procedure Laterality Date  . APPENDECTOMY    . BASAL CELL CARCINOMA EXCISION     Back  . BREAST BIOPSY Right    neg  . BREAST LUMPECTOMY     Right (fatty tumor)  . CATARACT EXTRACTION W/ INTRAOCULAR LENS  IMPLANT, BILATERAL Bilateral   . CHOLECYSTECTOMY OPEN    . DILATION AND CURETTAGE OF UTERUS    . excision vestibular glands    . Film removed Left eye    . INGUINAL HERNIA REPAIR     Left side  . INTRAMEDULLARY (IM) NAIL INTERTROCHANTERIC Right 01/14/2017   Procedure: ORIF RIGHT INTERTROCH NAIL;  Surgeon: Paralee Cancel, MD;  Location: [facility];  Service: Orthopedics;  Laterality: Right;  . KNEE ARTHROSCOPY W/ PARTIAL MEDIAL MENISCECTOMY  08/2009   right  . partial hip surgery    . Right ovary and tube corpus    . TONSILLECTOMY    . TOTAL ABDOMINAL HYSTERECTOMY      Prior to Admission medications   Medication Sig Start Date End Date Taking? Authorizing Provider  acetaminophen (TYLENOL) 650 MG CR tablet Take 650 mg by mouth 3 (three) times daily.    [provider]  amoxicillin-clavulanate (AUGMENTIN) 875-125 MG tablet Take 1 tablet by mouth 2 (two) times daily. 06/10/20   Venia Carbon, MD  aspirin EC 81 MG tablet Take 81 mg by mouth daily.    [provider]  CALCIUM-VITAMIN D-VITAMIN K PO Take 1 each by mouth daily.    [provider]  clopidogrel (PLAVIX) 75 MG tablet TAKE 1 TABLET BY MOUTH EVERY DAY 03/21/20   [name] I, MD  cyanocobalamin (,VITAMIN B-12,) 1000 MCG/ML injection Inject 1,000 mcg into the muscle every 3 (three) months.    [provider]  EPINEPHrine (EPIPEN JR) 0.15 MG/0.3ML injection INJECT ONE SYRINGEFUL INTRAMUSCULARLY AS NEEDED FOR ANAPHYLAXIS 10/16/19   [name] I, MD  hydrochlorothiazide (HYDRODIURIL) 25 MG tablet TAKE 1 TABLET (25  MG TOTAL) BY MOUTH DAILY. NEEDS OFFICE VISIT 03/07/20   Venia Carbon, MD  HYDROcodone-acetaminophen (NORCO/VICODIN) 5-325 MG tablet Take 0.5-1 tablets by mouth every 6 (six) hours as needed for moderate pain. 01/21/20   Venia Carbon, MD  Omega-3 Fatty Acids (FISH OIL) 1200 MG CAPS Take 1 capsule by mouth daily.    [provider]  Polyethyl Glycol-Propyl Glycol (SYSTANE OP) Apply 1 drop to eye as needed (for dry eyes).    [provider]  polyethylene glycol (MIRALAX / GLYCOLAX) packet Take 17 g by mouth daily as needed for mild constipation. 01/15/17   Paralee Cancel, MD  Probiotic Product (ALIGN) 4 MG CAPS Take 1 capsule by mouth daily.    [provider]    Allergies Bee venom, Contrast media [iodinated diagnostic agents], Lipitor [atorvastatin], Ace inhibitors, Hydrocodone, Macrodantin, Neosporin [neomycin-bacitracin zn-polymyx], Sulfa antibiotics, Epinephrine, and Prednisone  Family History  Problem Relation Age of Onset  . Heart attack Father 38       CABG  . Hypertension Father   . Dementia Mother   . Cancer Maternal Aunt        Breast-multiple aunts and Gm  . Cancer Maternal Uncle        Bladder and lung  . Diabetes Paternal Grandmother   . Hypertension Brother     Social History Social History   Tobacco Use  . Smoking status: Never Smoker  . Smokeless tobacco: Never Used  Substance Use Topics  . Alcohol use: Yes    Alcohol/week: 3.0 standard drinks    Types: 3 Glasses of wine per week  . Drug use: No    Review of Systems  Constitutional: Negative for fever. Eyes: Negative for visual changes. ENT: Negative for sore throat. Cardiovascular: Negative for chest pain. Respiratory: Negative for shortness of breath. Gastrointestinal: Negative for abdominal pain, vomiting and diarrhea. Genitourinary: Negative for dysuria. Musculoskeletal: Positive for back pain.  Reports right knee pain. Skin: Negative for rash. Neurological: Negative  for headaches, focal weakness or numbness. ____________________________________________  PHYSICAL EXAM:  VITAL SIGNS: ED Triage Vitals  Enc Vitals Group     BP 08/05/20 1234 140/62     Pulse Rate 08/05/20 1234 96     Resp 08/05/20 1234 18     Temp 08/05/20 1234 98.3 F (36.8 C)     Temp Source 08/05/20 1234 Oral     SpO2 08/05/20 1234 97 %     Weight 08/05/20 1235 130 lb (59 kg)     Height 08/05/20 1235 5\' 3"  (1.6 m)     Head Circumference --      Peak Flow --      Pain Score 08/05/20 1234 7     Pain Loc --      Pain Edu? --      Excl. in [place]? --  Constitutional: Alert and oriented. Well appearing and in no distress. Head: Normocephalic and atraumatic. Eyes: Conjunctivae are normal. Normal extraocular movements Neck: Supple.  Normal range of motion without crepitus. Cardiovascular: Normal rate, regular rhythm. Normal distal pulses. Respiratory: Normal respiratory effort. No wheezes/rales/rhonchi. Gastrointestinal: Soft and nontender. No distention. Musculoskeletal: Right knee without obvious deformity or dislocation.  There are some soft tissue swelling without effusion.  Patient is able to actively flex and extend the knee without difficulty.  No hip pain on flexion or palpation.  Mildly tender to the hip pointer.  No distracting midline tenderness is noted to the spine.  There is no midline tenderness, spasm, deformity, or step-off.  Nontender with normal range of motion in all extremities.  Neurologic: Cranial nerves II through XII grossly intact. Normal speech and language. No gross focal neurologic deficits are appreciated. Skin:  Skin is warm, dry and intact. No rash noted. Psychiatric: Mood and affect are normal. Patient exhibits appropriate insight and judgment. ____________________________________________   RADIOLOGY  DG Left Knee IMPRESSION: No acute abnormality noted.  DG Left Ankle IMPRESSION: Mild soft tissue swelling without acute bony abnormality.  DG  Right Knee IMPRESSION: 1. Minimal anterior swelling with small suprapatellar effusion. No acute osseous abnormality. 2. Progressive moderate tricompartmental degenerative changes. 3. Osseous structures appear diffusely demineralized.  DG Lumbar Spine IMPRESSION: Advanced multilevel lumbar spondylosis without acute fracture. No evidence of interval change from prior study. ____________________________________________  PROCEDURES  Jones-Watson wrap - right knee Procedures ____________________________________________  INITIAL IMPRESSION / ASSESSMENT AND PLAN / ED COURSE  Geriatric patient ED evaluation of injury sustained following a mechanical fall at home 1 week prior.  Patient's primary complaint was of low back pain as well as some pain.  X-rays were negative and reassuring at this time showing only underlying degenerative changes on all x-rays performed.  Patient's order for x-ray was initially placed for the left knee, inadvertently.  This discrepancy to the patient x-ray.  Patient with no acute findings of fracture or dislocation to the spine, knees, or.  She is discharged with a Doran Durand wrap to the affected right knee at this time.  She will follow-up with primary provider return to the ED as needed.  Husband is present in the ED for discharge for patient to return home.  She will follow-up with her primary provider return to the ED as needed.  Mallory Rodriguez was evaluated in Emergency Department on 08/05/2020 for the symptoms described in the history of present illness. She was evaluated in the context of the global COVID-19 pandemic, which necessitated consideration that the patient might be at risk for infection with the SARS-CoV-2 virus that causes COVID-19. Institutional protocols and algorithms that pertain to the evaluation of patients at risk for COVID-19 are in a state of rapid change based on information released by regulatory bodies including the CDC and federal and state  organizations. These policies and algorithms were followed during the patient's care in the ED. ____________________________________________  FINAL CLINICAL IMPRESSION(S) / ED DIAGNOSES  Final diagnoses:  Fall in home, initial encounter  Acute pain of right knee      Draysen Weygandt, Dannielle Karvonen, PA-C 08/05/20 1852    Merlyn Lot, MD 08/08/20 [phone]

## 2020-08-05 NOTE — ED Triage Notes (Signed)
See this RN's first RN note, pt states has been walking around after fall. Pt A&O x4. Rates pain 7/10 at this time.

## 2020-08-05 NOTE — ED Notes (Signed)
See triage note  Presents via EMS s/p fall   States she was setting something on the table  Turned around to reach her walker   Fell backward  Hit her head  And back  Having pain to right hip/knee  Unable to bear wt

## 2020-08-05 NOTE — ED Triage Notes (Signed)
First RN Note: Pt presents to ED via [facility] with c/o fall last week, fell on back. Per [facility], pt with pain radiates from R hip all the way down her leg with swelling to R knee.

## 2020-08-05 NOTE — Discharge Instructions (Signed)
Your exam and x-rays are negative at this time following a fall at home.  You have no fractures to the back or knees.  You may experience some pain and stiffness over the next few days related to fall.  Take your home medications or Tylenol as needed for pain.  Follow-up with your provider for ongoing symptoms.

## 2020-08-05 NOTE — Telephone Encounter (Signed)
Okay I will await the ER evaluation. 

## 2020-08-05 NOTE — Telephone Encounter (Signed)
pts daughter Shantele (DPR signed) pt does get up and walk very short distance and complains of pain in rt knee when trying to walk and even when moving in bed; pt using porta potty at bed and not going to bathroom.Pts daughter said pt struggles even trying to use bed rail to sit up in bed. Tayra will have EMS take pt to [facility] ED for eval and possible xrays. FYI to Dr Silvio Pate.

## 2020-08-11 ENCOUNTER — Telehealth: Payer: Self-pay | Admitting: Internal Medicine

## 2020-08-11 NOTE — Telephone Encounter (Addendum)
Spoke to pt's husband. He said he would not be able to bring her in the office without transportation help.

## 2020-08-11 NOTE — Telephone Encounter (Signed)
After reviewing the note---I don't think she needs to keep it wrapped unless that makes it feel better. If she has ongoing problems with it, I can check it in the office

## 2020-08-11 NOTE — Telephone Encounter (Signed)
Spouse called stating pt went to er 8/6 for knee strain.  He stated the hospital wrapped pt knee and he is wanting to know if pt needs to keep the wrapped on knee or take it off

## 2020-08-26 ENCOUNTER — Ambulatory Visit: Payer: Medicare Other | Admitting: Internal Medicine

## 2020-09-02 ENCOUNTER — Other Ambulatory Visit: Payer: Self-pay | Admitting: Internal Medicine

## 2020-09-27 DIAGNOSIS — Z23 Encounter for immunization: Secondary | ICD-10-CM | POA: Diagnosis not present

## 2020-09-30 ENCOUNTER — Telehealth: Payer: Self-pay | Admitting: Internal Medicine

## 2020-09-30 NOTE — Telephone Encounter (Signed)
Pt called to r/s her medicare wellness next one I can schedule is dec/jan.  She stated she is over due.  She had to cancel one in aug do to accident   Can she be worked in if so when

## 2020-09-30 NOTE — Telephone Encounter (Signed)
I spoke to patient.  She scheduled appointment on 10/25/20 at 10:45.

## 2020-09-30 NOTE — Telephone Encounter (Signed)
If there is an open spot with a same day next to it, that will be fine. Back to back is okay, also.

## 2020-10-02 ENCOUNTER — Other Ambulatory Visit: Payer: Self-pay | Admitting: Internal Medicine

## 2020-10-25 ENCOUNTER — Ambulatory Visit (INDEPENDENT_AMBULATORY_CARE_PROVIDER_SITE_OTHER): Payer: Medicare Other | Admitting: Internal Medicine

## 2020-10-25 ENCOUNTER — Other Ambulatory Visit: Payer: Self-pay

## 2020-10-25 ENCOUNTER — Encounter: Payer: Self-pay | Admitting: Internal Medicine

## 2020-10-25 VITALS — BP 104/66 | HR 77 | Temp 97.6°F | Ht 61.5 in | Wt 151.0 lb

## 2020-10-25 DIAGNOSIS — Z7189 Other specified counseling: Secondary | ICD-10-CM

## 2020-10-25 DIAGNOSIS — Z0000 Encounter for general adult medical examination without abnormal findings: Secondary | ICD-10-CM

## 2020-10-25 DIAGNOSIS — I10 Essential (primary) hypertension: Secondary | ICD-10-CM

## 2020-10-25 DIAGNOSIS — I69959 Hemiplegia and hemiparesis following unspecified cerebrovascular disease affecting unspecified side: Secondary | ICD-10-CM

## 2020-10-25 DIAGNOSIS — E785 Hyperlipidemia, unspecified: Secondary | ICD-10-CM | POA: Diagnosis not present

## 2020-10-25 DIAGNOSIS — Z23 Encounter for immunization: Secondary | ICD-10-CM | POA: Diagnosis not present

## 2020-10-25 DIAGNOSIS — M8949 Other hypertrophic osteoarthropathy, multiple sites: Secondary | ICD-10-CM

## 2020-10-25 DIAGNOSIS — M159 Polyosteoarthritis, unspecified: Secondary | ICD-10-CM

## 2020-10-25 MED ORDER — ROSUVASTATIN CALCIUM 5 MG PO TABS: 5.0000 mg | ORAL_TABLET | Freq: Every day | ORAL | 3 refills | Status: DC

## 2020-10-25 NOTE — Assessment & Plan Note (Signed)
BP Readings from Last 3 Encounters:  10/25/20 104/66  08/05/20 140/62  06/10/20 110/70   Good control on HCTZ

## 2020-10-25 NOTE — Assessment & Plan Note (Signed)
Still with right sided weakness Will stop plavix (many years after stroke and troublesome bruising) Add statin again

## 2020-10-25 NOTE — Addendum Note (Signed)
Addended by: Pilar Grammes on: 10/25/2020 11:45 AM   Modules accepted: Orders

## 2020-10-25 NOTE — Assessment & Plan Note (Signed)
See social history 

## 2020-10-25 NOTE — Progress Notes (Signed)
Subjective:    Patient ID: Mallory Rodriguez, female    DOB: 03-22-1933, 86 y.o.   MRN: [id number]  HPI Here with daughter Lettie for Medicare wellness visit and follow up of chronic health conditions This visit occurred during the SARS-CoV-2 public health emergency.  Safety protocols were in place, including screening questions prior to the visit, additional usage of staff PPE, and extensive cleaning of exam room while observing appropriate contact time as indicated for disinfecting solutions.   Reviewed form and her records--and advanced directives Reviewed other doctors--she has extensive list Has wine twice a week or so No tobacco Not really exercising Vision is okay--new glasses Some hearing problems---has left hearing aide but doesn't use it consistently No depression or anhedonia--though bored with sitting home during the pandemic. Often spends much of day in bed--watching TV She and husband don't drive Lives with daughter. She does dishes and their laundry----daughter does the rest of the instrumental ADLs Ongoing memory issues--word finding and memory issues since stroke. Some decline  Recent fall--in August Fell and twisted in kitchen---hit head and back She couldn't move--fireman had to get her up. Refused ER but then went later for worsened pain Sprained knee--now better [facility] with rollator all the time No other falls  No recent urinary problems  Did have blood work in June GFR 54 Everything else okay  Still has mild right side weakness Continues on plavix and aspirin Stopped the lovastatin---felt it bruised her badly. Also failed lipitor  No chest pain or SOB No dizziness or syncope No palpitations No edema  Current Outpatient Medications on File Prior to Visit  Medication Sig Dispense Refill  . acetaminophen (TYLENOL) 650 MG CR tablet Take 650 mg by mouth 3 (three) times daily.    Marland Kitchen aspirin EC 81 MG tablet Take 81 mg by mouth daily.    Marland Kitchen CALCIUM-VITAMIN  D-VITAMIN K PO Take 1 each by mouth daily.    . clopidogrel (PLAVIX) 75 MG tablet TAKE 1 TABLET BY MOUTH EVERY DAY 90 tablet 0  . cyanocobalamin (,VITAMIN B-12,) 1000 MCG/ML injection Inject 1,000 mcg into the muscle every 3 (three) months.    . EPINEPHrine (EPIPEN JR) 0.15 MG/0.3ML injection INJECT ONE SYRINGEFUL INTRAMUSCULARLY AS NEEDED FOR ANAPHYLAXIS 2 each 0  . hydrochlorothiazide (HYDRODIURIL) 25 MG tablet TAKE 1 TABLET (25 MG TOTAL) BY MOUTH DAILY. NEEDS OFFICE VISIT 90 tablet 0  . Omega-3 Fatty Acids (FISH OIL) 1200 MG CAPS Take 1 capsule by mouth daily.    Vladimir Faster Glycol-Propyl Glycol (SYSTANE OP) Apply 1 drop to eye as needed (for dry eyes).    . polyethylene glycol (MIRALAX / GLYCOLAX) packet Take 17 g by mouth daily as needed for mild constipation. 14 each 0  . Probiotic Product (ALIGN) 4 MG CAPS Take 1 capsule by mouth daily.     No current facility-administered medications on file prior to visit.    Allergies  Allergen Reactions  . Bee Venom Anaphylaxis  . Contrast Media [Iodinated Diagnostic Agents] Hives  . Lipitor [Atorvastatin] Swelling  . Ace Inhibitors Swelling  . Hydrocodone Other (See Comments)    Reaction:  Unknown   . Macrodantin Other (See Comments)    Reaction:  Chest pain   . Neosporin [Neomycin-Bacitracin Zn-Polymyx] Other (See Comments)    Reaction:  Burning   . Sulfa Antibiotics Hives  . Epinephrine Palpitations  . Prednisone Rash and Other (See Comments)    Reaction:  Dizziness and headache     Past Medical History:  Diagnosis Date  . AP (angina pectoris) ([place])   . Claustrophobia   . Diverticulosis of colon   . Hiatal hernia   . HLD (hyperlipidemia)   . HTN (hypertension)   . Hypogammaglobulinemia ([place])    borderline  . MVP (mitral valve prolapse)    and tricuspid leak  . OA (osteoarthritis)   . Pelvic kidney    lower left side  . Pernicious anemia   . Sciatica   . TIA (transient ischemic attack) 8/16    Past Surgical History:    Procedure Laterality Date  . APPENDECTOMY    . BASAL CELL CARCINOMA EXCISION     Back  . BREAST BIOPSY Right    neg  . BREAST LUMPECTOMY     Right (fatty tumor)  . CATARACT EXTRACTION W/ INTRAOCULAR LENS  IMPLANT, BILATERAL Bilateral   . CHOLECYSTECTOMY OPEN    . DILATION AND CURETTAGE OF UTERUS    . excision vestibular glands    . Film removed Left eye    . INGUINAL HERNIA REPAIR     Left side  . INTRAMEDULLARY (IM) NAIL INTERTROCHANTERIC Right 01/14/2017   Procedure: ORIF RIGHT INTERTROCH NAIL;  Surgeon: Paralee Cancel, MD;  Location: [facility];  Service: Orthopedics;  Laterality: Right;  . KNEE ARTHROSCOPY W/ PARTIAL MEDIAL MENISCECTOMY  08/2009   right  . partial hip surgery    . Right ovary and tube corpus    . TONSILLECTOMY    . TOTAL ABDOMINAL HYSTERECTOMY      Family History  Problem Relation Age of Onset  . Heart attack Father 17       CABG  . Hypertension Father   . Dementia Mother   . Cancer Maternal Aunt        Breast-multiple aunts and Gm  . Cancer Maternal Uncle        Bladder and lung  . Diabetes Paternal Grandmother   . Hypertension Brother     Social History   Socioeconomic History  . Marital status: Married    Spouse name: Not on file  . Number of children: 2  . Years of education: Not on file  . Highest education level: Not on file  Occupational History  . Occupation: Retired-bookkeeping/secretarial work    Comment: retired  . Occupation:    Tobacco Use  . Smoking status: Never Smoker  . Smokeless tobacco: Never Used  Substance and Sexual Activity  . Alcohol use: Yes    Alcohol/week: 3.0 standard drinks    Types: 3 Glasses of wine per week  . Drug use: No  . Sexual activity: Not on file  Other Topics Concern  . Not on file  Social History Narrative   Has living will and advance directives.    Requests daughter to make health care decisions.   Would accept resuscitation but no life prolonging technology if cognitively unaware   Social  Determinants of Health   Financial Resource Strain:   . Difficulty of Paying Living Expenses: Not on file  Food Insecurity:   . Worried About Charity fundraiser in the Last Year: Not on file  . Ran Out of Food in the Last Year: Not on file  Transportation Needs:   . Lack of Transportation (Medical): Not on file  . Lack of Transportation (Non-Medical): Not on file  Physical Activity:   . Days of Exercise per Week: Not on file  . Minutes of Exercise per Session: Not on file  Stress:   . Feeling  of Stress : Not on file  Social Connections:   . Frequency of Communication with Friends and Family: Not on file  . Frequency of Social Gatherings with Friends and Family: Not on file  . Attends Religious Services: Not on file  . Active Member of Clubs or Organizations: Not on file  . Attends Archivist Meetings: Not on file  . Marital Status: Not on file  Intimate Partner Violence:   . Fear of Current or Ex-Partner: Not on file  . Emotionally Abused: Not on file  . Physically Abused: Not on file  . Sexually Abused: Not on file   Review of Systems Appetite is okay Weight is stable Wears seat belt Teeth are okay---keeps up with dentist No heartburn or dysphagia Bowels move regularly--no blood No dysuria or hematuria. Remains continent No sig back or joint pains No skin problems    Objective:   Physical Exam Constitutional:      Appearance: Normal appearance.  HENT:     Mouth/Throat:     Comments: No lesions Eyes:     Conjunctiva/sclera: Conjunctivae normal.     Pupils: Pupils are equal, round, and reactive to light.  Cardiovascular:     Rate and Rhythm: Normal rate and regular rhythm.     Pulses: Normal pulses.     Heart sounds: No murmur heard.  No gallop.   Pulmonary:     Effort: Pulmonary effort is normal.     Breath sounds: Normal breath sounds. No wheezing or rales.  Abdominal:     Palpations: Abdomen is soft.     Tenderness: There is no abdominal  tenderness.  Musculoskeletal:     Right lower leg: No edema.     Left lower leg: No edema.  Skin:    General: Skin is warm.     Findings: No rash.  Neurological:     Mental Status: She is alert and oriented to person, place, and time.     Comments: President--- "Biden, Trump, Obama" Doesn't like numbers D-[id number] Recall 3/3  Mild right arm weakness---moderate right leg weakness  Psychiatric:        Mood and Affect: Mood normal.        Behavior: Behavior normal.            Assessment & Plan:

## 2020-10-25 NOTE — Patient Instructions (Signed)
Please stop the clopidogrel--that should help the easy bruising. Try the rosuvastatin 5mg  daily. If you have any problems, cut back to every other day.

## 2020-10-25 NOTE — Assessment & Plan Note (Signed)
Seems better now Can use tylenol prn

## 2020-10-25 NOTE — Progress Notes (Signed)
Hearing Screening   125Hz  250Hz  500Hz  1000Hz  2000Hz  3000Hz  4000Hz  6000Hz  8000Hz   Right ear:           Left ear:           Comments: Has a hearing aid. Not wearing it today   Visual Acuity Screening   Right eye Left eye Both eyes  Without correction:     With correction: 20/25 20/25 20/20

## 2020-10-25 NOTE — Assessment & Plan Note (Signed)
I have personally reviewed the Medicare Annual Wellness questionnaire and have noted 1. The patient's medical and social history 2. Their use of alcohol, tobacco or illicit drugs 3. Their current medications and supplements 4. The patient's functional ability including ADL's, fall risks, home safety risks and hearing or visual             impairment. 5. Diet and physical activities 6. Evidence for depression or mood disorders  The patients weight, height, BMI and visual acuity have been recorded in the chart I have made referrals, counseling and provided education to the patient based review of the above and I have provided the pt with a written personalized care plan for preventive services.  I have provided you with a copy of your personalized plan for preventive services. Please take the time to review along with your updated medication list.  No cancer screening due to age Had COVID booster Flu vaccine today Discussed exercise--mostly leg strengthening

## 2020-10-25 NOTE — Assessment & Plan Note (Signed)
Will try low dose rosuvastatin given past CVA

## 2020-12-30 ENCOUNTER — Other Ambulatory Visit: Payer: Self-pay | Admitting: Internal Medicine

## 2021-02-24 ENCOUNTER — Other Ambulatory Visit: Payer: Self-pay | Admitting: Internal Medicine

## 2021-02-27 ENCOUNTER — Telehealth: Payer: Self-pay

## 2021-02-27 NOTE — Telephone Encounter (Signed)
I spoke to patient and I let her know handicapped placard form is complete. She asked for form to be mailed to her.  Form mailed to patient.

## 2021-02-27 NOTE — Telephone Encounter (Signed)
Form placed in Dr Letvak's inbox on his desk 

## 2021-02-27 NOTE — Telephone Encounter (Signed)
Patients daughter dropped of handicap placard form to be filled out and would like a call when ready for pick up   [phone]  [place], [place]    Please advise

## 2021-02-27 NOTE — Telephone Encounter (Signed)
Form signed No charge

## 2021-06-15 ENCOUNTER — Emergency Department
Admission: EM | Admit: 2021-06-15 | Discharge: 2021-06-15 | Disposition: A | Discharge status: Home / Self Care | Payer: Medicare Other | Attending: Emergency Medicine | Admitting: Emergency Medicine

## 2021-06-15 ENCOUNTER — Emergency Department: Payer: Medicare Other

## 2021-06-15 ENCOUNTER — Other Ambulatory Visit: Payer: Self-pay

## 2021-06-15 DIAGNOSIS — Z7982 Long term (current) use of aspirin: Secondary | ICD-10-CM | POA: Diagnosis not present

## 2021-06-15 DIAGNOSIS — I10 Essential (primary) hypertension: Secondary | ICD-10-CM | POA: Diagnosis not present

## 2021-06-15 DIAGNOSIS — Z79899 Other long term (current) drug therapy: Secondary | ICD-10-CM | POA: Diagnosis not present

## 2021-06-15 DIAGNOSIS — W01198A Fall on same level from slipping, tripping and stumbling with subsequent striking against other object, initial encounter: Secondary | ICD-10-CM | POA: Diagnosis not present

## 2021-06-15 DIAGNOSIS — R42 Dizziness and giddiness: Secondary | ICD-10-CM | POA: Diagnosis not present

## 2021-06-15 DIAGNOSIS — R0902 Hypoxemia: Secondary | ICD-10-CM | POA: Diagnosis not present

## 2021-06-15 DIAGNOSIS — E860 Dehydration: Secondary | ICD-10-CM | POA: Diagnosis not present

## 2021-06-15 DIAGNOSIS — I499 Cardiac arrhythmia, unspecified: Secondary | ICD-10-CM | POA: Diagnosis not present

## 2021-06-15 DIAGNOSIS — I491 Atrial premature depolarization: Secondary | ICD-10-CM | POA: Diagnosis not present

## 2021-06-15 LAB — BASIC METABOLIC PANEL
Anion gap: 11 (ref 5–15)
BUN: 28 mg/dL — ABNORMAL HIGH (ref 8–23)
CO2: 26 mmol/L (ref 22–32)
Calcium: 9 mg/dL (ref 8.9–10.3)
Chloride: 101 mmol/L (ref 98–111)
Creatinine, Ser: 1 mg/dL (ref 0.44–1.00)
GFR, Estimated: 55 mL/min — ABNORMAL LOW (ref >60)
Glucose, Bld: 111 mg/dL — ABNORMAL HIGH (ref 70–99)
Potassium: 3.5 mmol/L (ref 3.5–5.1)
Sodium: 138 mmol/L (ref 135–145)

## 2021-06-15 LAB — URINALYSIS, COMPLETE (UACMP) WITH MICROSCOPIC
Bacteria, UA: NONE SEEN
Bilirubin Urine: NEGATIVE
Glucose, UA: NEGATIVE mg/dL
Ketones, ur: NEGATIVE mg/dL
Leukocytes,Ua: NEGATIVE
Nitrite: NEGATIVE
Protein, ur: NEGATIVE mg/dL
Specific Gravity, Urine: 1.016 (ref 1.005–1.030)
pH: 6 (ref 5.0–8.0)

## 2021-06-15 LAB — CBC
HCT: 35.6 % — ABNORMAL LOW (ref 36.0–46.0)
Hemoglobin: 12.1 g/dL (ref 12.0–15.0)
MCH: 31.8 pg (ref 26.0–34.0)
MCHC: 34 g/dL (ref 30.0–36.0)
MCV: 93.4 fL (ref 80.0–100.0)
Platelets: 221 10*3/uL (ref 150–400)
RBC: 3.81 MIL/uL — ABNORMAL LOW (ref 3.87–5.11)
RDW: 14.4 % (ref 11.5–15.5)
WBC: 4.5 10*3/uL (ref 4.0–10.5)
nRBC: 0 % (ref 0.0–0.2)

## 2021-06-15 MED ORDER — SODIUM CHLORIDE 0.9 % IV BOLUS
1000.0000 mL | Freq: Once | INTRAVENOUS | Status: AC
  Administered 2021-06-15: 10:00:00 1000 mL via INTRAVENOUS

## 2021-06-15 NOTE — ED Notes (Signed)
Dr. Myrene Buddy, MD at bedside.

## 2021-06-15 NOTE — ED Provider Notes (Signed)
[facility] Emergency Department Provider Note  ____________________________________________   Event Date/Time   First MD Initiated Contact with Patient 06/15/21 0913     (approximate)  I have reviewed the triage vital signs and the nursing notes.   HISTORY  Chief Complaint Dizziness and Fall    HPI Mallory Rodriguez is a 87 y.o. female here with fall.  The patient reports that twice in the last week, she has bent forward and felt dizzy.  She reports that on 2 separate occasions, when she was bending forward to pick something up, she began to feel dizzy and lost her balance.  She fell forward, striking her head.  Denies any actual loss of consciousness.  She does note that she has been somewhat weak when standing, and admits that she does not drink as much fluid as she should.  Denies fevers or chills.  No cough or shortness of breath.  No other recent changes in health.  No recent medication changes.  No recent illness.  No dizziness between these episodes and both have occurred when she is bending forward.  Denies any focal numbness or weakness.  No difficulty speaking or swallowing.    Past Medical History:  Diagnosis Date   AP (angina pectoris) ([place])    Claustrophobia    Diverticulosis of colon    Hiatal hernia    HLD (hyperlipidemia)    HTN (hypertension)    Hypogammaglobulinemia (HCC)    borderline   MVP (mitral valve prolapse)    and tricuspid leak   OA (osteoarthritis)    Pelvic kidney    lower left side   Pernicious anemia    Sciatica    TIA (transient ischemic attack) 8/16    Patient Active Problem List   Diagnosis Date Noted   Lumbar spine pain 01/21/2020   Easy bruising 06/24/2018   Closed right hip fracture, sequela 01/14/2017   Advance directive discussed with patient 12/13/2015   Hemiplegia of nondominant side, late effect of cerebrovascular disease ([place]) 08/17/2015   Tremor 11/30/2014   Allergic rhinitis due to pollen 05/18/2013    Routine general medical examination at a health care facility 11/18/2012   UTI (urinary tract infection) 02/22/2011   Osteoarthritis, multiple sites 11/07/2010   Hyperlipemia 11/02/2010   Pernicious anemia 11/02/2010   HYPERTENSION, BENIGN 02/09/2010   Mitral valve disorder 02/09/2010    Past Surgical History:  Procedure Laterality Date   APPENDECTOMY     BASAL CELL CARCINOMA EXCISION     Back   BREAST BIOPSY Right    neg   BREAST LUMPECTOMY     Right (fatty tumor)   CATARACT EXTRACTION W/ INTRAOCULAR LENS  IMPLANT, BILATERAL Bilateral    CHOLECYSTECTOMY OPEN     DILATION AND CURETTAGE OF UTERUS     excision vestibular glands     Film removed Left eye     INGUINAL HERNIA REPAIR     Left side   INTRAMEDULLARY (IM) NAIL INTERTROCHANTERIC Right 01/14/2017   Procedure: ORIF RIGHT INTERTROCH NAIL;  Surgeon: Paralee Cancel, MD;  Location: [facility];  Service: Orthopedics;  Laterality: Right;   KNEE ARTHROSCOPY W/ PARTIAL MEDIAL MENISCECTOMY  08/2009   right   partial hip surgery     Right ovary and tube corpus     TONSILLECTOMY     TOTAL ABDOMINAL HYSTERECTOMY      Prior to Admission medications   Medication Sig Start Date End Date Taking? Authorizing Provider  EPINEPHrine (EPIPEN JR) 0.15 MG/0.3ML injection  INJECT ONE SYRINGEFUL INTRAMUSCULARLY AS NEEDED FOR ANAPHYLAXIS 02/24/21   Venia Carbon, MD  acetaminophen (TYLENOL) 650 MG CR tablet Take 650 mg by mouth 3 (three) times daily.    [provider]  aspirin EC 81 MG tablet Take 81 mg by mouth daily.    [provider]  CALCIUM-VITAMIN D-VITAMIN K PO Take 1 each by mouth daily.    [provider]  cyanocobalamin (,VITAMIN B-12,) 1000 MCG/ML injection Inject 1,000 mcg into the muscle every 3 (three) months.    [provider]  hydrochlorothiazide (HYDRODIURIL) 25 MG tablet Take 1 tablet (25 mg total) by mouth daily. 01/01/21   Venia Carbon, MD  Omega-3 Fatty Acids (FISH OIL) 1200 MG CAPS  Take 1 capsule by mouth daily.    [provider]  Polyethyl Glycol-Propyl Glycol (SYSTANE OP) Apply 1 drop to eye as needed (for dry eyes).    [provider]  polyethylene glycol (MIRALAX / GLYCOLAX) packet Take 17 g by mouth daily as needed for mild constipation. 01/15/17   Paralee Cancel, MD  Probiotic Product (ALIGN) 4 MG CAPS Take 1 capsule by mouth daily.    [provider]  rosuvastatin (CRESTOR) 5 MG tablet Take 1 tablet (5 mg total) by mouth daily. 10/25/20   Venia Carbon, MD    Allergies Bee venom, Contrast media [iodinated diagnostic agents], Lipitor [atorvastatin], Ace inhibitors, Hydrocodone, Macrodantin, Neosporin [neomycin-bacitracin zn-polymyx], Sulfa antibiotics, Epinephrine, and Prednisone  Family History  Problem Relation Age of Onset   Heart attack Father 22       CABG   Hypertension Father    Dementia Mother    Cancer Maternal Aunt        Breast-multiple aunts and Gm   Cancer Maternal Uncle        Bladder and lung   Diabetes Paternal Grandmother    Hypertension Brother     Social History Social History   Tobacco Use   Smoking status: Never   Smokeless tobacco: Never  Substance Use Topics   Alcohol use: Yes    Alcohol/week: 3.0 standard drinks    Types: 3 Glasses of wine per week   Drug use: No    Review of Systems  Review of Systems  Constitutional:  Positive for fatigue. Negative for fever.  HENT:  Negative for congestion and sore throat.   Eyes:  Negative for visual disturbance.  Respiratory:  Negative for cough and shortness of breath.   Cardiovascular:  Negative for chest pain.  Gastrointestinal:  Negative for abdominal pain, diarrhea, nausea and vomiting.  Genitourinary:  Negative for flank pain.  Musculoskeletal:  Negative for back pain and neck pain.  Skin:  Negative for rash and wound.  Neurological:  Positive for dizziness and weakness.  All other systems reviewed and are negative.    ____________________________________________  PHYSICAL EXAM:      VITAL SIGNS: ED Triage Vitals  Enc Vitals Group     BP 06/15/21 0753 (!) 146/64     Pulse Rate 06/15/21 0753 80     Resp 06/15/21 0753 20     Temp 06/15/21 0753 98.8 F (37.1 C)     Temp Source 06/15/21 0753 Oral     SpO2 06/15/21 0753 98 %     Weight 06/15/21 0754 170 lb (77.1 kg)     Height 06/15/21 0754 5\' 4"  (1.626 m)     Head Circumference --      Peak Flow --  Pain Score 06/15/21 0754 5     Pain Loc --      Pain Edu? --      Excl. in [place]? --      Physical Exam Vitals and nursing note reviewed.  Constitutional:      General: She is not in acute distress.    Appearance: She is well-developed.  HENT:     Head: Normocephalic and atraumatic.  Eyes:     Conjunctiva/sclera: Conjunctivae normal.  Cardiovascular:     Rate and Rhythm: Normal rate and regular rhythm.     Heart sounds: Normal heart sounds. No murmur heard.   No friction rub.  Pulmonary:     Effort: Pulmonary effort is normal. No respiratory distress.     Breath sounds: Normal breath sounds. No wheezing or rales.  Abdominal:     General: There is no distension.     Palpations: Abdomen is soft.     Tenderness: There is no abdominal tenderness.  Musculoskeletal:     Cervical back: Neck supple.  Skin:    General: Skin is warm.     Capillary Refill: Capillary refill takes less than 2 seconds.  Neurological:     Mental Status: She is alert and oriented to person, place, and time.     GCS: GCS eye subscore is 4. GCS verbal subscore is 5. GCS motor subscore is 6.     Cranial Nerves: Cranial nerves are intact.     Sensory: Sensation is intact.     Motor: Motor function is intact. No abnormal muscle tone.     Coordination: Coordination is intact.      ____________________________________________   LABS (all labs ordered are listed, but only abnormal results are displayed)  Labs Reviewed  BASIC METABOLIC PANEL - Abnormal; Notable  for the following components:      Result Value   Glucose, Bld 111 (*)    BUN 28 (*)    GFR, Estimated 55 (*)    All other components within normal limits  CBC - Abnormal; Notable for the following components:   RBC 3.81 (*)    HCT 35.6 (*)    All other components within normal limits  URINALYSIS, COMPLETE (UACMP) WITH MICROSCOPIC - Abnormal; Notable for the following components:   Color, Urine YELLOW (*)    APPearance HAZY (*)    Hgb urine dipstick MODERATE (*)    All other components within normal limits    ____________________________________________  EKG: Normal sinus rhythm with sinus arrhythmia, ventricular rate 76.  PR 126, QRS 80, QTc 477.  No acute ST elevations or depressions.  No EKG evidence of acute ischemia or infarct. ________________________________________  RADIOLOGY All imaging, including plain films, CT scans, and ultrasounds, independently reviewed by me, and interpretations confirmed via formal radiology reads.  ED MD interpretation:   CT head: No acute intracranial abnormality  Official radiology report(s): CT HEAD WO CONTRAST  Result Date: 06/15/2021 CLINICAL DATA:  Dizziness EXAM: CT HEAD WITHOUT CONTRAST TECHNIQUE: Contiguous axial images were obtained from the base of the skull through the vertex without intravenous contrast. COMPARISON:  January 2018 FINDINGS: Brain: There is no acute intracranial hemorrhage, mass effect, or edema. No new loss of gray-white differentiation. Left basal ganglia calcification with ex vacuo dilatation of the left frontal horn due to prior insult. Patchy areas of low-attenuation in the supratentorial white matter are nonspecific but probably reflects stable chronic microvascular ischemic changes. Prominence of the ventricles and sulci reflects similar generalized parenchymal volume loss.  Vascular: There is atherosclerotic calcification at the skull base. Skull: Calvarium is unremarkable. Sinuses/Orbits: No acute finding. Other:  None. IMPRESSION: No acute intracranial abnormality. Stable chronic/nonemergent findings detailed above. Electronically Signed   By: Macy Mis M.D.   On: 06/15/2021 08:50    ____________________________________________  PROCEDURES   Procedure(s) performed (including Critical Care):  Procedures  ____________________________________________  INITIAL IMPRESSION / MDM / [place] / ED COURSE  As part of my medical decision making, I reviewed the following data within the [facility] notes reviewed and incorporated, Old chart reviewed, Notes from prior ED visits, and [place] Controlled Substance Database       *Zelene Levitan was evaluated in Emergency Department on 06/15/2021 for the symptoms described in the history of present illness. She was evaluated in the context of the global COVID-19 pandemic, which necessitated consideration that the patient might be at risk for infection with the SARS-CoV-2 virus that causes COVID-19. Institutional protocols and algorithms that pertain to the evaluation of patients at risk for COVID-19 are in a state of rapid change based on information released by regulatory bodies including the CDC and federal and state organizations. These policies and algorithms were followed during the patient's care in the ED.  Some ED evaluations and interventions may be delayed as a result of limited staffing during the pandemic.*     Medical Decision Making:  87 yo  F here with dizziness, fall after bending forward. Pt has h/o similar issues in past. Suspect orthostasis vs loss of balance from bending forward. No focal neuro deficits to suggest cerebellar or other CNS etiology. CT head reviewed and is negative. Labs are reassuring. She is at her baseline here and ambulatory wo difficulty.  BMP does show elevated BUN to creatinine ratio and she admits to poor p.o. intake, so dehydration could play a component.  CBC unremarkable.  Urinalysis without  signs of UTI.  EKG nonischemic.  Patient given fluids and feels better.  Will advise her to continue hydrating at home, and encouraged on the importance of preventing bending forward or abrupt changes in positioning or standing.  Return precautions given.  ____________________________________________  FINAL CLINICAL IMPRESSION(S) / ED DIAGNOSES  Final diagnoses:  Dehydration  Dizziness     MEDICATIONS GIVEN DURING THIS VISIT:  Medications  sodium chloride 0.9 % bolus 1,000 mL (0 mLs Intravenous Stopped 06/15/21 1140)     ED Discharge Orders     None        Note:  This document was prepared using Dragon voice recognition software and may include unintentional dictation errors.   Duffy Bruce, MD 06/15/21 [phone]

## 2021-06-15 NOTE — ED Notes (Signed)
Pt ambulatory with a walker with standby assistance to the restroom.

## 2021-06-15 NOTE — ED Triage Notes (Signed)
Pt to ED [facility] from home for dizziness x2 days with fall. Denies hitting head or blood thinners C/o right hip pain, hx right hip fx, states chronic pain 20L FA EMS, 400 NS PTA Pt in NAD, alert and oriented.

## 2021-06-16 ENCOUNTER — Telehealth: Payer: Self-pay

## 2021-06-16 NOTE — Telephone Encounter (Signed)
Message Received: Skipper Cliche, MD  Pilar Grammes, CMA Please check on her and schedule ER follow up    Left message on VM for daughter to call and see how pt is doing and to schedule ER F/U

## 2021-06-21 ENCOUNTER — Other Ambulatory Visit: Payer: Self-pay

## 2021-06-21 ENCOUNTER — Ambulatory Visit (INDEPENDENT_AMBULATORY_CARE_PROVIDER_SITE_OTHER): Payer: Medicare Other | Admitting: Dermatology

## 2021-06-21 ENCOUNTER — Encounter: Payer: Self-pay | Admitting: Dermatology

## 2021-06-21 DIAGNOSIS — L820 Inflamed seborrheic keratosis: Secondary | ICD-10-CM

## 2021-06-21 DIAGNOSIS — L821 Other seborrheic keratosis: Secondary | ICD-10-CM | POA: Diagnosis not present

## 2021-06-21 DIAGNOSIS — L814 Other melanin hyperpigmentation: Secondary | ICD-10-CM | POA: Diagnosis not present

## 2021-06-21 NOTE — Patient Instructions (Signed)

## 2021-06-21 NOTE — Progress Notes (Signed)
   Follow-Up Visit   Subjective  Mallory Rodriguez is a 87 y.o. female who presents for the following: Spots (Patient presents today for itchy spots on the left lower leg and back. She has a history of BCC of the back treated many years ago.).    The following portions of the chart were reviewed this encounter and updated as appropriate:        Review of Systems:  No other skin or systemic complaints except as noted in HPI or Assessment and Plan.  Objective  Well appearing patient in no apparent distress; mood and affect are within normal limits.  A focused examination was performed including face, legs, back. Relevant physical exam findings are noted in the Assessment and Plan.  L lat upper back x 1, L lower leg Erythematous keratotic stuck-on papule on left lat back. Waxy tan pink macules on left lower leg x 2.   Assessment & Plan  Inflamed seborrheic keratosis L lat upper back x 1, L lower leg  Hx of itching.  LN2 to L lat upper back. Cryotherapy not recommended on left leg. Start CeraVe SA Cream to Aas - samples given.   Destruction of lesion - L lat upper back x 1, L lower leg  Destruction method: cryotherapy   Informed consent: discussed and consent obtained   Lesion destroyed using liquid nitrogen: Yes   Region frozen until ice ball extended beyond lesion: Yes   Outcome: patient tolerated procedure well with no complications   Post-procedure details: wound care instructions given   Additional details:  Prior to procedure, discussed risks of blister formation, small wound, skin dyspigmentation, or rare scar following cryotherapy. Recommend Vaseline ointment to treated areas while healing.  Seborrheic Keratoses - Stuck-on, waxy, tan-brown papules and/or plaques  - Benign-appearing - Discussed benign etiology and prognosis. - Observe - Call for any changes  Lentigines - Scattered tan macules - Due to sun exposure - Benign-appering, observe - Recommend daily broad  spectrum sunscreen SPF 30+ to sun-exposed areas, reapply every 2 hours as needed. - Call for any changes  Return if symptoms worsen or fail to improve.  IJamesetta Orleans, CMA, am acting as scribe for Brendolyn Patty, MD . Documentation: I have reviewed the above documentation for accuracy and completeness, and I agree with the above.  Brendolyn Patty MD

## 2021-08-24 ENCOUNTER — Other Ambulatory Visit: Payer: Self-pay

## 2021-08-24 ENCOUNTER — Encounter: Payer: Self-pay | Admitting: Nurse Practitioner

## 2021-08-24 ENCOUNTER — Ambulatory Visit (INDEPENDENT_AMBULATORY_CARE_PROVIDER_SITE_OTHER): Payer: Medicare Other | Admitting: Nurse Practitioner

## 2021-08-24 VITALS — BP 132/88 | HR 90 | Temp 97.7°F | Resp 16

## 2021-08-24 DIAGNOSIS — M109 Gout, unspecified: Secondary | ICD-10-CM | POA: Diagnosis not present

## 2021-08-24 MED ORDER — COLCHICINE 0.6 MG PO TABS: 0.6000 mg | ORAL_TABLET | Freq: Every day | ORAL | 0 refills | Status: DC

## 2021-08-24 NOTE — Progress Notes (Signed)
Acute Office Visit  Subjective:    Patient ID: Mallory Rodriguez, female    DOB: 1933/10/26, 87 y.o.   MRN: [id number]  Chief Complaint  Patient presents with   Foot Pain    Left foot, started x 3 days. Redness and some swelling near left big toe. Took Aleve yesterday. A little better today. No injury to the foot    HPI Patient is in today for  3 days ago she noticed it. States it has gotten worse since Has tried aleve at home with little relief. Denies injuring it or scraping the skin. Her daughter is present with her at the office visit.  She denies having history of gout, cellulitis or blood clots. Patient does take a thiazide diuretic.  Past Medical History:  Diagnosis Date   AP (angina pectoris) ([place])    Basal cell carcinoma    back   Claustrophobia    Diverticulosis of colon    Hiatal hernia    HLD (hyperlipidemia)    HTN (hypertension)    Hypogammaglobulinemia ([facility])    borderline   MVP (mitral valve prolapse)    and tricuspid leak   OA (osteoarthritis)    Pelvic kidney    lower left side   Pernicious anemia    Sciatica    TIA (transient ischemic attack) 08/2015    Past Surgical History:  Procedure Laterality Date   APPENDECTOMY     BASAL CELL CARCINOMA EXCISION     Back   BREAST BIOPSY Right    neg   BREAST LUMPECTOMY     Right (fatty tumor)   CATARACT EXTRACTION W/ INTRAOCULAR LENS  IMPLANT, BILATERAL Bilateral    CHOLECYSTECTOMY OPEN     DILATION AND CURETTAGE OF UTERUS     excision vestibular glands     Film removed Left eye     INGUINAL HERNIA REPAIR     Left side   INTRAMEDULLARY (IM) NAIL INTERTROCHANTERIC Right 01/14/2017   Procedure: ORIF RIGHT INTERTROCH NAIL;  Surgeon: Paralee Cancel, MD;  Location: [facility];  Service: Orthopedics;  Laterality: Right;   KNEE ARTHROSCOPY W/ PARTIAL MEDIAL MENISCECTOMY  08/2009   right   partial hip surgery     Right ovary and tube corpus     TONSILLECTOMY     TOTAL ABDOMINAL HYSTERECTOMY      Family History   Problem Relation Age of Onset   Heart attack Father 40       CABG   Hypertension Father    Dementia Mother    Cancer Maternal Aunt        Breast-multiple aunts and Gm   Cancer Maternal Uncle        Bladder and lung   Diabetes Paternal Grandmother    Hypertension Brother     Social History   Socioeconomic History   Marital status: Married    Spouse name: Not on file   Number of children: 2   Years of education: Not on file   Highest education level: Not on file  Occupational History   Occupation: Retired-bookkeeping/secretarial work    Comment: retired   Occupation:    Tobacco Use   Smoking status: Never   Smokeless tobacco: Never  Substance and Sexual Activity   Alcohol use: Yes    Alcohol/week: 3.0 standard drinks    Types: 3 Glasses of wine per week   Drug use: No   Sexual activity: Not on file  Other Topics Concern   Not on file  Social  History Narrative   Has living will and advance directives.    Requests daughter to make health care decisions.   Would accept resuscitation but no life prolonging technology if cognitively unaware   Social Determinants of Health   Financial Resource Strain: Not on file  Food Insecurity: Not on file  Transportation Needs: Not on file  Physical Activity: Not on file  Stress: Not on file  Social Connections: Not on file  Intimate Partner Violence: Not on file    Outpatient Medications Prior to Visit  Medication Sig Dispense Refill   acetaminophen (TYLENOL) 650 MG CR tablet Take 650 mg by mouth 3 (three) times daily.     aspirin EC 81 MG tablet Take 81 mg by mouth daily.     CALCIUM-VITAMIN D-VITAMIN K PO Take 1 each by mouth daily.     cyanocobalamin (,VITAMIN B-12,) 1000 MCG/ML injection Inject 1,000 mcg into the muscle every 3 (three) months.     EPINEPHrine (EPIPEN JR) 0.15 MG/0.3ML injection INJECT ONE SYRINGEFUL INTRAMUSCULARLY AS NEEDED FOR ANAPHYLAXIS 2 each 0   hydrochlorothiazide (HYDRODIURIL) 25 MG tablet Take 1  tablet (25 mg total) by mouth daily. 90 tablet 3   Omega-3 Fatty Acids (FISH OIL) 1200 MG CAPS Take 1 capsule by mouth daily.     Polyethyl Glycol-Propyl Glycol (SYSTANE OP) Apply 1 drop to eye as needed (for dry eyes).     polyethylene glycol (MIRALAX / GLYCOLAX) packet Take 17 g by mouth daily as needed for mild constipation. 14 each 0   Probiotic Product (ALIGN) 4 MG CAPS Take 1 capsule by mouth daily.     rosuvastatin (CRESTOR) 5 MG tablet Take 1 tablet (5 mg total) by mouth daily. 90 tablet 3   No facility-administered medications prior to visit.    Allergies  Allergen Reactions   Bee Venom Anaphylaxis   Contrast Media [Iodinated Diagnostic Agents] Hives   Lipitor [Atorvastatin] Swelling   Ace Inhibitors Swelling   Hydrocodone Other (See Comments)    Reaction:  Unknown    Macrodantin Other (See Comments)    Reaction:  Chest pain    Neosporin [Neomycin-Bacitracin Zn-Polymyx] Other (See Comments)    Reaction:  Burning    Sulfa Antibiotics Hives   Epinephrine Palpitations   Prednisone Rash and Other (See Comments)    Reaction:  Dizziness and headache     Review of Systems  Constitutional:  Negative for chills and fever.  Respiratory:  Negative for shortness of breath.   Cardiovascular:  Negative for chest pain and leg swelling.  Gastrointestinal:  Negative for diarrhea, nausea and vomiting.  Musculoskeletal:  Positive for arthralgias, gait problem and joint swelling.  Skin:  Positive for color change. Negative for pallor.      Objective:    Physical Exam Vitals and nursing note reviewed.  Constitutional:      Appearance: Normal appearance.  Cardiovascular:     Rate and Rhythm: Normal rate and regular rhythm.  Pulmonary:     Effort: Pulmonary effort is normal.     Breath sounds: Normal breath sounds.  Abdominal:     General: Bowel sounds are normal.  Musculoskeletal:        General: Swelling and tenderness present. No signs of injury.     Right lower leg: No  edema.     Left lower leg: No edema.       Legs:  Skin:    Findings: Erythema present. No abrasion, abscess, burn, ecchymosis, signs of injury, laceration, lesion or  wound.     Comments: See msk exam  Neurological:     Mental Status: She is alert.  Psychiatric:        Mood and Affect: Mood normal.        Behavior: Behavior normal.        Thought Content: Thought content normal.        Judgment: Judgment normal.    BP 132/88   Pulse 90   Temp 97.7 F (36.5 C)   Resp 16  Wt Readings from Last 3 Encounters:  06/15/21 170 lb (77.1 kg)  10/25/20 151 lb (68.5 kg)  08/05/20 130 lb (59 kg)    Health Maintenance Due  Topic Date Due   Zoster Vaccines- Shingrix (1 of 2) Never done   MAMMOGRAM  10/19/2018   COVID-19 Vaccine (4 - Booster for Pfizer series) 12/27/2020   INFLUENZA VACCINE  07/31/2021    There are no preventive care reminders to display for this patient.   Lab Results  Component Value Date   TSH 1.40 11/24/2013   Lab Results  Component Value Date   WBC 4.5 06/15/2021   HGB 12.1 06/15/2021   HCT 35.6 (L) 06/15/2021   MCV 93.4 06/15/2021   PLT 221 06/15/2021   Lab Results  Component Value Date   NA 138 06/15/2021   K 3.5 06/15/2021   CO2 26 06/15/2021   GLUCOSE 111 (H) 06/15/2021   BUN 28 (H) 06/15/2021   CREATININE 1.00 06/15/2021   BILITOT 0.6 06/10/2020   ALKPHOS 48 06/10/2020   AST 21 06/10/2020   ALT 14 06/10/2020   PROT 7.6 06/10/2020   ALBUMIN 4.6 06/10/2020   CALCIUM 9.0 06/15/2021   ANIONGAP 11 06/15/2021   GFR 54.00 (L) 06/10/2020   Lab Results  Component Value Date   CHOL 225 (H) 06/10/2020   Lab Results  Component Value Date   HDL 107.90 06/10/2020   Lab Results  Component Value Date   LDLCALC 102 (H) 06/10/2020   Lab Results  Component Value Date   TRIG 77.0 06/10/2020   Lab Results  Component Value Date   CHOLHDL 2 06/10/2020   No results found for: HGBA1C     Assessment & Plan:   Problem List Items Addressed  This Visit       Musculoskeletal and Integument   Acute gout involving toe of left foot - Primary    Patient has no history of gout.  She is on a thiazide diuretic.  States this happened 3 days ago with progression.  She cannot stand to touch or put pressure on the MTP joint.  Will drawl BM P and uric acid.  Patient has an allergy to prednisone so we will avoid use did write for some colchicine.  Pending patient's renal function we can look at indomethacin as needed.  Pending lab results. Start colchicine.  Did discuss with patient and daughter about adverse side effects of colchicine including GI upset.  Patient is also on Rubinas rosuvastatin, which can when taken with colchicine, increase myalgias did discuss this with patient and daughter.      Relevant Medications   colchicine 0.6 MG tablet   Other Relevant Orders   CBC   Uric acid   Basic metabolic panel     No orders of the defined types were placed in this encounter.  This visit occurred during the SARS-CoV-2 public health emergency.  Safety protocols were in place, including screening questions prior to the visit, additional usage of  staff PPE, and extensive cleaning of exam room while observing appropriate contact time as indicated for disinfecting solutions.   Romilda Garret, NP

## 2021-08-24 NOTE — Assessment & Plan Note (Signed)
Patient has no history of gout.  She is on a thiazide diuretic.  States this happened 3 days ago with progression.  She cannot stand to touch or put pressure on the MTP joint.  Will drawl BM P and uric acid.  Patient has an allergy to prednisone so we will avoid use did write for some colchicine.  Pending patient's renal function we can look at indomethacin as needed.  Pending lab results. Start colchicine.  Did discuss with patient and daughter about adverse side effects of colchicine including GI upset.  Patient is also on Rubinas rosuvastatin, which can when taken with colchicine, increase myalgias did discuss this with patient and daughter.

## 2021-08-24 NOTE — Patient Instructions (Addendum)
Will be in touch with lab results and further information about more medications

## 2021-08-25 ENCOUNTER — Telehealth: Payer: Self-pay | Admitting: Nurse Practitioner

## 2021-08-25 LAB — URIC ACID: Uric Acid, Serum: 8.1 mg/dL — ABNORMAL HIGH (ref 2.4–7.0)

## 2021-08-25 LAB — BASIC METABOLIC PANEL
BUN: 24 mg/dL — ABNORMAL HIGH (ref 6–23)
CO2: 26 mEq/L (ref 19–32)
Calcium: 9.6 mg/dL (ref 8.4–10.5)
Chloride: 101 mEq/L (ref 96–112)
Creatinine, Ser: 1.17 mg/dL (ref 0.40–1.20)
GFR: 42.49 mL/min — ABNORMAL LOW (ref >60.00)
Glucose, Bld: 83 mg/dL (ref 70–99)
Potassium: 3.7 mEq/L (ref 3.5–5.1)
Sodium: 138 mEq/L (ref 135–145)

## 2021-08-25 LAB — CBC
HCT: 34.4 % — ABNORMAL LOW (ref 36.0–46.0)
Hemoglobin: 11.3 g/dL — ABNORMAL LOW (ref 12.0–15.0)
MCHC: 32.9 g/dL (ref 30.0–36.0)
MCV: 93.4 fl (ref 78.0–100.0)
Platelets: 248 10*3/uL (ref 150.0–400.0)
RBC: 3.69 Mil/uL — ABNORMAL LOW (ref 3.87–5.11)
RDW: 13.6 % (ref 11.5–15.5)
WBC: 6.4 10*3/uL (ref 4.0–10.5)

## 2021-08-25 NOTE — Telephone Encounter (Signed)
error 

## 2021-08-28 ENCOUNTER — Telehealth: Payer: Self-pay | Admitting: *Deleted

## 2021-08-28 NOTE — Telephone Encounter (Signed)
Patient's daughter Mallory Rodriguez left a voicemail stating that her mom was seen last week for gout. Patient's daughter stated that the gout has improve but is not completely gone. Patient's daughter wants to know if patient can get a refill on the medication that was prescribed. Pharmacy [facility]

## 2021-08-29 NOTE — Telephone Encounter (Signed)
Mallory Rodriguez, daughter called, stating that redness and swelling of her foot is about 70% better. States patient still says pain is the same as it was when she was seen but daughter does not know if that is true because it looks better. Patient did say yesterday that she felt the pain was going up her leg some now too. No redness or swelling present anywhere else at this time. Patient had 2 episodes of diarrhea while taking Colchicine but no other symptoms.

## 2021-08-29 NOTE — Telephone Encounter (Signed)
Called again no answer  

## 2021-08-29 NOTE — Telephone Encounter (Signed)
Left message for [place], patient's daughter, to call back to ask questions Catalina Antigua had below.

## 2021-08-30 ENCOUNTER — Other Ambulatory Visit: Payer: Self-pay | Admitting: Nurse Practitioner

## 2021-08-30 DIAGNOSIS — M109 Gout, unspecified: Secondary | ICD-10-CM

## 2021-08-30 MED ORDER — COLCHICINE 0.6 MG PO TABS: 0.6000 mg | ORAL_TABLET | Freq: Every day | ORAL | 0 refills | Status: DC

## 2021-08-30 NOTE — Telephone Encounter (Signed)
[place], patient's daughter advised

## 2021-09-07 ENCOUNTER — Encounter: Payer: Self-pay | Admitting: Family Medicine

## 2021-09-07 ENCOUNTER — Ambulatory Visit (INDEPENDENT_AMBULATORY_CARE_PROVIDER_SITE_OTHER): Payer: Medicare Other | Admitting: Family Medicine

## 2021-09-07 ENCOUNTER — Other Ambulatory Visit: Payer: Self-pay

## 2021-09-07 VITALS — BP 90/60 | HR 56 | Temp 98.2°F | Ht 61.5 in

## 2021-09-07 DIAGNOSIS — M109 Gout, unspecified: Secondary | ICD-10-CM | POA: Diagnosis not present

## 2021-09-07 DIAGNOSIS — W19XXXD Unspecified fall, subsequent encounter: Secondary | ICD-10-CM | POA: Diagnosis not present

## 2021-09-07 DIAGNOSIS — M25552 Pain in left hip: Secondary | ICD-10-CM

## 2021-09-07 NOTE — Progress Notes (Signed)
Sanjith Siwek T. Reichen Hutzler, MD, [place] at [facility] [place], [address]  Phone: [phone]  FAX: [phone]  Mallory Rodriguez - 87 y.o. female  MRN [id number]  Date of Birth: 1933-11-16  Date: 09/07/2021  PCP: Venia Carbon, MD  Referral: Venia Carbon, MD  Chief Complaint  Patient presents with   Hip Pain    Left    This visit occurred during the SARS-CoV-2 public health emergency.  Safety protocols were in place, including screening questions prior to the visit, additional usage of staff PPE, and extensive cleaning of exam room while observing appropriate contact time as indicated for disinfecting solutions.   Subjective:   Mallory Rodriguez is a 87 y.o. very pleasant female patient with Body mass index is 31.6 kg/m. who presents with the following:  Left hip acute pain.  She comes into the office in a wheelchair.  She is here with her daughter.  She has been having some progressive hip pain laterally and as posteriorly over the last few months.  At baseline she does walk with a walker, and she is in a wheelchair currently.  2018 with a comminuted R femur fracture, ORIF by Dr. Alvan Dame. I reviewed this on the plain film, and this is a significant trauma there is still looks abnormal on x-ray with history of comminution.  She presents today with L hip pain.  She has having pain in the region of the ischial tuberosity, she is also having a lot of pain on the lateral aspect the hip as well as the posterior lateral aspect of the left hip.  She has having trouble sitting and as well as ambulating.  At baseline she does not ambulate all that well to begin with.  L 1st MTP joint is still here and bothering her and pain.  L buttocks area.  She has been treated for gout with colchicine recently, but this appears to be incompletely treated.  Not fully recovered from her extensive comminuted femur fracture from 3 years ago  Has  had some pain since she fell a few months ago.  Had a dizzy spell and slid of a chair and hit her head.  Got some fluids.  Sent home, and a few days later..  Ongoing gout flare. Colchicine x 2 rx.  Review of Systems is noted in the HPI, as appropriate  Objective:   BP 90/60   Pulse (!) 56   Temp 98.2 F (36.8 C) (Temporal)   Ht 5' 1.5" (1.562 m)   SpO2 95%   BMI 31.60 kg/m   GEN: No acute distress; alert,appropriate. PULM: Breathing comfortably in no respiratory distress PSYCH: Normally interactive.   Examined in a wheelchair. She has tender on the back from L2-S1 on both sides.  He also has tenderness at the lateral trochanteric bursa, she also has pain in the posterior buttocks region bilaterally and more on the left.  She also has pain at the ischial tuberosity and with movement at the pelvis with the exception of the sacrum and coccyx.  She is neurovascularly intact otherwise despite some quite significant weakness on both sides.  Left first metatarsal mildly swollen and pinkish in appearance compared to the contralateral side.  He is somewhat tender to palpation.  Laboratory and Imaging Data: CLINICAL DATA:  Low back pain after fall 1 week ago   EXAM: LUMBAR SPINE - COMPLETE 4+ VIEW   COMPARISON:  01/21/2020   FINDINGS: Five lumbar  type vertebral segments. Vertebral body heights and alignment are maintained. No fracture identified. Advanced multilevel intervertebral disc space loss with associated degenerative endplate changes. Prominent lower lumbar facet arthrosis. Diffuse osseous demineralization. Aortoiliac vascular calcifications.   IMPRESSION: Advanced multilevel lumbar spondylosis without acute fracture. No evidence of interval change from prior study.     Electronically Signed   By: Davina Poke D.O.   On: 08/05/2020 14:02     Assessment and Plan:     ICD-10-CM   1. Acute hip pain, left  M25.552 DG HIP UNILAT WITH PELVIS 2-3 VIEWS LEFT     2. Fall, subsequent encounter  W19.XXXD DG HIP UNILAT WITH PELVIS 2-3 VIEWS LEFT    3. Exacerbation of gout  M10.9      I got a plain film.  Unfortunately were not able to do this in our office secondary to acute damage with x-ray system failure.  I did have her go to outpatient imaging the following day.  Unfortunately, radiology has not read this image and it is now been going on the third day.  I independently reviewed these myself, and I do not find any evidence for acute fracture.  There are extensive degenerative changes and the right hip and femur have evidence of a massive trauma with diffuse bony changes that would be chronic at the femur, femoral neck, and it looks like she has had some gamma nail placement.  She also has multiple areas of calcific changes suspected to be calcific tendinitis.  Given the level pain the patient having on my independent review, think is probably reasonable for her to start some steroids now.  Whenever the formal read comes back from radiology if this differs from my own opinion then we will have to stop the patient's steroids and treat accordingly.  The patient, her daughter, and I also talked about potential risks and benefits of steroids.  In her chart it does note that she developed a rash as well as some dizziness while taking steroids before.  Rash from steroids would be so exceedingly uncommon without indication for major problems I think it is entirely reasonable to treat this patient with recalcitrant pain and recalcitrant gout with a round of steroids.  Her daughter does understand that if she does poorly then she will need to stop this immediately.  She also has some severe gout that is been recalcitrant with 2 rounds of colchicine.  Meds ordered this encounter  Medications   predniSONE (DELTASONE) 20 MG tablet    Sig: 2 tabs po daily for 5 days, then 1 tab po daily for 5 days    Dispense:  15 tablet    Refill:  0   There are no discontinued  medications. Orders Placed This Encounter  Procedures   DG HIP UNILAT WITH PELVIS 2-3 VIEWS LEFT    Dragon Medical One speech-to-text software was used for transcription in this dictation.  Possible transcriptional errors can occur using Editor, commissioning.   Signed,  Maud Deed. Raechell Singleton, MD   Outpatient Encounter Medications as of 09/07/2021  Medication Sig   acetaminophen (TYLENOL) 650 MG CR tablet Take 650 mg by mouth 3 (three) times daily.   aspirin EC 81 MG tablet Take 81 mg by mouth daily.   CALCIUM-VITAMIN D-VITAMIN K PO Take 1 each by mouth daily.   colchicine 0.6 MG tablet Take 1 tablet (0.6 mg total) by mouth daily. Daily   cyanocobalamin (,VITAMIN B-12,) 1000 MCG/ML injection Inject 1,000 mcg into the muscle  every 3 (three) months.   EPINEPHrine (EPIPEN JR) 0.15 MG/0.3ML injection INJECT ONE SYRINGEFUL INTRAMUSCULARLY AS NEEDED FOR ANAPHYLAXIS   hydrochlorothiazide (HYDRODIURIL) 25 MG tablet Take 1 tablet (25 mg total) by mouth daily.   Omega-3 Fatty Acids (FISH OIL) 1200 MG CAPS Take 1 capsule by mouth daily.   Polyethyl Glycol-Propyl Glycol (SYSTANE OP) Apply 1 drop to eye as needed (for dry eyes).   polyethylene glycol (MIRALAX / GLYCOLAX) packet Take 17 g by mouth daily as needed for mild constipation.   predniSONE (DELTASONE) 20 MG tablet 2 tabs po daily for 5 days, then 1 tab po daily for 5 days   Probiotic Product (ALIGN) 4 MG CAPS Take 1 capsule by mouth daily.   rosuvastatin (CRESTOR) 5 MG tablet Take 1 tablet (5 mg total) by mouth daily.   No facility-administered encounter medications on file as of 09/07/2021.

## 2021-09-08 ENCOUNTER — Ambulatory Visit
Admission: RE | Admit: 2021-09-08 | Discharge: 2021-09-08 | Disposition: A | Discharge status: Home / Self Care | Payer: Medicare Other | Source: Ambulatory Visit | Attending: Family Medicine | Admitting: Family Medicine

## 2021-09-08 ENCOUNTER — Ambulatory Visit
Admission: RE | Admit: 2021-09-08 | Discharge: 2021-09-08 | Disposition: A | Discharge status: Home / Self Care | Payer: Medicare Other | Attending: Family Medicine | Admitting: Family Medicine

## 2021-09-08 DIAGNOSIS — W01190A Fall on same level from slipping, tripping and stumbling with subsequent striking against furniture, initial encounter: Secondary | ICD-10-CM | POA: Diagnosis not present

## 2021-09-08 DIAGNOSIS — M25552 Pain in left hip: Secondary | ICD-10-CM

## 2021-09-08 DIAGNOSIS — M1611 Unilateral primary osteoarthritis, right hip: Secondary | ICD-10-CM | POA: Diagnosis not present

## 2021-09-08 DIAGNOSIS — W19XXXD Unspecified fall, subsequent encounter: Secondary | ICD-10-CM

## 2021-09-10 ENCOUNTER — Encounter: Payer: Self-pay | Admitting: Family Medicine

## 2021-09-10 MED ORDER — PREDNISONE 20 MG PO TABS: ORAL_TABLET | ORAL | 0 refills | Status: DC

## 2021-09-23 DIAGNOSIS — Z23 Encounter for immunization: Secondary | ICD-10-CM | POA: Diagnosis not present

## 2021-10-27 ENCOUNTER — Ambulatory Visit: Payer: Medicare Other | Admitting: Internal Medicine

## 2021-11-17 ENCOUNTER — Other Ambulatory Visit: Payer: Self-pay | Admitting: Internal Medicine

## 2021-12-06 ENCOUNTER — Other Ambulatory Visit: Payer: Self-pay

## 2021-12-06 ENCOUNTER — Ambulatory Visit (INDEPENDENT_AMBULATORY_CARE_PROVIDER_SITE_OTHER): Payer: Medicare Other | Admitting: Dermatology

## 2021-12-06 DIAGNOSIS — L650 Telogen effluvium: Secondary | ICD-10-CM

## 2021-12-06 DIAGNOSIS — L820 Inflamed seborrheic keratosis: Secondary | ICD-10-CM | POA: Diagnosis not present

## 2021-12-06 NOTE — Patient Instructions (Addendum)
Cryotherapy Aftercare  Wash gently with soap and water everyday.   Apply Vaseline and Band-Aid daily until healed.   Recommend minoxidil 5% (Rogaine for men) solution or foam to be applied to the scalp and left in. This should ideally be used twice daily for best results but it helps with hair regrowth when used at least three times per week. Rogaine initially can cause increased hair shedding for the first few weeks but this will stop with continued use. In studies, people who used minoxidil (Rogaine) for at least 6 months had thicker hair than people who did not. Minoxidil topical (Rogaine) only works as long as it continues to be used. If if it is no longer used then the hair it has been helping to regrow can fall out. Minoxidil topical (Rogaine) can cause increased facial hair growth which can usually be managed easily with a battery-operated hair trimmer. If facial hair growth is bothersome, switching to the 2% women's version can decrease the risk of unwanted facial hair growth.  Telogen effluvium is a benign, self limited condition causing increased hair shedding usually for several months. It does not progress to baldness, and the hair eventually grows back on its own. It can be triggered by recent illness, recent surgery, thyroid disease, low iron stores, vitamin D deficiency, fad diets or rapid weight loss, hormonal changes such as pregnancy or birth control pills, and some medication. Usually the hair loss starts 2-3 months after the illness or health change. Rarely, it can continue for longer than a year.   If You Need Anything After Your Visit  If you have any questions or concerns for your doctor, please call our main line at [phone] and press option 4 to reach your doctor's medical assistant. If no one answers, please leave a voicemail as directed and we will return your call as soon as possible. Messages left after 4 pm will be answered the following business day.   You may also  send [place] a message via [place]. We typically respond to MyChart messages within 1-2 business days.  For prescription refills, please ask your pharmacy to contact our office. Our fax number is [phone].  If you have an urgent issue when the clinic is closed that cannot wait until the next business day, you can page your doctor at the number below.    Please note that while we do our best to be available for urgent issues outside of office hours, we are not available 24/7.   If you have an urgent issue and are unable to reach [place], you may choose to seek medical care at your doctor's office, retail clinic, urgent care center, or emergency room.  If you have a medical emergency, please immediately call 911 or go to the emergency department.  Pager Numbers  - Dr. Nehemiah Massed: [phone]  - Dr. Laurence Ferrari: [phone]  - Dr. Nicole Kindred: [phone]  In the event of inclement weather, please call our main line at [phone] for an update on the status of any delays or closures.  Dermatology Medication Tips: Please keep the boxes that topical medications come in in order to help keep track of the instructions about where and how to use these. Pharmacies typically print the medication instructions only on the boxes and not directly on the medication tubes.   If your medication is too expensive, please contact our office at [phone] option 4 or send [place] a message through [place].   We are unable to tell what your co-pay for  medications will be in advance as this is different depending on your insurance coverage. However, we may be able to find a substitute medication at lower cost or fill out paperwork to get insurance to cover a needed medication.   If a prior authorization is required to get your medication covered by your insurance company, please allow [place] 1-2 business days to complete this process.  Drug prices often vary depending on where the prescription is filled and some pharmacies may  offer cheaper prices.  The website www.goodrx.com contains coupons for medications through different pharmacies. The prices here do not account for what the cost may be with help from insurance (it may be cheaper with your insurance), but the website can give you the price if you did not use any insurance.  - You can print the associated coupon and take it with your prescription to the pharmacy.  - You may also stop by our office during regular business hours and pick up a GoodRx coupon card.  - If you need your prescription sent electronically to a different pharmacy, notify our office through [facility] or by phone at [phone] option 4.     Si Usted Necesita Algo Despus de Su Visita  Tambin puede enviarnos un mensaje a travs de Pharmacist, community. Por lo general respondemos a los mensajes de MyChart en el transcurso de 1 a 2 das hbiles.  Para renovar recetas, por favor pida a su farmacia que se ponga en contacto con nuestra oficina. Harland Dingwall de fax es [place] [phone].  Si tiene un asunto urgente cuando la clnica est cerrada y que no puede esperar hasta el siguiente da hbil, puede llamar/localizar a su doctor(a) al nmero que aparece a continuacin.   Por favor, tenga en cuenta que aunque hacemos todo lo posible para estar disponibles para asuntos urgentes fuera del horario de [place], no estamos disponibles las 24 horas del da, los 7 das de la [place].   Si tiene un problema urgente y no puede comunicarse con nosotros, puede optar por buscar atencin mdica  en el consultorio de su doctor(a), en una clnica privada, en un centro de atencin urgente o en una sala de emergencias.  Si tiene Engineering geologist, por favor llame inmediatamente al 911 o vaya a la sala de emergencias.  Nmeros de bper  - Dr. Nehemiah Massed: [phone]  - Dra. Moye: [phone]  - Dra. Nicole Kindred: [phone]  En caso de inclemencias del [place], por favor llame a Johnsie Kindred principal al  [phone] para una actualizacin sobre el [place] de cualquier retraso o cierre.  Consejos para la medicacin en dermatologa: Por favor, guarde las cajas en las que vienen los medicamentos de uso tpico para ayudarle a seguir las instrucciones sobre dnde y cmo usarlos. Las farmacias generalmente imprimen las instrucciones del medicamento slo en las cajas y no directamente en los tubos del [place].   Si su medicamento es muy caro, por favor, pngase en contacto con Zigmund Daniel llamando al [phone] y presione la opcin 4 o envenos un mensaje a travs de Pharmacist, community.   No podemos decirle cul ser su copago por los medicamentos por adelantado ya que esto es diferente dependiendo de la cobertura de su seguro. Sin embargo, es posible que podamos encontrar un medicamento sustituto a Electrical engineer un formulario para que el seguro cubra el medicamento que se considera necesario.   Si se requiere una autorizacin previa para que su compaa de seguros [place] su medicamento, por favor permtanos  de 1 a 2 das hbiles para completar este proceso.  Los precios de los medicamentos varan con frecuencia dependiendo del Environmental consultant de dnde se surte la receta y alguna farmacias pueden ofrecer precios ms baratos.  El sitio web www.goodrx.com tiene cupones para medicamentos de Airline pilot. Los precios aqu no tienen en cuenta lo que podra costar con la ayuda del seguro (puede ser ms barato con su seguro), pero el sitio web puede darle el precio si no utiliz Research scientist (physical sciences).  - Puede imprimir el cupn correspondiente y llevarlo con su receta a la farmacia.  - Tambin puede pasar por nuestra oficina durante el horario de atencin regular y Charity fundraiser una tarjeta de cupones de GoodRx.  - Si necesita que su receta se enve electrnicamente a una farmacia diferente, informe a nuestra oficina a travs de MyChart de [place] o por telfono llamando al [phone] y presione la opcin 4.

## 2021-12-06 NOTE — Progress Notes (Signed)
Follow-Up Visit   Subjective  Mallory Rodriguez is a 87 y.o. female who presents for the following: Inflamed SK (L lateral upper back, still there after cryotherapy treatment in June 2022.). Spot is very itchy.   She also has hair loss that started recently- came out in handfuls, but seems to be slowing down now. She has gout of the toe, and was put on Prednisone about 2 months ago. Patient has an allergy to Prednisone.  No other illnesses or surgeries.   The following portions of the chart were reviewed this encounter and updated as appropriate:       Review of Systems:  No other skin or systemic complaints except as noted in HPI or Assessment and Plan.  Objective  Well appearing patient in no apparent distress; mood and affect are within normal limits.  A focused examination was performed including back, scalp. Relevant physical exam findings are noted in the Assessment and Plan.  L lateral mid back Erythematous keratotic or waxy stuck-on papule or plaque.   Scalp Diffuse hair thinning of the scalp.    Assessment & Plan  Inflamed seborrheic keratosis L lateral mid back  Residual  Destruction of lesion - L lateral mid back  Destruction method: cryotherapy   Informed consent: discussed and consent obtained   Lesion destroyed using liquid nitrogen: Yes   Region frozen until ice ball extended beyond lesion: Yes   Outcome: patient tolerated procedure well with no complications   Post-procedure details: wound care instructions given   Additional details:  Prior to procedure, discussed risks of blister formation, small wound, skin dyspigmentation, or rare scar following cryotherapy. Recommend Vaseline ointment to treated areas while healing.   Telogen effluvium Scalp  Secondary to gout of the toe and Prednisone treatment. Patient has allergy to Prednisone.   Telogen effluvium is a benign, self limited condition causing increased hair shedding usually for several months. It  does not progress to baldness, and the hair eventually grows back on its own. It can be triggered by recent illness, recent surgery, thyroid disease, low iron stores, vitamin D deficiency, fad diets or rapid weight loss, hormonal changes such as pregnancy or birth control pills, and some medication. Usually the hair loss starts 2-3 months after the illness or health change. Rarely, it can continue for longer than a year.  Discussed having labs ordered. Since patient feels her hair may be improving some, she may wait. If not improving, will have labs done. Orders for TSH and Ferritin given to pt.  Also ordered Vit D, but not covered by insurance, so removed order.  Recommend minoxidil 5% (Rogaine for men) solution or foam to be applied to the scalp and left in. This should ideally be used twice daily for best results but it helps with hair regrowth when used at least three times per week. Rogaine initially can cause increased hair shedding for the first few weeks but this will stop with continued use. In studies, people who used minoxidil (Rogaine) for at least 6 months had thicker hair than people who did not. Minoxidil topical (Rogaine) only works as long as it continues to be used. If if it is no longer used then the hair it has been helping to regrow can fall out. Minoxidil topical (Rogaine) can cause increased facial hair growth which can usually be managed easily with a battery-operated hair trimmer. If facial hair growth is bothersome, switching to the 2% women's version can decrease the risk of unwanted facial hair growth.  TSH - Scalp  Ferritin - Scalp  Return if symptoms worsen or fail to improve.  IJamesetta Orleans, CMA, am acting as scribe for Brendolyn Patty, MD . Documentation: I have reviewed the above documentation for accuracy and completeness, and I agree with the above.  Brendolyn Patty MD

## 2022-01-03 ENCOUNTER — Emergency Department
Admission: EM | Admit: 2022-01-03 | Discharge: 2022-01-03 | Disposition: A | Discharge status: Home / Self Care | Payer: Medicare Other | Attending: Emergency Medicine | Admitting: Emergency Medicine

## 2022-01-03 ENCOUNTER — Other Ambulatory Visit: Payer: Self-pay

## 2022-01-03 DIAGNOSIS — R42 Dizziness and giddiness: Secondary | ICD-10-CM | POA: Diagnosis not present

## 2022-01-03 DIAGNOSIS — I10 Essential (primary) hypertension: Secondary | ICD-10-CM | POA: Diagnosis not present

## 2022-01-03 DIAGNOSIS — Z743 Need for continuous supervision: Secondary | ICD-10-CM | POA: Diagnosis not present

## 2022-01-03 DIAGNOSIS — H81399 Other peripheral vertigo, unspecified ear: Secondary | ICD-10-CM | POA: Insufficient documentation

## 2022-01-03 LAB — BASIC METABOLIC PANEL
Anion gap: 9 (ref 5–15)
BUN: 22 mg/dL (ref 8–23)
CO2: 29 mmol/L (ref 22–32)
Calcium: 9.4 mg/dL (ref 8.9–10.3)
Chloride: 102 mmol/L (ref 98–111)
Creatinine, Ser: 1.08 mg/dL — ABNORMAL HIGH (ref 0.44–1.00)
GFR, Estimated: 50 mL/min — ABNORMAL LOW (ref >60)
Glucose, Bld: 107 mg/dL — ABNORMAL HIGH (ref 70–99)
Potassium: 3.6 mmol/L (ref 3.5–5.1)
Sodium: 140 mmol/L (ref 135–145)

## 2022-01-03 LAB — CBC
HCT: 37.8 % (ref 36.0–46.0)
Hemoglobin: 12.4 g/dL (ref 12.0–15.0)
MCH: 30.4 pg (ref 26.0–34.0)
MCHC: 32.8 g/dL (ref 30.0–36.0)
MCV: 92.6 fL (ref 80.0–100.0)
Platelets: 239 10*3/uL (ref 150–400)
RBC: 4.08 MIL/uL (ref 3.87–5.11)
RDW: 14.6 % (ref 11.5–15.5)
WBC: 4.7 10*3/uL (ref 4.0–10.5)
nRBC: 0 % (ref 0.0–0.2)

## 2022-01-03 MED ORDER — LACTATED RINGERS IV BOLUS
1000.0000 mL | Freq: Once | INTRAVENOUS | Status: AC
Start: 2022-01-03 — End: 2022-01-03
  Administered 2022-01-03: 1000 mL via INTRAVENOUS

## 2022-01-03 MED ORDER — MECLIZINE HCL 25 MG PO TABS
25.0000 mg | ORAL_TABLET | Freq: Once | ORAL | Status: AC
  Administered 2022-01-03: 25 mg via ORAL
  Filled 2022-01-03: qty 1

## 2022-01-03 MED ORDER — MECLIZINE HCL 25 MG PO TABS: 25.0000 mg | ORAL_TABLET | Freq: Three times a day (TID) | ORAL | 0 refills | Status: DC | PRN

## 2022-01-03 NOTE — ED Triage Notes (Signed)
Pt comes into the ED via EMS from home, dizziness for the past week, had an episode of this before and thinks it may be vertigo. A/ox4, lives with daughter  [id number] HR80 98%RA 130/90

## 2022-01-03 NOTE — ED Provider Notes (Signed)
[facility] Provider Note    Event Date/Time   First MD Initiated Contact with Patient 01/03/22 1237     (approximate)   History   Chief Complaint Dizziness   HPI  Mallory Rodriguez is a 88 y.o. female with past medical history of hypertension, hyperlipidemia, pernicious anemia, gout, and stroke who presents to the ED complaining of dizziness.  Patient reports that for the past week she has been feeling very dizzy with any movement.  She states that when she lifts her head up or goes to try and walk she feels like the room is spinning around her and like she might pass out.  She denies any associated chest pain or shortness of breath, has not lost consciousness at any point.  She denies any vision changes, speech changes, numbness, or weakness.  She denies any history of similar symptoms, specifically denies any history of vertigo.  She has not taken anything for her symptoms prior to arrival.     Physical Exam   Triage Vital Signs: ED Triage Vitals [01/03/22 0756]  Enc Vitals Group     BP (!) 148/134     Pulse Rate 87     Resp (!) 22     Temp 98.1 F (36.7 C)     Temp Source Oral     SpO2 94 %     Weight      Height      Head Circumference      Peak Flow      Pain Score      Pain Loc      Pain Edu?      Excl. in [place]?     Most recent vital signs: Vitals:   01/03/22 1242 01/03/22 1252  BP: (!) 146/121 (!) 159/70  Pulse: 79 80  Resp: 18   Temp:  97.9 F (36.6 C)  SpO2: 98% 100%    Constitutional: Alert and oriented. Eyes: Conjunctivae are normal.  Pupils equal, round, and reactive to light bilaterally. Head: Atraumatic. Nose: No congestion/rhinnorhea. Mouth/Throat: Mucous membranes are moist.  Cardiovascular: Normal rate, regular rhythm. Grossly normal heart sounds.  2+ radial pulses bilaterally. Respiratory: Normal respiratory effort.  No retractions. Lungs CTAB. Gastrointestinal: Soft and nontender. No distention. Genitourinary:  Deferred. Musculoskeletal: No lower extremity tenderness nor edema.  Neurologic:  Normal speech and language. No gross focal neurologic deficits are appreciated.    ED Results / Procedures / Treatments   Labs (all labs ordered are listed, but only abnormal results are displayed) Labs Reviewed  BASIC METABOLIC PANEL - Abnormal; Notable for the following components:      Result Value   Glucose, Bld 107 (*)    Creatinine, Ser 1.08 (*)    GFR, Estimated 50 (*)    All other components within normal limits  CBC  URINALYSIS, ROUTINE W REFLEX MICROSCOPIC  CBG MONITORING, ED     EKG  ED ECG REPORT I, Blake Divine, the attending physician, personally viewed and interpreted this ECG.   Date: 01/03/2022  EKG Time: 8:01  Rate: 84  Rhythm: normal sinus rhythm, occasional PVC noted, unifocal  Axis: Normal  Intervals:none  ST&T Change: None   PROCEDURES:  Critical Care performed: No  .1-3 Lead EKG Interpretation Performed by: Blake Divine, MD Authorized by: Blake Divine, MD     Interpretation: normal     ECG rate:  65-80   ECG rate assessment: normal     Rhythm: sinus rhythm     Ectopy:  none     Conduction: normal     MEDICATIONS ORDERED IN ED: Medications  meclizine (ANTIVERT) tablet 25 mg (25 mg Oral Given 01/03/22 1330)  lactated ringers bolus 1,000 mL (1,000 mLs Intravenous New Bag/Given 01/03/22 1329)     IMPRESSION / MDM / ASSESSMENT AND PLAN / ED COURSE  I reviewed the triage vital signs and the nursing notes.                              88 y.o. female with past medical history of hypertension, hyperlipidemia, pernicious anemia, and gout who presents to the ED for dizziness and sensation of room spinning around her as well as feeling like she is going to pass out when she goes to stand up and move.  Differential diagnosis includes, but is not limited to, stroke, peripheral vertigo, dehydration, electrolyte abnormality, arrhythmia, anemia, orthostatic  hypotension.  Patient is well-appearing with no focal neurologic deficits on exam.  Given symptoms are exacerbated with movement, suspect peripheral vertigo or orthostatic hypotension.  EKG shows occasional PVCs but otherwise no arrhythmia no ischemic changes, low suspicion for cardiac etiology.  Orthostatic vital signs were obtained and unremarkable, low suspicion for dehydration.  We will treat with meclizine and reassess.  Patient is feeling better following IV fluids and meclizine, is now able to ambulate with the assistance of a walker with minimal dizziness.  I spoke with patient's daughter over the phone and we will plan for discharge home with PCP follow-up.  Patient to be prescribed meclizine for use as needed and daughter counseled to have her return to the ED for new worsening symptoms.  Patient and daughter agree with plan.  The patient is on the cardiac monitor to evaluate for evidence of arrhythmia and/or significant heart rate changes.       FINAL CLINICAL IMPRESSION(S) / ED DIAGNOSES   Final diagnoses:  Dizziness  Peripheral vertigo, unspecified laterality     Rx / DC Orders   ED Discharge Orders          Ordered    meclizine (ANTIVERT) 25 MG tablet  3 times daily PRN        01/03/22 1521             Note:  This document was prepared using Dragon voice recognition software and may include unintentional dictation errors.   Blake Divine, MD 01/03/22 [phone]

## 2022-01-03 NOTE — ED Notes (Signed)
ED Provider at bedside. 

## 2022-01-03 NOTE — ED Notes (Signed)
Pt placed on external cath

## 2022-01-12 ENCOUNTER — Telehealth: Payer: Self-pay

## 2022-01-12 NOTE — Telephone Encounter (Signed)
Spoke to daughter. She is doing better. Tking medication. Does have appt here 1-18

## 2022-01-17 ENCOUNTER — Ambulatory Visit (INDEPENDENT_AMBULATORY_CARE_PROVIDER_SITE_OTHER): Payer: Medicare Other | Admitting: Internal Medicine

## 2022-01-17 ENCOUNTER — Encounter: Payer: Self-pay | Admitting: Internal Medicine

## 2022-01-17 ENCOUNTER — Other Ambulatory Visit: Payer: Self-pay

## 2022-01-17 DIAGNOSIS — R42 Dizziness and giddiness: Secondary | ICD-10-CM | POA: Insufficient documentation

## 2022-01-17 MED ORDER — MECLIZINE HCL 25 MG PO TABS: 25.0000 mg | ORAL_TABLET | Freq: Three times a day (TID) | ORAL | 1 refills | Status: DC | PRN

## 2022-01-17 NOTE — Assessment & Plan Note (Signed)
Seems to be vestibular Nothing to suggest stroke Will refill the meclizine---if not improving in the next week or so, will set up with [facility]NT

## 2022-01-17 NOTE — Progress Notes (Signed)
Subjective:    Patient ID: Mallory Rodriguez, female    DOB: 11-May-1933, 88 y.o.   MRN: [id number]  HPI Here with daughter for ER visit  Woke on 1/4--and "couldn't move" Felt like she would pass out--had rotatory vertigo Daughter called rescue (they live with her) Reviewed ER visit---blood work Pensions consultant IV fluids and meclizine---and improved enough to leave  Has been using the meclizine daily---it does help Not gone though  Current Outpatient Medications on File Prior to Visit  Medication Sig Dispense Refill   acetaminophen (TYLENOL) 650 MG CR tablet Take 650 mg by mouth 3 (three) times daily.     aspirin EC 81 MG tablet Take 81 mg by mouth daily.     CALCIUM-VITAMIN D-VITAMIN K PO Take 1 each by mouth daily.     cyanocobalamin (,VITAMIN B-12,) 1000 MCG/ML injection Inject 1,000 mcg into the muscle every 3 (three) months.     EPINEPHrine (EPIPEN JR) 0.15 MG/0.3ML injection INJECT ONE SYRINGEFUL INTRAMUSCULARLY AS NEEDED FOR ANAPHYLAXIS 2 each 0   hydrochlorothiazide (HYDRODIURIL) 25 MG tablet Take 1 tablet (25 mg total) by mouth daily. 90 tablet 3   meclizine (ANTIVERT) 25 MG tablet Take 1 tablet (25 mg total) by mouth 3 (three) times daily as needed for dizziness. 30 tablet 0   Omega-3 Fatty Acids (FISH OIL) 1200 MG CAPS Take 1 capsule by mouth daily.     Polyethyl Glycol-Propyl Glycol (SYSTANE OP) Apply 1 drop to eye as needed (for dry eyes).     polyethylene glycol (MIRALAX / GLYCOLAX) packet Take 17 g by mouth daily as needed for mild constipation. 14 each 0   Probiotic Product (ALIGN) 4 MG CAPS Take 1 capsule by mouth daily.     rosuvastatin (CRESTOR) 5 MG tablet TAKE 1 TABLET BY MOUTH EVERY DAY 90 tablet 0   No current facility-administered medications on file prior to visit.    Allergies  Allergen Reactions   Bee Venom Anaphylaxis   Contrast Media [Iodinated Contrast Media] Hives   Lipitor [Atorvastatin] Swelling   Ace Inhibitors Swelling   Hydrocodone Other (See Comments)     Reaction:  Unknown    Macrodantin Other (See Comments)    Reaction:  Chest pain    Neosporin [Neomycin-Bacitracin Zn-Polymyx] Other (See Comments)    Reaction:  Burning    Sulfa Antibiotics Hives   Epinephrine Palpitations   Prednisone Rash and Other (See Comments)    Reaction:  Dizziness and headache     Past Medical History:  Diagnosis Date   AP (angina pectoris) (HCC)    Basal cell carcinoma    back   Claustrophobia    Diverticulosis of colon    Hiatal hernia    HLD (hyperlipidemia)    HTN (hypertension)    Hypogammaglobulinemia (HCC)    borderline   MVP (mitral valve prolapse)    and tricuspid leak   OA (osteoarthritis)    Pelvic kidney    lower left side   Pernicious anemia    Sciatica    TIA (transient ischemic attack) 08/2015    Past Surgical History:  Procedure Laterality Date   APPENDECTOMY     BASAL CELL CARCINOMA EXCISION     Back   BREAST BIOPSY Right    neg   BREAST LUMPECTOMY     Right (fatty tumor)   CATARACT EXTRACTION W/ INTRAOCULAR LENS  IMPLANT, BILATERAL Bilateral    CHOLECYSTECTOMY OPEN     DILATION AND CURETTAGE OF UTERUS     excision  vestibular glands     Film removed Left eye     INGUINAL HERNIA REPAIR     Left side   INTRAMEDULLARY (IM) NAIL INTERTROCHANTERIC Right 01/14/2017   Procedure: ORIF RIGHT INTERTROCH NAIL;  Surgeon: Paralee Cancel, MD;  Location: [facility];  Service: Orthopedics;  Laterality: Right;   KNEE ARTHROSCOPY W/ PARTIAL MEDIAL MENISCECTOMY  08/2009   right   partial hip surgery     Right ovary and tube corpus     TONSILLECTOMY     TOTAL ABDOMINAL HYSTERECTOMY      Family History  Problem Relation Age of Onset   Heart attack Father 15       CABG   Hypertension Father    Dementia Mother    Cancer Maternal Aunt        Breast-multiple aunts and Gm   Cancer Maternal Uncle        Bladder and lung   Diabetes Paternal Grandmother    Hypertension Brother     Social History   Socioeconomic History   Marital status:  Married    Spouse name: Not on file   Number of children: 2   Years of education: Not on file   Highest education level: Not on file  Occupational History   Occupation: Retired-bookkeeping/secretarial work    Comment: retired   Occupation:    Tobacco Use   Smoking status: Never   Smokeless tobacco: Never  Substance and Sexual Activity   Alcohol use: Yes    Alcohol/week: 3.0 standard drinks    Types: 3 Glasses of wine per week   Drug use: No   Sexual activity: Not on file  Other Topics Concern   Not on file  Social History Narrative   Has living will and advance directives.    Requests daughter to make health care decisions.   Would accept resuscitation but no life prolonging technology if cognitively unaware   Social Determinants of Health   Financial Resource Strain: Not on file  Food Insecurity: Not on file  Transportation Needs: Not on file  Physical Activity: Not on file  Stress: Not on file  Social Connections: Not on file  Intimate Partner Violence: Not on file   Review of Systems No focal weakness No facial droop or aphasia Eating fair Has chronic tinnitus--no change Has had decline in hearing No headache No recent travel    Objective:   Physical Exam Constitutional:      Appearance: Normal appearance.  Neurological:     General: No focal deficit present.     Mental Status: She is alert.     Comments: Mild generalized weakness Gait is not ataxic--does pull a little to the left Romberg absent            Assessment & Plan:

## 2022-02-12 ENCOUNTER — Other Ambulatory Visit: Payer: Self-pay | Admitting: Internal Medicine

## 2022-03-06 ENCOUNTER — Other Ambulatory Visit: Payer: Self-pay | Admitting: Internal Medicine

## 2022-03-27 ENCOUNTER — Other Ambulatory Visit: Payer: Self-pay | Admitting: Internal Medicine

## 2022-04-23 ENCOUNTER — Encounter: Payer: Self-pay | Admitting: Internal Medicine

## 2022-04-23 ENCOUNTER — Ambulatory Visit (INDEPENDENT_AMBULATORY_CARE_PROVIDER_SITE_OTHER): Payer: Medicare Other | Admitting: Internal Medicine

## 2022-04-23 VITALS — BP 122/72 | HR 80 | Temp 98.6°F | Ht 61.0 in | Wt 142.0 lb

## 2022-04-23 DIAGNOSIS — Z7189 Other specified counseling: Secondary | ICD-10-CM | POA: Diagnosis not present

## 2022-04-23 DIAGNOSIS — I69959 Hemiplegia and hemiparesis following unspecified cerebrovascular disease affecting unspecified side: Secondary | ICD-10-CM | POA: Diagnosis not present

## 2022-04-23 DIAGNOSIS — Z0000 Encounter for general adult medical examination without abnormal findings: Secondary | ICD-10-CM

## 2022-04-23 DIAGNOSIS — I10 Essential (primary) hypertension: Secondary | ICD-10-CM | POA: Diagnosis not present

## 2022-04-23 DIAGNOSIS — M1000 Idiopathic gout, unspecified site: Secondary | ICD-10-CM | POA: Diagnosis not present

## 2022-04-23 DIAGNOSIS — N1831 Chronic kidney disease, stage 3a: Secondary | ICD-10-CM | POA: Diagnosis not present

## 2022-04-23 MED ORDER — HYDROCHLOROTHIAZIDE 25 MG PO TABS: 25.0000 mg | ORAL_TABLET | Freq: Every day | ORAL | 3 refills | Status: DC

## 2022-04-23 MED ORDER — ROSUVASTATIN CALCIUM 5 MG PO TABS: 5.0000 mg | ORAL_TABLET | Freq: Every day | ORAL | 3 refills | Status: DC

## 2022-04-23 NOTE — Assessment & Plan Note (Signed)
See social history 

## 2022-04-23 NOTE — Assessment & Plan Note (Addendum)
BP Readings from Last 3 Encounters:  ?04/23/22 122/72  ?01/17/22 110/70  ?01/03/22 (!) 159/70  ? ?Doing well on HCTZ 25 ?If gout recurs, will change to amlodipine or ARB ?

## 2022-04-23 NOTE — Assessment & Plan Note (Signed)
Mild and given her age, won't change meds to ACEI or ARB at this point ?(may be an option if we need to change the HCTZ due to gout recurrence) ?

## 2022-04-23 NOTE — Progress Notes (Signed)
Vision Screening  ? Right eye Left eye Both eyes  ?Without correction     ?With correction '20/20 20/20 20/15 '$  ?Hearing Screening - Comments:: Has a hearing aids. Not wearing it today ? ?

## 2022-04-23 NOTE — Assessment & Plan Note (Signed)
I have personally reviewed the Medicare Annual Wellness questionnaire and have noted ?1. The patient's medical and social history ?2. Their use of alcohol, tobacco or illicit drugs ?3. Their current medications and supplements ?4. The patient's functional ability including ADL's, fall risks, home safety risks and hearing or visual ?            impairment. ?5. Diet and physical activities ?6. Evidence for depression or mood disorders ? ?The patients weight, height, BMI and visual acuity have been recorded in the chart ?I have made referrals, counseling and provided education to the patient based review of the above and I have provided the pt with a written personalized care plan for preventive services. ? ?I have provided you with a copy of your personalized plan for preventive services. Please take the time to review along with your updated medication list. ? ?No cancer screening due to age ?Not doing any exercise---discussed resistance ?COVID bivalent vaccine and flu vaccine in the fall ?Consider shingrix at the pharmacy ?

## 2022-04-23 NOTE — Assessment & Plan Note (Signed)
Mild weakness ?Is on ASA '81mg'$  and rosuvastatin '5mg'$  daily ?

## 2022-04-23 NOTE — Assessment & Plan Note (Signed)
Colchicine not really effective---but prednisone helped ? ?

## 2022-04-23 NOTE — Progress Notes (Addendum)
? ?Subjective:  ? ? Patient ID: Mallory Rodriguez, female    DOB: [birth year], 88 y.o.   MRN: [id number] ? ?HPI ?Here with daughter Kloe for Medicare wellness visit and follow up of chronic health conditions ?Reviewed advanced directives ?Reviewed other doctors--Dr Herbert Deaner dentist, Dr Anthonette Legato ?No hospitalizations or surgery in the past year ?Vision is fine ?Hearing is not great---from left ear ?No tobacco ?No alcohol now ?No falls ?No depression or anhedonia ?Mild memory issues---nothing worrisome ? ?Still with mild weakness on right side ?Walks with rollator ?Daughter does most of the housework---she does do dishes and laundry for her and husband ?Independent with ADLs ?Remains continent---other than a pad for mild urge incontinence ?Continues on the ASA ---since stroke ~5 years ago ?Has tolerated the rosuvastatin ? ?No chest pain or SOB ?Does get some "buzzing" in her ears--but not as often ?No recurrence of vertigo ?No syncope ?No palpitations ?No edema--chronic slight swelling on right since fracture ? ?No more gout attacks ? ?Recent GFR 50 ? ?Current Outpatient Medications on File Prior to Visit  ?Medication Sig Dispense Refill  ? acetaminophen (TYLENOL) 650 MG CR tablet Take 650 mg by mouth 3 (three) times daily.    ? aspirin EC 81 MG tablet Take 81 mg by mouth daily.    ? CALCIUM-VITAMIN D-VITAMIN K PO Take 1 each by mouth daily.    ? EPINEPHrine (EPIPEN JR) 0.15 MG/0.3ML injection INJECT ONE SYRINGEFUL INTRAMUSCULARLY AS NEEDED FOR ANAPHYLAXIS 2 each 0  ? hydrochlorothiazide (HYDRODIURIL) 25 MG tablet TAKE 1 TABLET (25 MG TOTAL) BY MOUTH DAILY. 90 tablet 0  ? Multiple Vitamins-Minerals (CENTRUM SILVER PO) Take by mouth.    ? Polyethyl Glycol-Propyl Glycol (SYSTANE OP) Apply 1 drop to eye as needed (for dry eyes).    ? polyethylene glycol (MIRALAX / GLYCOLAX) packet Take 17 g by mouth daily as needed for mild constipation. 14 each 0  ? Probiotic Product (ALIGN) 4 MG CAPS Take 1 capsule  by mouth daily.    ? rosuvastatin (CRESTOR) 5 MG tablet TAKE 1 TABLET BY MOUTH EVERY DAY 90 tablet 0  ? ?No current facility-administered medications on file prior to visit.  ? ? ?Allergies  ?Allergen Reactions  ? Bee Venom Anaphylaxis  ? Contrast Media [Iodinated Contrast Media] Hives  ? Lipitor [Atorvastatin] Swelling  ? Ace Inhibitors Swelling  ? Hydrocodone Other (See Comments)  ?  Reaction:  Unknown   ? Macrodantin Other (See Comments)  ?  Reaction:  Chest pain   ? Neosporin [Neomycin-Bacitracin Zn-Polymyx] Other (See Comments)  ?  Reaction:  Burning   ? Sulfa Antibiotics Hives  ? Epinephrine Palpitations  ? Prednisone Rash and Other (See Comments)  ?  Reaction:  Dizziness and headache   ? ? ?Past Medical History:  ?Diagnosis Date  ? AP (angina pectoris) ([place])   ? Basal cell carcinoma   ? back  ? Claustrophobia   ? Diverticulosis of colon   ? Hiatal hernia   ? HLD (hyperlipidemia)   ? HTN (hypertension)   ? Hypogammaglobulinemia ([place])   ? borderline  ? MVP (mitral valve prolapse)   ? and tricuspid leak  ? OA (osteoarthritis)   ? Pelvic kidney   ? lower left side  ? Pernicious anemia   ? Sciatica   ? TIA (transient ischemic attack) 08/2015  ? ? ?Past Surgical History:  ?Procedure Laterality Date  ? APPENDECTOMY    ? BASAL CELL CARCINOMA EXCISION    ? Back  ? BREAST BIOPSY  Right   ? neg  ? BREAST LUMPECTOMY    ? Right (fatty tumor)  ? CATARACT EXTRACTION W/ INTRAOCULAR LENS  IMPLANT, BILATERAL Bilateral   ? CHOLECYSTECTOMY OPEN    ? DILATION AND CURETTAGE OF UTERUS    ? excision vestibular glands    ? Film removed Left eye    ? INGUINAL HERNIA REPAIR    ? Left side  ? INTRAMEDULLARY (IM) NAIL INTERTROCHANTERIC Right 01/14/2017  ? Procedure: ORIF RIGHT INTERTROCH NAIL;  Surgeon: Paralee Cancel, MD;  Location: [facility];  Service: Orthopedics;  Laterality: Right;  ? KNEE ARTHROSCOPY W/ PARTIAL MEDIAL MENISCECTOMY  08/2009  ? right  ? partial hip surgery    ? Right ovary and tube corpus    ? TONSILLECTOMY    ? TOTAL  ABDOMINAL HYSTERECTOMY    ? ? ?Family History  ?Problem Relation Age of Onset  ? Heart attack Father 89  ?     CABG  ? Hypertension Father   ? Dementia Mother   ? Cancer Maternal Aunt   ?     Breast-multiple aunts and Gm  ? Cancer Maternal Uncle   ?     Bladder and lung  ? Diabetes Paternal Grandmother   ? Hypertension Brother   ? ? ?Social History  ? ?Socioeconomic History  ? Marital status: Married  ?  Spouse name: Not on file  ? Number of children: 2  ? Years of education: Not on file  ? Highest education level: Not on file  ?Occupational History  ? Occupation: Retired-bookkeeping/secretarial work  ?  Comment: retired  ? Occupation:    ?Tobacco Use  ? Smoking status: Never  ?  Passive exposure: Past  ? Smokeless tobacco: Never  ?Substance and Sexual Activity  ? Alcohol use: Yes  ?  Alcohol/week: 3.0 standard drinks  ?  Types: 3 Glasses of wine per week  ? Drug use: No  ? Sexual activity: Not on file  ?Other Topics Concern  ? Not on file  ?Social History Narrative  ? Has living will and advance directives.   ? Requests daughter to make health care decisions.  ? Would accept resuscitation but no life prolonging technology if cognitively unaware  ? ?Social Determinants of Health  ? ?Financial Resource Strain: Not on file  ?Food Insecurity: Not on file  ?Transportation Needs: Not on file  ?Physical Activity: Not on file  ?Stress: Not on file  ?Social Connections: Not on file  ?Intimate Partner Violence: Not on file  ? ?Review of Systems ?Appetite is good ?Weight is stable ?Sleeps okay ?Wears seat belt ?Teeth okay---sees dentist ?Bowels are fine--no blood ?No dysuria or hematuria ?No suspicious skin lesion ?No sig back or joint pains ?   ?Objective:  ? Physical Exam ?Constitutional:   ?   Appearance: Normal appearance.  ?HENT:  ?   Right Ear: Tympanic membrane and ear canal normal.  ?   Left Ear: Tympanic membrane and ear canal normal.  ?   Mouth/Throat:  ?   Comments: No lesions ?Eyes:  ?   Conjunctiva/sclera:  Conjunctivae normal.  ?   Pupils: Pupils are equal, round, and reactive to light.  ?Cardiovascular:  ?   Rate and Rhythm: Normal rate and regular rhythm.  ?   Heart sounds: No murmur heard. ?  No gallop.  ?Pulmonary:  ?   Effort: Pulmonary effort is normal.  ?   Breath sounds: Normal breath sounds. No wheezing or rales.  ?Abdominal:  ?  Palpations: Abdomen is soft.  ?   Tenderness: There is no abdominal tenderness.  ?Musculoskeletal:  ?   Cervical back: Neck supple.  ?   Right lower leg: No edema.  ?   Left lower leg: No edema.  ?Lymphadenopathy:  ?   Cervical: No cervical adenopathy.  ?Skin: ?   Findings: No lesion or rash.  ?Neurological:  ?   Mental Status: She is alert and oriented to person, place, and time.  ?   Comments: Mild right arm weakness ?Mini-cog normal  ?Psychiatric:     ?   Mood and Affect: Mood normal.     ?   Behavior: Behavior normal.  ?  ? ? ? ? ?   ?Assessment & Plan:  ? ?

## 2022-05-15 DIAGNOSIS — Z961 Presence of intraocular lens: Secondary | ICD-10-CM | POA: Diagnosis not present

## 2022-09-29 DIAGNOSIS — Z23 Encounter for immunization: Secondary | ICD-10-CM | POA: Diagnosis not present

## 2023-02-12 ENCOUNTER — Other Ambulatory Visit: Payer: Self-pay | Admitting: Internal Medicine

## 2023-05-09 ENCOUNTER — Encounter: Payer: Medicare Other | Admitting: Internal Medicine

## 2023-05-20 ENCOUNTER — Ambulatory Visit (INDEPENDENT_AMBULATORY_CARE_PROVIDER_SITE_OTHER): Payer: Medicare Other | Admitting: Internal Medicine

## 2023-05-20 ENCOUNTER — Encounter: Payer: Self-pay | Admitting: Internal Medicine

## 2023-05-20 VITALS — BP 128/72 | HR 86 | Temp 97.9°F | Ht 61.5 in | Wt 160.0 lb

## 2023-05-20 DIAGNOSIS — Z23 Encounter for immunization: Secondary | ICD-10-CM

## 2023-05-20 DIAGNOSIS — I10 Essential (primary) hypertension: Secondary | ICD-10-CM | POA: Diagnosis not present

## 2023-05-20 DIAGNOSIS — D510 Vitamin B12 deficiency anemia due to intrinsic factor deficiency: Secondary | ICD-10-CM | POA: Diagnosis not present

## 2023-05-20 DIAGNOSIS — I69959 Hemiplegia and hemiparesis following unspecified cerebrovascular disease affecting unspecified side: Secondary | ICD-10-CM | POA: Diagnosis not present

## 2023-05-20 DIAGNOSIS — Z0000 Encounter for general adult medical examination without abnormal findings: Secondary | ICD-10-CM | POA: Diagnosis not present

## 2023-05-20 DIAGNOSIS — N1831 Chronic kidney disease, stage 3a: Secondary | ICD-10-CM

## 2023-05-20 DIAGNOSIS — M159 Polyosteoarthritis, unspecified: Secondary | ICD-10-CM | POA: Diagnosis not present

## 2023-05-20 NOTE — Progress Notes (Signed)
Subjective:    Patient ID: Mallory Rodriguez, female    DOB: January 24, 1933, 89 y.o.   MRN: [id number]  HPI Here with daughter for Medicare wellness visit and follow up of chronic health conditions Reviewed advanced directives Reviewed other doctors--[facility]--ophthal, Dr Winsted Sink, [facility] No hospitalizations or surgery in the past year Vision is okay Hearing has faded --doesn't want hearing aides No recent alcohol--occ glass of wine No tobacco Doesn't really exercise No falls No depression or anhedonia No sig memory issues---daughter notes no issues  Still with some right sided weakness Walks with rollator Some ankle swelling if on feet too long Does dishes and laundry only---daughter handles shopping, cooking Independent with ADLs Rare urinary leakage--no longer needs pads  Last GFR 50  No chest pain or SOB No dizziness or syncope No palpitations No headaches  Still on the rosuvastatin  Current Outpatient Medications on File Prior to Visit  Medication Sig Dispense Refill   acetaminophen (TYLENOL) 650 MG CR tablet Take 650 mg by mouth 3 (three) times daily.     aspirin EC 81 MG tablet Take 81 mg by mouth daily.     CALCIUM-VITAMIN D-VITAMIN K PO Take 1 each by mouth daily.     EPINEPHrine (EPIPEN JR) 0.15 MG/0.3ML injection INJECT ONE SYRINGEFUL INTRAMUSCULARLY AS NEEDED FOR ANAPHYLAXIS 2 each 0   hydrochlorothiazide (HYDRODIURIL) 25 MG tablet Take 1 tablet (25 mg total) by mouth daily. 90 tablet 3   Multiple Vitamins-Minerals (CENTRUM SILVER PO) Take by mouth.     Polyethyl Glycol-Propyl Glycol (SYSTANE OP) Apply 1 drop to eye as needed (for dry eyes).     polyethylene glycol (MIRALAX / GLYCOLAX) packet Take 17 g by mouth daily as needed for mild constipation. 14 each 0   Probiotic Product (ALIGN) 4 MG CAPS Take 1 capsule by mouth daily.     rosuvastatin (CRESTOR) 5 MG tablet Take 1 tablet (5 mg total) by mouth daily. 90 tablet 3   No current  facility-administered medications on file prior to visit.    Allergies  Allergen Reactions   Bee Venom Anaphylaxis   Contrast Media [Iodinated Contrast Media] Hives   Lipitor [Atorvastatin] Swelling   Ace Inhibitors Swelling   Hydrocodone Other (See Comments)    Reaction:  Unknown    Macrodantin Other (See Comments)    Reaction:  Chest pain    Neosporin [Neomycin-Bacitracin Zn-Polymyx] Other (See Comments)    Reaction:  Burning    Sulfa Antibiotics Hives   Epinephrine Palpitations   Prednisone Rash and Other (See Comments)    Reaction:  Dizziness and headache     Past Medical History:  Diagnosis Date   AP (angina pectoris) (HCC)    Basal cell carcinoma    back   Claustrophobia    Diverticulosis of colon    Hiatal hernia    HLD (hyperlipidemia)    HTN (hypertension)    Hypogammaglobulinemia (HCC)    borderline   MVP (mitral valve prolapse)    and tricuspid leak   OA (osteoarthritis)    Pelvic kidney    lower left side   Pernicious anemia    Sciatica    TIA (transient ischemic attack) 08/2015    Past Surgical History:  Procedure Laterality Date   APPENDECTOMY     BASAL CELL CARCINOMA EXCISION     Back   BREAST BIOPSY Right    neg   BREAST LUMPECTOMY     Right (fatty tumor)   CATARACT EXTRACTION W/ INTRAOCULAR LENS  IMPLANT, BILATERAL Bilateral    CHOLECYSTECTOMY OPEN     DILATION AND CURETTAGE OF UTERUS     excision vestibular glands     Film removed Left eye     INGUINAL HERNIA REPAIR     Left side   INTRAMEDULLARY (IM) NAIL INTERTROCHANTERIC Right 01/14/2017   Procedure: ORIF RIGHT INTERTROCH NAIL;  Surgeon: Durene Romans, MD;  Location: [facility] OR;  Service: Orthopedics;  Laterality: Right;   KNEE ARTHROSCOPY W/ PARTIAL MEDIAL MENISCECTOMY  08/2009   right   partial hip surgery     Right ovary and tube corpus     TONSILLECTOMY     TOTAL ABDOMINAL HYSTERECTOMY      Family History  Problem Relation Age of Onset   Heart attack Father 48       CABG    Hypertension Father    Dementia Mother    Cancer Maternal Aunt        Breast-multiple aunts and Gm   Cancer Maternal Uncle        Bladder and lung   Diabetes Paternal Grandmother    Hypertension Brother     Social History   Socioeconomic History   Marital status: Married    Spouse name: Not on file   Number of children: 2   Years of education: Not on file   Highest education level: Not on file  Occupational History   Occupation: Retired-bookkeeping/secretarial work    Comment: retired   Occupation:    Tobacco Use   Smoking status: Never    Passive exposure: Past   Smokeless tobacco: Never  Substance and Sexual Activity   Alcohol use: Yes    Alcohol/week: 3.0 standard drinks of alcohol    Types: 3 Glasses of wine per week   Drug use: No   Sexual activity: Not on file  Other Topics Concern   Not on file  Social History Narrative   Has living will and advance directives.    Requests daughter to make health care decisions.   Would accept resuscitation but no life prolonging technology if cognitively unaware   Social Determinants of Health   Financial Resource Strain: Not on file  Food Insecurity: Not on file  Transportation Needs: Not on file  Physical Activity: Not on file  Stress: Not on file  Social Connections: Not on file  Intimate Partner Violence: Not on file   Review of Systems Appetite is good Weight is stable Sleeps well Wears seat belt Has needed some dental work--recent extraction No heartburn or dysphagia Bowels move okay--no blood No sig back or joint pain No suspicious skin lesions--no recent derm visit    Objective:   Physical Exam Constitutional:      Appearance: Normal appearance.  HENT:     Mouth/Throat:     Pharynx: No oropharyngeal exudate or posterior oropharyngeal erythema.  Eyes:     Conjunctiva/sclera: Conjunctivae normal.     Pupils: Pupils are equal, round, and reactive to light.  Cardiovascular:     Rate and Rhythm: Normal  rate and regular rhythm.     Pulses: Normal pulses.     Heart sounds: No murmur heard.    No gallop.  Pulmonary:     Effort: Pulmonary effort is normal.     Breath sounds: Normal breath sounds. No wheezing or rales.  Abdominal:     Palpations: Abdomen is soft.     Tenderness: There is no abdominal tenderness.  Musculoskeletal:     Cervical back: Neck supple.  Left lower leg: No edema.     Comments: 1+ right ankle edema  Lymphadenopathy:     Cervical: No cervical adenopathy.  Skin:    Findings: No rash.  Neurological:     General: No focal deficit present.     Mental Status: She is alert and oriented to person, place, and time.     Comments: Word naming 3/1 minute---got them quick and froze Recall---1/3 Mild right side weakness  Psychiatric:        Mood and Affect: Mood normal.        Behavior: Behavior normal.            Assessment & Plan:

## 2023-05-20 NOTE — Assessment & Plan Note (Signed)
Uses tylenol infrequently

## 2023-05-20 NOTE — Progress Notes (Signed)
Hearing Screening - Comments:: Has hearing aids. Not wearing today Vision Screening - Comments:: July 2023

## 2023-05-20 NOTE — Assessment & Plan Note (Signed)
Mild If stable, no action BP on low side and has done well with HCTZ----so no ACEI/ARB for now

## 2023-05-20 NOTE — Assessment & Plan Note (Signed)
I have personally reviewed the Medicare Annual Wellness questionnaire and have noted 1. The patient's medical and social history 2. Their use of alcohol, tobacco or illicit drugs 3. Their current medications and supplements 4. The patient's functional ability including ADL's, fall risks, home safety risks and hearing or visual             impairment. 5. Diet and physical activities 6. Evidence for depression or mood disorders  The patients weight, height, BMI and visual acuity have been recorded in the chart I have made referrals, counseling and provided education to the patient based review of the above and I have provided the pt with a written personalized care plan for preventive services.  I have provided you with a copy of your personalized plan for preventive services. Please take the time to review along with your updated medication list.  No cancer screening due to age Asked her to start some exercise--even just chair exercise Will give prevnar 20 Flu and RSV in the fall COVID vaccine when new on comes out

## 2023-05-20 NOTE — Addendum Note (Signed)
Addended by: Eual Fines on: 05/20/2023 04:20 PM   Modules accepted: Orders

## 2023-05-20 NOTE — Assessment & Plan Note (Signed)
BP Readings from Last 3 Encounters:  05/20/23 128/72  04/23/22 122/72  01/17/22 110/70   Good control on HCTZ 25mg  daily

## 2023-05-20 NOTE — Assessment & Plan Note (Signed)
Does okay with the rollator Discussed leg strengthening Is on ASA and rosuvastatin 5mg  daily Will check labs

## 2023-05-20 NOTE — Assessment & Plan Note (Signed)
Past diagnosis Off B12 now--so will recheck

## 2023-05-21 LAB — HEPATIC FUNCTION PANEL
ALT: 9 U/L (ref 0–35)
AST: 18 U/L (ref 0–37)
Albumin: 4.2 g/dL (ref 3.5–5.2)
Alkaline Phosphatase: 48 U/L (ref 39–117)
Bilirubin, Direct: 0.1 mg/dL (ref 0.0–0.3)
Total Bilirubin: 0.4 mg/dL (ref 0.2–1.2)
Total Protein: 7 g/dL (ref 6.0–8.3)

## 2023-05-21 LAB — RENAL FUNCTION PANEL
Albumin: 4.2 g/dL (ref 3.5–5.2)
BUN: 21 mg/dL (ref 6–23)
CO2: 24 mEq/L (ref 19–32)
Calcium: 9.2 mg/dL (ref 8.4–10.5)
Chloride: 105 mEq/L (ref 96–112)
Creatinine, Ser: 1.07 mg/dL (ref 0.40–1.20)
GFR: 46.72 mL/min — ABNORMAL LOW (ref >60.00)
Glucose, Bld: 92 mg/dL (ref 70–99)
Phosphorus: 3.4 mg/dL (ref 2.3–4.6)
Potassium: 4.2 mEq/L (ref 3.5–5.1)
Sodium: 140 mEq/L (ref 135–145)

## 2023-05-21 LAB — VITAMIN B12: Vitamin B-12: 140 pg/mL — ABNORMAL LOW (ref 211–911)

## 2023-05-21 LAB — LIPID PANEL
Cholesterol: 214 mg/dL — ABNORMAL HIGH (ref 0–200)
HDL: 77.8 mg/dL (ref >39.00)
LDL Cholesterol: 105 mg/dL — ABNORMAL HIGH (ref 0–99)
NonHDL: 136.22
Total CHOL/HDL Ratio: 3
Triglycerides: 155 mg/dL — ABNORMAL HIGH (ref 0.0–149.0)
VLDL: 31 mg/dL (ref 0.0–40.0)

## 2023-05-21 LAB — CBC
HCT: 36.3 % (ref 36.0–46.0)
Hemoglobin: 11.7 g/dL — ABNORMAL LOW (ref 12.0–15.0)
MCHC: 32.2 g/dL (ref 30.0–36.0)
MCV: 92.1 fl (ref 78.0–100.0)
Platelets: 240 10*3/uL (ref 150.0–400.0)
RBC: 3.94 Mil/uL (ref 3.87–5.11)
RDW: 14.9 % (ref 11.5–15.5)
WBC: 6 10*3/uL (ref 4.0–10.5)

## 2023-05-23 ENCOUNTER — Telehealth: Payer: Self-pay

## 2023-05-23 ENCOUNTER — Other Ambulatory Visit: Payer: Self-pay

## 2023-05-23 DIAGNOSIS — E538 Deficiency of other specified B group vitamins: Secondary | ICD-10-CM

## 2023-05-23 NOTE — Telephone Encounter (Signed)
Placing B12 orders

## 2023-06-18 ENCOUNTER — Other Ambulatory Visit: Payer: Self-pay | Admitting: Internal Medicine

## 2023-06-19 ENCOUNTER — Ambulatory Visit (INDEPENDENT_AMBULATORY_CARE_PROVIDER_SITE_OTHER): Payer: Medicare Other

## 2023-06-19 DIAGNOSIS — E538 Deficiency of other specified B group vitamins: Secondary | ICD-10-CM

## 2023-06-19 DIAGNOSIS — Z961 Presence of intraocular lens: Secondary | ICD-10-CM | POA: Diagnosis not present

## 2023-06-19 MED ORDER — CYANOCOBALAMIN 1000 MCG/ML IJ SOLN
1000.0000 ug | Freq: Once | INTRAMUSCULAR | Status: AC
  Administered 2023-06-19: 1000 ug via INTRAMUSCULAR

## 2023-06-19 NOTE — Progress Notes (Signed)
Per orders of Dr. Tillman Abide, injection of vitamin b 12 given by Lewanda Rife in right deltoid. Patient tolerated injection well. Patient will make appointment for 1 month.

## 2023-07-18 ENCOUNTER — Ambulatory Visit (INDEPENDENT_AMBULATORY_CARE_PROVIDER_SITE_OTHER): Payer: Medicare Other

## 2023-07-18 DIAGNOSIS — D510 Vitamin B12 deficiency anemia due to intrinsic factor deficiency: Secondary | ICD-10-CM

## 2023-07-18 MED ORDER — CYANOCOBALAMIN 1000 MCG/ML IJ SOLN
1000.0000 ug | Freq: Once | INTRAMUSCULAR | Status: AC
  Administered 2023-07-18: 1000 ug via INTRAMUSCULAR

## 2023-07-18 NOTE — Progress Notes (Signed)
Per orders of Dr. Letvak, injection of vit B12 given by Felicia  Goins. Patient tolerated injection well.  

## 2023-07-31 ENCOUNTER — Encounter (INDEPENDENT_AMBULATORY_CARE_PROVIDER_SITE_OTHER): Payer: Self-pay

## 2023-08-20 ENCOUNTER — Ambulatory Visit: Payer: Medicare Other

## 2023-08-21 ENCOUNTER — Encounter: Payer: Medicare Other | Admitting: Dermatology

## 2023-08-22 ENCOUNTER — Ambulatory Visit: Payer: Medicare Other

## 2023-08-29 ENCOUNTER — Ambulatory Visit: Payer: Medicare Other

## 2023-08-29 ENCOUNTER — Ambulatory Visit (INDEPENDENT_AMBULATORY_CARE_PROVIDER_SITE_OTHER): Payer: Medicare Other

## 2023-08-29 DIAGNOSIS — D510 Vitamin B12 deficiency anemia due to intrinsic factor deficiency: Secondary | ICD-10-CM | POA: Diagnosis not present

## 2023-08-29 MED ORDER — CYANOCOBALAMIN 1000 MCG/ML IJ SOLN
1000.0000 ug | Freq: Once | INTRAMUSCULAR | Status: AC
  Administered 2023-08-29: 1000 ug via INTRAMUSCULAR

## 2023-08-29 NOTE — Progress Notes (Signed)
Per orders of Dr. Tillman Abide, injection of B-12 given by Nanci Pina in right deltoid. Patient tolerated injection well.

## 2023-10-02 ENCOUNTER — Encounter: Payer: Self-pay | Admitting: Dermatology

## 2023-10-02 ENCOUNTER — Ambulatory Visit: Payer: Medicare Other | Admitting: Dermatology

## 2023-10-02 DIAGNOSIS — D1801 Hemangioma of skin and subcutaneous tissue: Secondary | ICD-10-CM

## 2023-10-02 DIAGNOSIS — W908XXA Exposure to other nonionizing radiation, initial encounter: Secondary | ICD-10-CM

## 2023-10-02 DIAGNOSIS — L814 Other melanin hyperpigmentation: Secondary | ICD-10-CM

## 2023-10-02 DIAGNOSIS — L578 Other skin changes due to chronic exposure to nonionizing radiation: Secondary | ICD-10-CM

## 2023-10-02 DIAGNOSIS — Z1283 Encounter for screening for malignant neoplasm of skin: Secondary | ICD-10-CM

## 2023-10-02 DIAGNOSIS — L821 Other seborrheic keratosis: Secondary | ICD-10-CM

## 2023-10-02 DIAGNOSIS — Z85828 Personal history of other malignant neoplasm of skin: Secondary | ICD-10-CM

## 2023-10-02 DIAGNOSIS — L570 Actinic keratosis: Secondary | ICD-10-CM

## 2023-10-02 DIAGNOSIS — D229 Melanocytic nevi, unspecified: Secondary | ICD-10-CM

## 2023-10-02 NOTE — Patient Instructions (Addendum)

## 2023-10-02 NOTE — Progress Notes (Unsigned)
Follow-Up Visit   Subjective  Mallory Rodriguez is a 89 y.o. female who presents for the following: Skin Cancer Screening and Upper Body Skin Exam, hx of BCC on her back. Hx of ISK's on her back.   The patient presents for Upper Body Skin Exam (UBSE) for skin cancer screening and mole check. The patient has spots, moles and lesions to be evaluated, some may be new or changing and the patient may have concern these could be cancer.    The following portions of the chart were reviewed this encounter and updated as appropriate: medications, allergies, medical history  Review of Systems:  No other skin or systemic complaints except as noted in HPI or Assessment and Plan.  Objective  Well appearing patient in no apparent distress; mood and affect are within normal limits.  All skin waist up and legs examined. Relevant physical exam findings are noted in the Assessment and Plan.  right cheek x 1 Erythematous thin papules/macules with gritty scale.     Assessment & Plan   AK (actinic keratosis) right cheek x 1  Actinic keratoses are precancerous spots that appear secondary to cumulative UV radiation exposure/sun exposure over time. They are chronic with expected duration over 1 year. A portion of actinic keratoses will progress to squamous cell carcinoma of the skin. It is not possible to reliably predict which spots will progress to skin cancer and so treatment is recommended to prevent development of skin cancer.  Recommend daily broad spectrum sunscreen SPF 30+ to sun-exposed areas, reapply every 2 hours as needed.  Recommend staying in the shade or wearing long sleeves, sun glasses (UVA+UVB protection) and wide brim hats (4-inch brim around the entire circumference of the hat). Call for new or changing lesions.   Destruction of lesion - right cheek x 1 Complexity: simple   Destruction method: cryotherapy   Informed consent: discussed and consent obtained   Timeout:  patient name,  date of birth, surgical site, and procedure verified Lesion destroyed using liquid nitrogen: Yes   Region frozen until ice ball extended beyond lesion: Yes   Outcome: patient tolerated procedure well with no complications   Post-procedure details: wound care instructions given   Additional details:  Prior to procedure, discussed risks of blister formation, small wound, skin dyspigmentation, or rare scar following cryotherapy. Recommend Vaseline ointment to treated areas while healing.   Multiple benign nevi  Lentigines  Actinic elastosis  Cherry angioma  Seborrheic keratoses   Skin cancer screening performed today.  Actinic Damage - Chronic condition, secondary to cumulative UV/sun exposure - diffuse scaly erythematous macules with underlying dyspigmentation - Recommend daily broad spectrum sunscreen SPF 30+ to sun-exposed areas, reapply every 2 hours as needed.  - Staying in the shade or wearing long sleeves, sun glasses (UVA+UVB protection) and wide brim hats (4-inch brim around the entire circumference of the hat) are also recommended for sun protection.  - Call for new or changing lesions.  Lentigines, Seborrheic Keratoses, Hemangiomas - Benign normal skin lesions - Benign-appearing - Call for any changes  Melanocytic Nevi - Tan-brown and/or pink-flesh-colored symmetric macules and papules - Benign appearing on exam today - Observation - Call clinic for new or changing moles - Recommend daily use of broad spectrum spf 30+ sunscreen to sun-exposed areas.   HISTORY OF BASAL CELL CARCINOMA OF THE SKIN Back  - No evidence of recurrence today - Recommend regular full body skin exams - Recommend daily broad spectrum sunscreen SPF 30+ to sun-exposed areas,  reapply every 2 hours as needed.  - Call if any new or changing lesions are noted between office visits    Return if symptoms worsen or fail to improve.  IAngelique Holm, CMA, am acting as scribe for Elie Goody, MD .   Documentation: I have reviewed the above documentation for accuracy and completeness, and I agree with the above.  Elie Goody, MD

## 2023-10-03 ENCOUNTER — Encounter: Payer: Self-pay | Admitting: Dermatology

## 2023-10-03 ENCOUNTER — Ambulatory Visit: Payer: Medicare Other

## 2023-10-03 DIAGNOSIS — E538 Deficiency of other specified B group vitamins: Secondary | ICD-10-CM | POA: Diagnosis not present

## 2023-10-03 MED ORDER — CYANOCOBALAMIN 1000 MCG/ML IJ SOLN
1000.0000 ug | Freq: Once | INTRAMUSCULAR | Status: AC
  Administered 2023-10-03: 1000 ug via INTRAMUSCULAR

## 2023-10-03 NOTE — Progress Notes (Signed)
Per orders of Dr. Tillman Abide, injection of vitamin b 12  given by Lewanda Rife in left deltoid. Patient tolerated injection well. Patient will make appointment for 1 month.

## 2023-10-12 DIAGNOSIS — Z23 Encounter for immunization: Secondary | ICD-10-CM | POA: Diagnosis not present

## 2023-10-31 ENCOUNTER — Other Ambulatory Visit: Payer: Self-pay | Admitting: Internal Medicine

## 2023-11-07 ENCOUNTER — Ambulatory Visit (INDEPENDENT_AMBULATORY_CARE_PROVIDER_SITE_OTHER): Payer: Medicare Other

## 2023-11-07 DIAGNOSIS — E538 Deficiency of other specified B group vitamins: Secondary | ICD-10-CM | POA: Diagnosis not present

## 2023-11-07 MED ORDER — CYANOCOBALAMIN 1000 MCG/ML IJ SOLN
1000.0000 ug | Freq: Once | INTRAMUSCULAR | Status: AC
  Administered 2023-11-07: 1000 ug via INTRAMUSCULAR

## 2023-11-07 NOTE — Progress Notes (Signed)
Patient presented for B 12 injection given by Makinzy Cleere, CMA to right deltoid, patient voiced no concerns nor showed any signs of distress during injection.  

## 2023-12-18 ENCOUNTER — Ambulatory Visit (INDEPENDENT_AMBULATORY_CARE_PROVIDER_SITE_OTHER): Payer: Medicare Other

## 2023-12-18 DIAGNOSIS — E538 Deficiency of other specified B group vitamins: Secondary | ICD-10-CM

## 2023-12-18 MED ORDER — CYANOCOBALAMIN 1000 MCG/ML IJ SOLN
1000.0000 ug | Freq: Once | INTRAMUSCULAR | Status: AC
  Administered 2023-12-18: 1000 ug via INTRAMUSCULAR

## 2023-12-18 NOTE — Progress Notes (Signed)
Per orders of Dr. Tillman Abide, injection of vitamin b 12  given by Lewanda Rife in left deltoid. Patient tolerated injection well. Patient will make appointment for 1 month.

## 2024-01-15 ENCOUNTER — Other Ambulatory Visit: Payer: Self-pay | Admitting: Internal Medicine

## 2024-01-21 ENCOUNTER — Ambulatory Visit (INDEPENDENT_AMBULATORY_CARE_PROVIDER_SITE_OTHER): Payer: Medicare Other

## 2024-01-21 DIAGNOSIS — E538 Deficiency of other specified B group vitamins: Secondary | ICD-10-CM

## 2024-01-21 MED ORDER — CYANOCOBALAMIN 1000 MCG/ML IJ SOLN
1000.0000 ug | Freq: Once | INTRAMUSCULAR | Status: AC
  Administered 2024-01-21: 1000 ug via INTRAMUSCULAR

## 2024-01-21 NOTE — Progress Notes (Signed)
Per orders of Dr. Tillman Abide, injection of vitamin b 12 given by Lewanda Rife in right deltoid. Patient tolerated injection well. Patient will make appointment for 1 month.

## 2024-02-20 ENCOUNTER — Ambulatory Visit: Payer: Medicare Other

## 2024-02-26 ENCOUNTER — Ambulatory Visit (INDEPENDENT_AMBULATORY_CARE_PROVIDER_SITE_OTHER): Payer: Medicare Other

## 2024-02-26 DIAGNOSIS — E538 Deficiency of other specified B group vitamins: Secondary | ICD-10-CM

## 2024-02-26 MED ORDER — CYANOCOBALAMIN 1000 MCG/ML IJ SOLN
1000.0000 ug | Freq: Once | INTRAMUSCULAR | Status: AC
  Administered 2024-02-26: 1000 ug via INTRAMUSCULAR

## 2024-02-26 NOTE — Progress Notes (Signed)
 Per orders of Mayra Reel, DPN AGNP-C, injection of B-12 given by Leonor Liv in left deltoid. Patient tolerated injection well. Patient will make appointment for 1 month.

## 2024-03-24 ENCOUNTER — Ambulatory Visit (INDEPENDENT_AMBULATORY_CARE_PROVIDER_SITE_OTHER): Payer: Medicare Other

## 2024-03-24 DIAGNOSIS — E538 Deficiency of other specified B group vitamins: Secondary | ICD-10-CM | POA: Diagnosis not present

## 2024-03-24 MED ORDER — CYANOCOBALAMIN 1000 MCG/ML IJ SOLN
1000.0000 ug | Freq: Once | INTRAMUSCULAR | Status: AC
  Administered 2024-03-24: 1000 ug via INTRAMUSCULAR

## 2024-03-24 NOTE — Progress Notes (Signed)
Per orders of Dr. Tillman Abide, injection of vitamin b 12 given by Lewanda Rife in right deltoid. Patient tolerated injection well. Patient will make appointment for 1 month.

## 2024-04-23 ENCOUNTER — Ambulatory Visit: Payer: Medicare Other

## 2024-04-23 DIAGNOSIS — E538 Deficiency of other specified B group vitamins: Secondary | ICD-10-CM

## 2024-04-23 MED ORDER — CYANOCOBALAMIN 1000 MCG/ML IJ SOLN
1000.0000 ug | Freq: Once | INTRAMUSCULAR | Status: AC
  Administered 2024-04-23: 1000 ug via INTRAMUSCULAR

## 2024-04-23 NOTE — Progress Notes (Signed)
Per orders of Dr. Tillman Abide, injection of B12 given by Donnamarie Poag in left deltoid. Patient tolerated injection well. Patient will make appointment for 1 month.

## 2024-06-02 ENCOUNTER — Ambulatory Visit

## 2024-06-02 DIAGNOSIS — E538 Deficiency of other specified B group vitamins: Secondary | ICD-10-CM | POA: Diagnosis not present

## 2024-06-02 MED ORDER — CYANOCOBALAMIN 1000 MCG/ML IJ SOLN
1000.0000 ug | Freq: Once | INTRAMUSCULAR | Status: AC
  Administered 2024-06-02: 1000 ug via INTRAMUSCULAR

## 2024-06-02 NOTE — Progress Notes (Signed)
 Per orders of Dr. Herby Lolling, injection of B-12 given by Kris Pester in right deltoid. Patient tolerated injection well.

## 2024-06-22 DIAGNOSIS — Z961 Presence of intraocular lens: Secondary | ICD-10-CM | POA: Diagnosis not present

## 2024-07-02 ENCOUNTER — Ambulatory Visit

## 2024-07-02 ENCOUNTER — Telehealth: Payer: Self-pay | Admitting: Internal Medicine

## 2024-07-02 DIAGNOSIS — E538 Deficiency of other specified B group vitamins: Secondary | ICD-10-CM

## 2024-07-02 MED ORDER — CYANOCOBALAMIN 1000 MCG/ML IJ SOLN
1000.0000 ug | Freq: Once | INTRAMUSCULAR | Status: AC
  Administered 2024-07-02: 1000 ug via INTRAMUSCULAR

## 2024-07-02 NOTE — Telephone Encounter (Signed)
 Pt asked if Dr. Cleatus would accept her as a new pt since Dr. Jimmy is retiring soon? Pt states her husband, Judithe Keetch, is a current pt of Dr. Cleatus. Is this TOC okay? Pease advise. Call back # [phone]

## 2024-07-02 NOTE — Telephone Encounter (Signed)
 Yes, thanks

## 2024-07-02 NOTE — Progress Notes (Signed)
Per orders of Dr. Tillman Abide, injection of vitamin b 12  given by Lewanda Rife in left deltoid. Patient tolerated injection well. Patient will make appointment for 1 month.

## 2024-08-04 ENCOUNTER — Ambulatory Visit (INDEPENDENT_AMBULATORY_CARE_PROVIDER_SITE_OTHER)

## 2024-08-04 DIAGNOSIS — E538 Deficiency of other specified B group vitamins: Secondary | ICD-10-CM | POA: Diagnosis not present

## 2024-08-04 MED ORDER — CYANOCOBALAMIN 1000 MCG/ML IJ SOLN
1000.0000 ug | Freq: Once | INTRAMUSCULAR | Status: AC
  Administered 2024-08-04: 1000 ug via INTRAMUSCULAR

## 2024-08-04 NOTE — Progress Notes (Signed)
 Per orders of Dr. Charlie Denise, injection of vitamin b 12 inj given by Laray Arenas in right deltoid. Patient tolerated injection well. Patient will make appointment for 1 month.

## 2024-08-06 ENCOUNTER — Ambulatory Visit

## 2024-08-07 ENCOUNTER — Ambulatory Visit

## 2024-08-07 VITALS — Ht 61.5 in | Wt 160.0 lb

## 2024-08-07 DIAGNOSIS — Z0000 Encounter for general adult medical examination without abnormal findings: Secondary | ICD-10-CM

## 2024-08-07 NOTE — Patient Instructions (Signed)
 Mallory Rodriguez , Thank you for taking time out of your busy schedule to complete your Annual Wellness Visit with me. I enjoyed our conversation and look forward to speaking with you again next year. I, as well as your care team,  appreciate your ongoing commitment to your health goals. Please review the following plan we discussed and let me know if I can assist you in the future. Your Game plan/ To Do List    Referrals: If you haven't heard from the office you've been referred to, please reach out to them at the phone provided.   Follow up Visits: We will see or speak with you next year for your Next Medicare AWV with our clinical staff Have you seen your provider in the last 6 months (3 months if uncontrolled diabetes)? Yes  Clinician Recommendations:  Aim for 30 minutes of exercise or brisk walking, 6-8 glasses of water, and 5 servings of fruits and vegetables each day.       This is a list of the screenings recommended for you:  Health Maintenance  Topic Date Due   Zoster (Shingles) Vaccine (1 of 2) 12/04/1952   Mammogram  10/19/2018   COVID-19 Vaccine (7 - Pfizer risk 2024-25 season) 04/11/2024   Flu Shot  07/31/2024   Medicare Annual Wellness Visit  08/07/2025   DTaP/Tdap/Td vaccine (4 - Td or Tdap) 01/16/2028   Pneumococcal Vaccine for age over 39  Completed   DEXA scan (bone density measurement)  Completed   Hepatitis B Vaccine  Aged Out   HPV Vaccine  Aged Out   Meningitis B Vaccine  Aged Out    Advanced directives: (In Chart) A copy of your advanced directives are scanned into your chart should your provider ever need it. Advance Care Planning is important because it:  [x]  Makes sure you receive the medical care that is consistent with your values, goals, and preferences  [x]  It provides guidance to your family and loved ones and reduces their decisional burden about whether or not they are making the right decisions based on your wishes.  Follow the link provided in your  after visit summary or read over the paperwork we have mailed to you to help you started getting your Advance Directives in place. If you need assistance in completing these, please reach out to us  so that we can help you!

## 2024-08-07 NOTE — Progress Notes (Signed)
 Subjective:   Mallory Rodriguez is a 89 y.o. who presents for a Medicare Wellness preventive visit.  As a reminder, Annual Wellness Visits don't include a physical exam, and some assessments may be limited, especially if this visit is performed virtually. We may recommend an in-person follow-up visit with your provider if needed.  Visit Complete: Virtual I connected with  [name] on 08/07/24 by a audio enabled telemedicine application and verified that I am speaking with the correct person using two identifiers.  Patient Location: Home  Provider Location: Office/Clinic  I discussed the limitations of evaluation and management by telemedicine. The patient expressed understanding and agreed to proceed.  Vital Signs: Because this visit was a virtual/telehealth visit, some criteria may be missing or patient reported. Any vitals not documented were not able to be obtained and vitals that have been documented are patient reported.  VideoDeclined- This patient declined Librarian, academic. Therefore the visit was completed with audio only.  Persons Participating in Visit: Patient.and her daughter Mallory Rodriguez  AWV Questionnaire: No: Patient Medicare AWV questionnaire was not completed prior to this visit.  Cardiac Risk Factors include: advanced age (>31men, >64 women);dyslipidemia;hypertension;sedentary lifestyle     Objective:    Today's Vitals   08/07/24 1503  Weight: 160 lb (72.6 kg)  Height: 5' 1.5 (1.562 m)   Body mass index is 29.74 kg/m.     08/07/2024    3:12 PM 01/03/2022    7:53 AM 08/05/2020   12:37 PM 01/18/2017   10:33 AM 01/13/2017   11:24 PM 12/18/2016    8:59 AM 06/11/2016   12:18 PM  Advanced Directives  Does Patient Have a Medical Advance Directive? Yes No No Yes  Yes  Yes  Yes   Type of Estate agent of [place];Living will   Healthcare Power of [place];Living will  Healthcare Power of [employer] of  [place];Living will   Does patient want to make changes to medical advance directive?    No - Patient declined  Yes (Inpatient - patient requests chaplain consult to change a medical advance directive)  No - Patient declined    Copy of Healthcare Power of Attorney in Chart? Yes - validated most recent copy scanned in chart (See row information)   Yes   Yes  No - copy requested   Would patient like information on creating a medical advance directive?  No - Patient declined No - Patient declined         Data saved with a previous flowsheet row definition    Current Medications (verified) Outpatient Encounter Medications as of 08/07/2024  Medication Sig   acetaminophen  (TYLENOL ) 650 MG CR tablet Take 650 mg by mouth 3 (three) times daily.   aspirin  EC 81 MG tablet Take 81 mg by mouth daily.   CALCIUM -VITAMIN D -VITAMIN K PO Take 1 each by mouth daily.   cyanocobalamin  (VITAMIN B12) 1000 MCG/ML injection Inject 1,000 mcg into the muscle every 30 (thirty) days.   EPINEPHrine  (EPIPEN  JR) 0.15 MG/0.3ML injection INJECT ONE SYRINGEFUL INTRAMUSCULARLY AS NEEDED FOR ANAPHYLAXIS   hydrochlorothiazide  (HYDRODIURIL ) 25 MG tablet TAKE 1 TABLET (25 MG TOTAL) BY MOUTH DAILY.   Multiple Vitamins-Minerals (CENTRUM SILVER PO) Take by mouth.   Polyethyl Glycol-Propyl Glycol (SYSTANE OP) Apply 1 drop to eye as needed (for dry eyes).   polyethylene glycol (MIRALAX  / GLYCOLAX ) packet Take 17 g by mouth daily as needed for mild constipation.   Probiotic Product (ALIGN) 4 MG CAPS Take  1 capsule by mouth daily.   rosuvastatin  (CRESTOR ) 5 MG tablet TAKE 1 TABLET (5 MG TOTAL) BY MOUTH DAILY.   No facility-administered encounter medications on file as of 08/07/2024.    Allergies (verified) Bee venom, Contrast media [iodinated contrast media], Lipitor [atorvastatin], Ace inhibitors, Hydrocodone , Macrodantin , Neosporin [neomycin-bacitracin zn-polymyx], Sulfa antibiotics, Epinephrine , and Prednisone    History: Past  Medical History:  Diagnosis Date   AP (angina pectoris) (HCC)    Basal cell carcinoma    back   Claustrophobia    Diverticulosis of colon    Hiatal hernia    HLD (hyperlipidemia)    HTN (hypertension)    Hypogammaglobulinemia (HCC)    borderline   MVP (mitral valve prolapse)    and tricuspid leak   OA (osteoarthritis)    Pelvic kidney    lower left side   Pernicious anemia    Sciatica    TIA (transient ischemic attack) 08/2015   Past Surgical History:  Procedure Laterality Date   APPENDECTOMY     BASAL CELL CARCINOMA EXCISION     Back   BREAST BIOPSY Right    neg   BREAST LUMPECTOMY     Right (fatty tumor)   CATARACT EXTRACTION W/ INTRAOCULAR LENS  IMPLANT, BILATERAL Bilateral    CHOLECYSTECTOMY OPEN     DILATION AND CURETTAGE OF UTERUS     excision vestibular glands     Film removed Left eye     INGUINAL HERNIA REPAIR     Left side   INTRAMEDULLARY (IM) NAIL INTERTROCHANTERIC Right 01/14/2017   Procedure: ORIF RIGHT INTERTROCH NAIL;  Surgeon: Donnice Car, MD;  Location: [facility] OR;  Service: Orthopedics;  Laterality: Right;   KNEE ARTHROSCOPY W/ PARTIAL MEDIAL MENISCECTOMY  08/2009   right   partial hip surgery     Right ovary and tube corpus     TONSILLECTOMY     TOTAL ABDOMINAL HYSTERECTOMY     Family History  Problem Relation Age of Onset   Heart attack Father 35       CABG   Hypertension Father    Dementia Mother    Cancer Maternal Aunt        Breast-multiple aunts and Gm   Cancer Maternal Uncle        Bladder and lung   Diabetes Paternal Grandmother    Hypertension Brother    Social History   Socioeconomic History   Marital status: Married    Spouse name: Not on file   Number of children: 2   Years of education: Not on file   Highest education level: Not on file  Occupational History   Occupation: Retired-bookkeeping/secretarial work    Comment: retired   Occupation:    Tobacco Use   Smoking status: Never    Passive exposure: Past   Smokeless  tobacco: Never  Substance and Sexual Activity   Alcohol  use: Yes    Alcohol /week: 3.0 standard drinks of alcohol     Types: 3 Glasses of wine per week   Drug use: No   Sexual activity: Not on file  Other Topics Concern   Not on file  Social History Narrative   Has living will and advance directives.    Requests daughter to make health care decisions.   Would accept resuscitation but no life prolonging technology if cognitively unaware   Social Drivers of Health   Financial Resource Strain: Low Risk  (08/07/2024)   Overall Financial Resource Strain (CARDIA)    Difficulty of Paying Living Expenses:  Not hard at all  Food Insecurity: No Food Insecurity (08/07/2024)   Hunger Vital Sign    Worried About Running Out of Food in the Last Year: Never true    Ran Out of Food in the Last Year: Never true  Transportation Needs: No Transportation Needs (08/07/2024)   PRAPARE - Administrator, Civil Service (Medical): No    Lack of Transportation (Non-Medical): No  Physical Activity: Inactive (08/07/2024)   Exercise Vital Sign    Days of Exercise per Week: 0 days    Minutes of Exercise per Session: 0 min  Stress: No Stress Concern Present (08/07/2024)   [employer] of Occupational Health - Occupational Stress Questionnaire    Feeling of Stress: Not at all  Social Connections: Socially Isolated (08/07/2024)   Social Connection and Isolation Panel    Frequency of Communication with Friends and Family: Never    Frequency of Social Gatherings with Friends and Family: Once a week    Attends Religious Services: Never    Database administrator or Organizations: No    Attends Engineer, structural: Never    Marital Status: Married    Tobacco Counseling Counseling given: Not Answered   Clinical Intake:  Pre-visit preparation completed: Yes  Pain : No/denies pain    BMI - recorded: 29.74 Nutritional Status: BMI 25 -29 Overweight Nutritional Risks: None Diabetes: No  No  results found for: HGBA1C   How often do you need to have someone help you when you read instructions, pamphlets, or other written materials from your doctor or pharmacy?: 1 - Never  Interpreter Needed?: No  Comments: lives with husband Information entered by :: B.Shannan Garfinkel,LPN   Activities of Daily Living     08/07/2024    3:13 PM  In your present state of health, do you have any difficulty performing the following activities:  Hearing? 1  Vision? 0  Difficulty concentrating or making decisions? 1  Walking or climbing stairs? 1  Dressing or bathing? 0  Doing errands, shopping? 1  Preparing Food and eating ? Y  Using the Toilet? N  In the past six months, have you accidently leaked urine? N  Do you have problems with loss of bowel control? N  Managing your Medications? N  Managing your Finances? Y  Housekeeping or managing your Housekeeping? Y    Patient Care Team: Jimmy Charlie FERNS, MD as PCP - General Gollan, Timothy J, MD as Consulting Physician (Cardiology) Dingeldein, Elspeth, MD (Ophthalmology)  I have updated your Care Teams any recent Medical Services you may have received from other providers in the past year.     Assessment:   This is a routine wellness examination for Lorenza.  Hearing/Vision screen Hearing Screening - Comments:: Pt says her hearing is ok (does not wear her hearing aids in years) Vision Screening - Comments:: Pt says her vision is good w/glasses Dr Lenn   Goals Addressed             This Visit's Progress    Patient Stated       No goals       Depression Screen     08/07/2024    3:09 PM 05/20/2023    3:59 PM 05/20/2023    3:31 PM 04/23/2022    2:45 PM 10/25/2020   11:31 AM 01/10/2018    9:47 AM 12/18/2016    9:13 AM  PHQ 2/9 Scores  PHQ - 2 Score 0 0 0 0 0  0  Exception Documentation      Medical reason     Fall Risk     08/07/2024    3:07 PM 05/20/2023    3:59 PM 05/20/2023    3:31 PM 04/23/2022    2:45 PM 10/25/2020    11:30 AM  Fall Risk   Falls in the past year? 0 0 0 0 1  Number falls in past yr: 0  0  0  Injury with Fall? 0  0  1  Risk for fall due to : No Fall Risks  Impaired mobility;Orthopedic patient    Follow up Falls prevention discussed;Education provided  Falls evaluation completed  Falls prevention discussed      Data saved with a previous flowsheet row definition    MEDICARE RISK AT HOME:  Medicare Risk at Home Any stairs in or around the home?: No If so, are there any without handrails?: Yes Home free of loose throw rugs in walkways, pet beds, electrical cords, etc?: Yes Adequate lighting in your home to reduce risk of falls?: Yes Life alert?: No Use of a cane, walker or w/c?: Yes Grab bars in the bathroom?: Yes Shower chair or bench in shower?: Yes Elevated toilet seat or a handicapped toilet?: Yes  TIMED UP AND GO:  Was the test performed?  No  Cognitive Function: 6CIT completed        08/07/2024    3:14 PM  6CIT Screen  What Year? 0 points  What month? 0 points  What time? 0 points  Count back from 20 0 points  Months in reverse 4 points  Repeat phrase 8 points  Total Score 12 points    Immunizations Immunization History  Administered Date(s) Administered    sv, Bivalent, Protein Subunit Rsvpref,pf (Abrysvo) 10/12/2023   Fluad Quad(high Dose 65+) 10/25/2020   Influenza Split 09/27/2011, 09/12/2012   Influenza Whole 10/04/2010   Influenza, High Dose Seasonal PF 11/10/2019, 10/12/2023   Influenza,inj,Quad PF,6+ Mos 10/08/2013, 09/15/2014, 08/31/2015, 09/18/2016, 10/18/2017   Influenza-Unspecified 09/08/2018, 09/23/2021, 09/29/2022   PFIZER Comirnaty(Gray Top)Covid-19 Tri-Sucrose Vaccine 09/29/2022   PFIZER(Purple Top)SARS-COV-2 Vaccination 02/03/2020, 02/24/2020, 09/27/2020   PNEUMOCOCCAL CONJUGATE-20 05/20/2023   Pfizer Covid-19 Vaccine Bivalent Booster 47yrs & up 09/23/2021   Pfizer(Comirnaty)Fall Seasonal Vaccine 12 years and older 10/12/2023    Pneumococcal Conjugate-13 11/30/2014   Pneumococcal Polysaccharide-23 12/31/2000, 09/05/2004   Td 01/01/2004, 01/07/2008   Tdap 01/15/2018   Zoster, Live 06/20/2011    Screening Tests Health Maintenance  Topic Date Due   Zoster Vaccines- Shingrix (1 of 2) 12/04/1952   MAMMOGRAM  10/19/2018   COVID-19 Vaccine (7 - Pfizer risk 2024-25 season) 04/11/2024   INFLUENZA VACCINE  07/31/2024   Medicare Annual Wellness (AWV)  08/07/2025   DTaP/Tdap/Td (4 - Td or Tdap) 01/16/2028   Pneumococcal Vaccine: 50+ Years  Completed   DEXA SCAN  Completed   Hepatitis B Vaccines  Aged Out   HPV VACCINES  Aged Out   Meningococcal B Vaccine  Aged Out    Health Maintenance  Health Maintenance Due  Topic Date Due   Zoster Vaccines- Shingrix (1 of 2) 12/04/1952   MAMMOGRAM  10/19/2018   COVID-19 Vaccine (7 - Pfizer risk 2024-25 season) 04/11/2024   INFLUENZA VACCINE  07/31/2024   Health Maintenance Items Addressed: None needed at this time   Additional Screening:  Vision Screening: Recommended annual ophthalmology exams for early detection of glaucoma and other disorders of the eye. Would you like a referral to an eye doctor? No  Dental Screening: Recommended annual dental exams for proper oral hygiene  Community Resource Referral / Chronic Care Management: CRR required this visit?  No   CCM required this visit?  No   Plan:    I have personally reviewed and noted the following in the patient's chart:   Medical and social history Use of alcohol , tobacco or illicit drugs  Current medications and supplements including opioid prescriptions. Patient is not currently taking opioid prescriptions. Functional ability and status Nutritional status Physical activity Advanced directives List of other physicians Hospitalizations, surgeries, and ER visits in previous 12 months Vitals Screenings to include cognitive, depression, and falls Referrals and appointments  In addition, I have  reviewed and discussed with patient certain preventive protocols, quality metrics, and best practice recommendations. A written personalized care plan for preventive services as well as general preventive health recommendations were provided to patient.   Erminio LITTIE Saris, LPN   12/31/7972   After Visit Summary: (MyChart) Due to this being a telephonic visit, the after visit summary with patients personalized plan was offered to patient via MyChart   Notes: Nothing significant to report at this time.

## 2024-08-24 ENCOUNTER — Encounter: Payer: Self-pay | Admitting: Internal Medicine

## 2024-08-24 ENCOUNTER — Ambulatory Visit (INDEPENDENT_AMBULATORY_CARE_PROVIDER_SITE_OTHER): Admitting: Internal Medicine

## 2024-08-24 VITALS — BP 116/78 | HR 72 | Temp 98.6°F | Ht 61.5 in | Wt 157.0 lb

## 2024-08-24 DIAGNOSIS — I69959 Hemiplegia and hemiparesis following unspecified cerebrovascular disease affecting unspecified side: Secondary | ICD-10-CM

## 2024-08-24 DIAGNOSIS — M150 Primary generalized (osteo)arthritis: Secondary | ICD-10-CM

## 2024-08-24 DIAGNOSIS — D510 Vitamin B12 deficiency anemia due to intrinsic factor deficiency: Secondary | ICD-10-CM

## 2024-08-24 DIAGNOSIS — N1831 Chronic kidney disease, stage 3a: Secondary | ICD-10-CM

## 2024-08-24 DIAGNOSIS — I10 Essential (primary) hypertension: Secondary | ICD-10-CM

## 2024-08-24 NOTE — Assessment & Plan Note (Signed)
 Better Not taking tylenol  much

## 2024-08-24 NOTE — Assessment & Plan Note (Signed)
 BP Readings from Last 3 Encounters:  08/24/24 116/78  05/20/23 128/72  04/23/22 122/72   Controlled with hydrochlorothiazide  25

## 2024-08-24 NOTE — Assessment & Plan Note (Signed)
 Fairly stable No ACEI/ARB due to age and BP fairly low

## 2024-08-24 NOTE — Progress Notes (Signed)
 Subjective:    Patient ID: Mallory Rodriguez, female    DOB: 05/10/1933, 89 y.o.   MRN: [id number]  HPI Here for follow up of chronic medical conditions With daughter  Same right sided weakness Gets along okay with the rollator Still does laundry and dishes----daughter/SIL handle the groceries and cooking Independent with instrumental ADLs  No problems with joint pain Hasn't needed the tylenol   Gets monthly B12 injections Is on statin  Labs reviewed  GFR 42-55 (mostly over 45)  No chest pain or SOB No dizziness or syncope No palpitations Ankles always mildly swollen--mostly the right  Current Outpatient Medications on File Prior to Visit  Medication Sig Dispense Refill   acetaminophen  (TYLENOL ) 650 MG CR tablet Take 650 mg by mouth 3 (three) times daily.     aspirin  EC 81 MG tablet Take 81 mg by mouth daily.     CALCIUM -VITAMIN D -VITAMIN K PO Take 1 each by mouth daily.     cyanocobalamin  (VITAMIN B12) 1000 MCG/ML injection Inject 1,000 mcg into the muscle every 30 (thirty) days.     EPINEPHrine  (EPIPEN  JR) 0.15 MG/0.3ML injection INJECT ONE SYRINGEFUL INTRAMUSCULARLY AS NEEDED FOR ANAPHYLAXIS 2 each 0   hydrochlorothiazide  (HYDRODIURIL ) 25 MG tablet TAKE 1 TABLET (25 MG TOTAL) BY MOUTH DAILY. 90 tablet 3   Multiple Vitamins-Minerals (CENTRUM SILVER PO) Take by mouth.     Polyethyl Glycol-Propyl Glycol (SYSTANE OP) Apply 1 drop to eye as needed (for dry eyes).     polyethylene glycol (MIRALAX  / GLYCOLAX ) packet Take 17 g by mouth daily as needed for mild constipation. 14 each 0   Probiotic Product (ALIGN) 4 MG CAPS Take 1 capsule by mouth daily.     rosuvastatin  (CRESTOR ) 5 MG tablet TAKE 1 TABLET (5 MG TOTAL) BY MOUTH DAILY. 90 tablet 3   No current facility-administered medications on file prior to visit.    Allergies  Allergen Reactions   Bee Venom Anaphylaxis   Contrast Media [Iodinated Contrast Media] Hives   Lipitor [Atorvastatin] Swelling   Ace Inhibitors Swelling    Hydrocodone  Other (See Comments)    Reaction:  Unknown    Macrodantin  Other (See Comments)    Reaction:  Chest pain    Neosporin [Neomycin-Bacitracin Zn-Polymyx] Other (See Comments)    Reaction:  Burning    Sulfa Antibiotics Hives   Epinephrine  Palpitations   Prednisone  Rash and Other (See Comments)    Reaction:  Dizziness and headache     Past Medical History:  Diagnosis Date   AP (angina pectoris) (HCC)    Basal cell carcinoma    back   Claustrophobia    Diverticulosis of colon    Hiatal hernia    HLD (hyperlipidemia)    HTN (hypertension)    Hypogammaglobulinemia (HCC)    borderline   MVP (mitral valve prolapse)    and tricuspid leak   OA (osteoarthritis)    Pelvic kidney    lower left side   Pernicious anemia    Sciatica    TIA (transient ischemic attack) 08/2015    Past Surgical History:  Procedure Laterality Date   APPENDECTOMY     BASAL CELL CARCINOMA EXCISION     Back   BREAST BIOPSY Right    neg   BREAST LUMPECTOMY     Right (fatty tumor)   CATARACT EXTRACTION W/ INTRAOCULAR LENS  IMPLANT, BILATERAL Bilateral    CHOLECYSTECTOMY OPEN     DILATION AND CURETTAGE OF UTERUS     excision vestibular glands  Film removed Left eye     INGUINAL HERNIA REPAIR     Left side   INTRAMEDULLARY (IM) NAIL INTERTROCHANTERIC Right 01/14/2017   Procedure: ORIF RIGHT INTERTROCH NAIL;  Surgeon: Donnice Car, MD;  Location: [facility] OR;  Service: Orthopedics;  Laterality: Right;   KNEE ARTHROSCOPY W/ PARTIAL MEDIAL MENISCECTOMY  08/2009   right   partial hip surgery     Right ovary and tube corpus     TONSILLECTOMY     TOTAL ABDOMINAL HYSTERECTOMY      Family History  Problem Relation Age of Onset   Heart attack Father 53       CABG   Hypertension Father    Dementia Mother    Cancer Maternal Aunt        Breast-multiple aunts and Gm   Cancer Maternal Uncle        Bladder and lung   Diabetes Paternal Grandmother    Hypertension Brother     Social History    Socioeconomic History   Marital status: Married    Spouse name: Not on file   Number of children: 2   Years of education: Not on file   Highest education level: Not on file  Occupational History   Occupation: Retired-bookkeeping/secretarial work    Comment: retired   Occupation:    Tobacco Use   Smoking status: Never    Passive exposure: Past   Smokeless tobacco: Never  Substance and Sexual Activity   Alcohol  use: Yes    Alcohol /week: 3.0 standard drinks of alcohol     Types: 3 Glasses of wine per week   Drug use: No   Sexual activity: Not on file  Other Topics Concern   Not on file  Social History Narrative   Has living will and advance directives.    Requests daughter to make health care decisions.   Would accept resuscitation but no life prolonging technology if cognitively unaware   Social Drivers of Health   Financial Resource Strain: Low Risk  (08/07/2024)   Overall Financial Resource Strain (CARDIA)    Difficulty of Paying Living Expenses: Not hard at all  Food Insecurity: No Food Insecurity (08/07/2024)   Hunger Vital Sign    Worried About Running Out of Food in the Last Year: Never true    Ran Out of Food in the Last Year: Never true  Transportation Needs: No Transportation Needs (08/07/2024)   PRAPARE - Administrator, Civil Service (Medical): No    Lack of Transportation (Non-Medical): No  Physical Activity: Inactive (08/07/2024)   Exercise Vital Sign    Days of Exercise per Week: 0 days    Minutes of Exercise per Session: 0 min  Stress: No Stress Concern Present (08/07/2024)   [employer] of Occupational Health - Occupational Stress Questionnaire    Feeling of Stress: Not at all  Social Connections: Socially Isolated (08/07/2024)   Social Connection and Isolation Panel    Frequency of Communication with Friends and Family: Never    Frequency of Social Gatherings with Friends and Family: Once a week    Attends Religious Services: Never    Automotive engineer or Organizations: No    Attends Banker Meetings: Never    Marital Status: Married  Catering manager Violence: Not At Risk (08/07/2024)   Humiliation, Afraid, Rape, and Kick questionnaire    Fear of Current or Ex-Partner: No    Emotionally Abused: No    Physically Abused: No  Sexually Abused: No   Review of Systems Hearing is poor--has resisted aides Vision is okay Sleeps well Appetite is good Weight is stable Bowels move fine    Objective:   Physical Exam Cardiovascular:     Rate and Rhythm: Normal rate and regular rhythm.     Pulses: Normal pulses.     Heart sounds: No murmur heard.    No gallop.  Pulmonary:     Effort: Pulmonary effort is normal.     Breath sounds: Normal breath sounds. No wheezing or rales.  Abdominal:     Palpations: Abdomen is soft.     Tenderness: There is no abdominal tenderness.  Musculoskeletal:     Cervical back: Neck supple.     Right lower leg: No edema.     Left lower leg: No edema.  Lymphadenopathy:     Cervical: No cervical adenopathy.  Neurological:     Comments: Mild right side weakness (arm and leg)  Psychiatric:        Mood and Affect: Mood normal.        Behavior: Behavior normal.            Assessment & Plan:

## 2024-08-24 NOTE — Assessment & Plan Note (Signed)
 Getting the B12 injections

## 2024-08-24 NOTE — Assessment & Plan Note (Signed)
 Mild On rosuvastatin  5mg  and ASA 81mg 

## 2024-08-25 ENCOUNTER — Ambulatory Visit: Payer: Self-pay | Admitting: Internal Medicine

## 2024-08-25 LAB — HEPATIC FUNCTION PANEL
ALT: 11 U/L (ref 0–35)
AST: 17 U/L (ref 0–37)
Albumin: 4.4 g/dL (ref 3.5–5.2)
Alkaline Phosphatase: 48 U/L (ref 39–117)
Bilirubin, Direct: 0.1 mg/dL (ref 0.0–0.3)
Total Bilirubin: 0.3 mg/dL (ref 0.2–1.2)
Total Protein: 7 g/dL (ref 6.0–8.3)

## 2024-08-25 LAB — RENAL FUNCTION PANEL
Albumin: 4.4 g/dL (ref 3.5–5.2)
BUN: 23 mg/dL (ref 6–23)
CO2: 26 meq/L (ref 19–32)
Calcium: 9 mg/dL (ref 8.4–10.5)
Chloride: 105 meq/L (ref 96–112)
Creatinine, Ser: 1.22 mg/dL — ABNORMAL HIGH (ref 0.40–1.20)
GFR: 39.56 mL/min — ABNORMAL LOW (ref >60.00)
Glucose, Bld: 86 mg/dL (ref 70–99)
Phosphorus: 3.8 mg/dL (ref 2.3–4.6)
Potassium: 4.1 meq/L (ref 3.5–5.1)
Sodium: 144 meq/L (ref 135–145)

## 2024-08-25 LAB — CBC
HCT: 36.2 % (ref 36.0–46.0)
Hemoglobin: 12 g/dL (ref 12.0–15.0)
MCHC: 33 g/dL (ref 30.0–36.0)
MCV: 91.5 fl (ref 78.0–100.0)
Platelets: 231 K/uL (ref 150.0–400.0)
RBC: 3.96 Mil/uL (ref 3.87–5.11)
RDW: 14 % (ref 11.5–15.5)
WBC: 6.8 K/uL (ref 4.0–10.5)

## 2024-08-25 LAB — LIPID PANEL
Cholesterol: 204 mg/dL — ABNORMAL HIGH (ref 0–200)
HDL: 84.3 mg/dL (ref >39.00)
LDL Cholesterol: 99 mg/dL (ref 0–99)
NonHDL: 119.85
Total CHOL/HDL Ratio: 2
Triglycerides: 106 mg/dL (ref 0.0–149.0)
VLDL: 21.2 mg/dL (ref 0.0–40.0)

## 2024-08-25 LAB — VITAMIN B12: Vitamin B-12: 427 pg/mL (ref 211–911)

## 2024-09-03 ENCOUNTER — Ambulatory Visit

## 2024-09-03 DIAGNOSIS — D510 Vitamin B12 deficiency anemia due to intrinsic factor deficiency: Secondary | ICD-10-CM | POA: Diagnosis not present

## 2024-09-03 DIAGNOSIS — Z23 Encounter for immunization: Secondary | ICD-10-CM | POA: Diagnosis not present

## 2024-09-03 MED ORDER — CYANOCOBALAMIN 1000 MCG/ML IJ SOLN
1000.0000 ug | Freq: Once | INTRAMUSCULAR | Status: AC
  Administered 2024-09-03: 1000 ug via INTRAMUSCULAR

## 2024-09-03 NOTE — Progress Notes (Signed)
 Per orders of Dr. Arlyss Solian, injection of B-12 given by Danna CINDERELLA Hummer in Left deltoid. Patient tolerated injection well. Patient will make appointment for 1 month. Pt also received high dose flu vaccine today.

## 2024-09-18 ENCOUNTER — Ambulatory Visit (INDEPENDENT_AMBULATORY_CARE_PROVIDER_SITE_OTHER): Admitting: Family Medicine

## 2024-09-18 ENCOUNTER — Encounter: Payer: Self-pay | Admitting: Family Medicine

## 2024-09-18 ENCOUNTER — Ambulatory Visit: Admitting: Family Medicine

## 2024-09-18 VITALS — BP 118/74 | HR 80 | Temp 97.9°F | Ht 61.5 in | Wt 158.4 lb

## 2024-09-18 DIAGNOSIS — M109 Gout, unspecified: Secondary | ICD-10-CM | POA: Diagnosis not present

## 2024-09-18 DIAGNOSIS — D510 Vitamin B12 deficiency anemia due to intrinsic factor deficiency: Secondary | ICD-10-CM

## 2024-09-18 DIAGNOSIS — Z7189 Other specified counseling: Secondary | ICD-10-CM | POA: Diagnosis not present

## 2024-09-18 MED ORDER — PREDNISONE 5 MG PO TABS: ORAL_TABLET | ORAL | 0 refills | Status: DC

## 2024-09-18 MED ORDER — COLCHICINE 0.6 MG PO TABS: ORAL_TABLET | ORAL | 0 refills | Status: DC

## 2024-09-18 NOTE — Patient Instructions (Addendum)
 Lab visit when possible.  Start colchicine .  If not better, then start prednisone  if needed/tolerated.  Take care.  Glad to see you.

## 2024-09-18 NOTE — Progress Notes (Signed)
 L 2nd finger pain going for about 2 weeks.   Previously with R 3rd toe pain and some R ankle pain.  She has R ankle edema at baseline.  None of the foot pain is as significant as the finger pain.   H/o difficulty taking prednisone , but not an allergy.  He is taking hydrochlorothiazide , d/w pt.  Usually rare events with gout.    D/w pt about recheck uric acid when possible, she wanted to defer today.   GFR >30.   Requests daughter to make health care decisions if patient is incapacitated.  Discussed.  H/o B12 def.  On replacement.   Meds, vitals, and allergies reviewed.   ROS: Per HPI unless specifically indicated in ROS section   Nad Ncat Neck supple, no LA Rrr Ctab Abdomen soft.  Nontender. Right ankle trace edema. L 2nd finger ttp MCP and PIP, puffy and pinkish.  She has some chronic changes on the other fingers but no other acute changes noted on the left hand.

## 2024-09-20 DIAGNOSIS — Z7189 Other specified counseling: Secondary | ICD-10-CM | POA: Insufficient documentation

## 2024-09-20 NOTE — Assessment & Plan Note (Signed)
 Discussed options. Lab visit when possible.  Start colchicine .  If not better, then start prednisone  if needed/tolerated.  She could start prednisone  at 20 mg and taper from that point.  She could also start at 5 mg of prednisone  and gradually increase the dose as needed/tolerated.  Both would be reasonable options if needed.  Steroid cautions discussed.

## 2024-09-20 NOTE — Assessment & Plan Note (Signed)
 Requests daughter to make health care decisions if patient is incapacitated.  Discussed.

## 2024-09-20 NOTE — Assessment & Plan Note (Signed)
 History of with most recent B12 level normal.  Continue replacement as is.

## 2024-09-22 ENCOUNTER — Ambulatory Visit: Admitting: Family Medicine

## 2024-09-22 ENCOUNTER — Other Ambulatory Visit: Payer: Self-pay

## 2024-09-22 ENCOUNTER — Emergency Department

## 2024-09-22 ENCOUNTER — Emergency Department
Admission: EM | Admit: 2024-09-22 | Discharge: 2024-09-24 | Disposition: A | Discharge status: Skilled Nursing Facility | Attending: Emergency Medicine | Admitting: Emergency Medicine

## 2024-09-22 ENCOUNTER — Ambulatory Visit: Payer: Self-pay

## 2024-09-22 DIAGNOSIS — I10 Essential (primary) hypertension: Secondary | ICD-10-CM | POA: Insufficient documentation

## 2024-09-22 DIAGNOSIS — S299XXA Unspecified injury of thorax, initial encounter: Secondary | ICD-10-CM | POA: Diagnosis not present

## 2024-09-22 DIAGNOSIS — R2681 Unsteadiness on feet: Secondary | ICD-10-CM | POA: Diagnosis not present

## 2024-09-22 DIAGNOSIS — Z8673 Personal history of transient ischemic attack (TIA), and cerebral infarction without residual deficits: Secondary | ICD-10-CM | POA: Insufficient documentation

## 2024-09-22 DIAGNOSIS — S79921A Unspecified injury of right thigh, initial encounter: Secondary | ICD-10-CM | POA: Diagnosis not present

## 2024-09-22 DIAGNOSIS — Z043 Encounter for examination and observation following other accident: Secondary | ICD-10-CM | POA: Diagnosis not present

## 2024-09-22 DIAGNOSIS — M47816 Spondylosis without myelopathy or radiculopathy, lumbar region: Secondary | ICD-10-CM | POA: Diagnosis not present

## 2024-09-22 DIAGNOSIS — E785 Hyperlipidemia, unspecified: Secondary | ICD-10-CM | POA: Insufficient documentation

## 2024-09-22 DIAGNOSIS — I517 Cardiomegaly: Secondary | ICD-10-CM | POA: Diagnosis not present

## 2024-09-22 DIAGNOSIS — M25551 Pain in right hip: Secondary | ICD-10-CM | POA: Insufficient documentation

## 2024-09-22 DIAGNOSIS — W1830XA Fall on same level, unspecified, initial encounter: Secondary | ICD-10-CM | POA: Insufficient documentation

## 2024-09-22 DIAGNOSIS — W19XXXA Unspecified fall, initial encounter: Secondary | ICD-10-CM | POA: Diagnosis not present

## 2024-09-22 DIAGNOSIS — M25559 Pain in unspecified hip: Secondary | ICD-10-CM | POA: Diagnosis not present

## 2024-09-22 DIAGNOSIS — G9389 Other specified disorders of brain: Secondary | ICD-10-CM | POA: Diagnosis not present

## 2024-09-22 DIAGNOSIS — M6281 Muscle weakness (generalized): Secondary | ICD-10-CM | POA: Insufficient documentation

## 2024-09-22 DIAGNOSIS — I700 Atherosclerosis of aorta: Secondary | ICD-10-CM | POA: Diagnosis not present

## 2024-09-22 DIAGNOSIS — M8580 Other specified disorders of bone density and structure, unspecified site: Secondary | ICD-10-CM | POA: Diagnosis not present

## 2024-09-22 DIAGNOSIS — S0990XA Unspecified injury of head, initial encounter: Secondary | ICD-10-CM | POA: Diagnosis present

## 2024-09-22 DIAGNOSIS — G238 Other specified degenerative diseases of basal ganglia: Secondary | ICD-10-CM | POA: Diagnosis not present

## 2024-09-22 DIAGNOSIS — M1711 Unilateral primary osteoarthritis, right knee: Secondary | ICD-10-CM | POA: Diagnosis not present

## 2024-09-22 MED ORDER — CALCIUM-VITAMIN D-VITAMIN K 500-100-40 MG-UNT-MCG PO CHEW: 1.0000 | CHEWABLE_TABLET | Freq: Every day | ORAL | Status: DC

## 2024-09-22 MED ORDER — CENTRUM SILVER PO TABS: 1.0000 | ORAL_TABLET | Freq: Every day | ORAL | Status: DC

## 2024-09-22 MED ORDER — ROSUVASTATIN CALCIUM 5 MG PO TABS
5.0000 mg | ORAL_TABLET | Freq: Every day | ORAL | Status: DC
  Administered 2024-09-23 – 2024-09-24 (×2): 5 mg via ORAL
  Filled 2024-09-22 (×2): qty 1

## 2024-09-22 MED ORDER — ALIGN 4 MG PO CAPS: 1.0000 | ORAL_CAPSULE | Freq: Every day | ORAL | Status: DC

## 2024-09-22 MED ORDER — ACETAMINOPHEN 325 MG PO TABS
650.0000 mg | ORAL_TABLET | Freq: Three times a day (TID) | ORAL | Status: DC
  Administered 2024-09-22 – 2024-09-24 (×3): 650 mg via ORAL
  Filled 2024-09-22 (×3): qty 2

## 2024-09-22 MED ORDER — ASPIRIN 81 MG PO TBEC
81.0000 mg | DELAYED_RELEASE_TABLET | Freq: Every day | ORAL | Status: DC
  Administered 2024-09-22 – 2024-09-24 (×3): 81 mg via ORAL
  Filled 2024-09-22 (×3): qty 1

## 2024-09-22 MED ORDER — POLYETHYLENE GLYCOL 3350 17 G PO PACK: 17.0000 g | PACK | Freq: Every day | ORAL | Status: DC | PRN

## 2024-09-22 MED ORDER — HYDROCHLOROTHIAZIDE 25 MG PO TABS
25.0000 mg | ORAL_TABLET | Freq: Every day | ORAL | Status: DC
  Administered 2024-09-22 – 2024-09-24 (×2): 25 mg via ORAL
  Filled 2024-09-22 (×2): qty 1

## 2024-09-22 NOTE — ED Notes (Signed)
 Resumed care from les paramedic.  Pt alert in hallway bed.

## 2024-09-22 NOTE — ED Provider Notes (Signed)
-----------------------------------------   4:30 PM on 09/22/2024 ----------------------------------------- I have personally seen and evaluated the patient.  She is suffering from right hip pain after a fall but her x-rays are negative.  Daughter is here with the patient states she has had significant difficulty ambulating even before the fall and now even more so.  Unable to ambulate safely without significant assistance.  Daughter does not believe the patient can go home safely which I agree.  Will have PT and social work evaluate to see if the patient may benefit from home health needs versus short-term rehab placement.   Dorothyann Drivers, MD 09/22/24 [phone]

## 2024-09-22 NOTE — ED Triage Notes (Signed)
 Pt to ED via [facility] from home. Pt reports fall on Monday onto right side/leg. Pt reports continued right hip pain. No shortening or rotation. Pt has been ambulatory after fall but more difficulty today.

## 2024-09-22 NOTE — Discharge Instructions (Addendum)
 You were seen in the emergency department for right hip pain after a fall.  Please take Tylenol  as needed based on instructions on the bottle for your pain.  Please do not ambulate with a walker until your pain is healed.  I hope you are able to get a home health care aide versus placement in a short-term facility to help with your concerns today.  Please follow-up with your primary care provider following today's visit.

## 2024-09-22 NOTE — ED Provider Notes (Addendum)
 [facility] Provider Note    Event Date/Time   First MD Initiated Contact with Patient 09/22/24 1216     (approximate)   History   Fall and Hip Pain (/)   HPI  Mallory Rodriguez is a 89 y.o. female  with a past medical history of hypertension, hyperlipidemia, CVA, Hx ORIF right hip presents to the emergency department with right hip pain after a fall yesterday.  Patient reports she was standing looking out the bathroom window when she turned around too fast and fell into the laundry basket and hit her right hip on the floor. Reports she hit the mid-back portion of her head on the laundry basket.  Denies loss of consciousness, vision changes, headache, vomiting, dizziness, numbness or weakness in her arms or legs, chest pain, dyspnea, abdominal pain, neck pain.  Patient normally uses a walker at home to ambulate.  Patient has only had 1 fall in the past year.  Patient's daughter showed up in the room later during the visit and asked about possible SNF placement versus home health care due to patient being home by herself at times and with an unsteady gait at times. Daughter is out of the home intermittently and has concerns regarding transfers to the bathroom and ambulation at home.   Physical Exam   Triage Vital Signs: ED Triage Vitals [09/22/24 1157]  Encounter Vitals Group     BP (!) 140/92     Girls Systolic BP Percentile      Girls Diastolic BP Percentile      Boys Systolic BP Percentile      Boys Diastolic BP Percentile      Pulse Rate 86     Resp 18     Temp 98.2 F (36.8 C)     Temp Source Oral     SpO2 99 %     Weight      Height      Head Circumference      Peak Flow      Pain Score 8     Pain Loc      Pain Education      Exclude from Growth Chart     Most recent vital signs: Vitals:   09/22/24 1157  BP: (!) 140/92  Pulse: 86  Resp: 18  Temp: 98.2 F (36.8 C)  SpO2: 99%    General: Awake, in no acute distress. Appears stated  age. Head: Normocephalic, atraumatic. No evidence of scalp hematoma or open wound. Eyes: PERRLA. EOMs intact. No scleral icterus or conjunctival injection. Neck: Supple, no lymphadenopathy, no nuchal rigidity. CV: Good peripheral perfusion. No edema.  Respiratory:Normal respiratory effort.  No respiratory distress. CTAB. GI: Soft, non-distended, non-tender.  MSK: TTP along lateral aspect of right hip in IT band distribution, pain enhanced with hip external rotation. Normal ROM of knees and ankles. Skin:Warm, dry, intact. No rashes, lesions, or ecchymosis. No cyanosis or pallor. Neurological: A&Ox4 to person, place, time, and situation. Cranial nerves II-XII grossly intact. Sensation intact. Strength symmetric. No focal deficits.  Psychiatric: Mood and affect appropriate. Thought processes coherent.   ED Results / Procedures / Treatments   Labs (all labs ordered are listed, but only abnormal results are displayed) Labs Reviewed - No data to display   EKG     RADIOLOGY CXR with ribs, pelvis, femur x rays, and CT head ordered.   PROCEDURES:  Critical Care performed: No   Procedures   MEDICATIONS ORDERED IN ED: Medications  acetaminophen  (  TYLENOL ) tablet 650 mg (650 mg Oral Given 09/22/24 1621)  aspirin  EC tablet 81 mg (81 mg Oral Given 09/22/24 1621)  hydrochlorothiazide  (HYDRODIURIL ) tablet 25 mg (25 mg Oral Given 09/22/24 1638)  rosuvastatin  (CRESTOR ) tablet 5 mg (has no administration in time range)     IMPRESSION / MDM / ASSESSMENT AND PLAN / ED COURSE  I reviewed the triage vital signs and the nursing notes.                              Differential diagnosis includes, but is not limited to, hip contusion, hip fracture, hip dislocation, epidural or subdural hematoma  Patient's presentation is most consistent with acute complicated illness / injury requiring diagnostic workup.  Patient is a 89 year old female presenting with right hip pain after a fall.  Chest x-ray  negative for rib fracture, pneumothorax.  CT head without any acute intracranial abnormality including no skull fracture or hemorrhage. Pelvis and femur x-rays with no acute fracture or displacement of the prior surgical hardware of the right hip ORIF completed in 2018.  Discussed patient with supervising physician Dr. Kevin Paduchowski, for potential placement/boarding due to daughter's concerns with ambulation at home.  Daughter stated patient has trouble ambulating without assistance even before the fall that occurred yesterday.  On my exam, she was not able to bear weight on her leg without significant pain.  Offered CT scan of the hip for better imaging, but daughter and patient declined.  Did place her as a boarder for PT and social work evaluation to see if she may need home health care for short term skilled nursing facility/rehab. Home meds ordered for administration as prescribed outpatient.  PT notes shows patient would receive benefit from skilled PT services.  FINAL CLINICAL IMPRESSION(S) / ED DIAGNOSES   Final diagnoses:  Fall, initial encounter  Right hip pain     Rx / DC Orders   ED Discharge Orders     None        Note:  This document was prepared using Dragon voice recognition software and may include unintentional dictation errors.     Sheron Salm, PA-C 09/22/24 1820    [address] [place], PA-C 09/22/24 [address], [place], PA-C 09/22/24 [place] Dorothyann Drivers, MD 09/22/24 2112

## 2024-09-22 NOTE — Evaluation (Signed)
 Physical Therapy Evaluation Patient Details Name: Mallory Rodriguez MRN: [id number] DOB: Nov 15, 1933 Today's Date: 09/22/2024  History of Present Illness  Pt admitted for fall on R side and inability to ambulate. Imaging negative at this time.  Clinical Impression  Pt is a pleasant 89 year old female who was admitted for fall at home and inability to ambulate safely. Pt performs transfers with min assist and ambulation with mod assist x 1 foot using RW. Unable to further progress gait due to pain. Pt demonstrates deficits with strength/mobility/pain. Pt is currently not at baseline level. Would benefit from skilled PT to address above deficits and promote optimal return to PLOF. Pt will continue to receive skilled PT services while admitted and will defer to TOC/care team for updates regarding disposition planning.       If plan is discharge home, recommend the following: A lot of help with walking and/or transfers;A lot of help with bathing/dressing/bathroom;Assist for transportation;Help with stairs or ramp for entrance;Supervision due to cognitive status   Can travel by private vehicle   No    Equipment Recommendations Rolling walker (2 wheels)  Recommendations for Other Services       Functional Status Assessment Patient has had a recent decline in their functional status and demonstrates the ability to make significant improvements in function in a reasonable and predictable amount of time.     Precautions / Restrictions Precautions Precautions: Fall Recall of Precautions/Restrictions: Intact Restrictions Weight Bearing Restrictions Per Provider Order: No      Mobility  Bed Mobility               General bed mobility comments: NT, received in chair    Transfers Overall transfer level: Needs assistance Equipment used: Rolling walker (2 wheels) Transfers: Sit to/from Stand Sit to Stand: Min assist           General transfer comment: needs cues for sequencing     Ambulation/Gait Ambulation/Gait assistance: Mod assist Gait Distance (Feet): 1 Feet Assistive device: Rolling walker (2 wheels) Gait Pattern/deviations: Step-to pattern       General Gait Details: very short 1-2 steps with increased pain with WBing on R side making it difficult for her to advance her L leg  Stairs            Wheelchair Mobility     Tilt Bed    Modified Rankin (Stroke Patients Only)       Balance Overall balance assessment: History of Falls, Needs assistance Sitting-balance support: Feet supported Sitting balance-Leahy Scale: Fair     Standing balance support: Bilateral upper extremity supported Standing balance-Leahy Scale: Fair                               Pertinent Vitals/Pain Pain Assessment Pain Assessment: 0-10 Pain Score: 10-Worst pain ever Pain Location: R hip Pain Descriptors / Indicators: Operative site guarding Pain Intervention(s): Limited activity within patient's tolerance, Repositioned    Home Living Family/patient expects to be discharged to:: Private residence Living Arrangements: Spouse/significant other;Children Available Help at Discharge: Family;Available 24 hours/day Type of Home: House Home Access: Stairs to enter Entrance Stairs-Rails: Can reach both Entrance Stairs-Number of Steps: 3   Home Layout: Able to live on main level with bedroom/bathroom Home Equipment: Rollator (4 wheels);Cane - single point      Prior Function Prior Level of Function : Needs assist             Mobility  Comments: was previously using rollator, no other falls in past 6 months ADLs Comments: indep     Extremity/Trunk Assessment   Upper Extremity Assessment Upper Extremity Assessment: Overall WFL for tasks assessed    Lower Extremity Assessment Lower Extremity Assessment: Generalized weakness (R LE pain with mobility)       Communication   Communication Communication: No apparent difficulties     Cognition Arousal: Alert Behavior During Therapy: WFL for tasks assessed/performed   PT - Cognitive impairments: History of cognitive impairments                       PT - Cognition Comments: pleasant, family assisted with answering questions Following commands: Intact       Cueing Cueing Techniques: Verbal cues     General Comments      Exercises     Assessment/Plan    PT Assessment Patient needs continued PT services  PT Problem List Decreased strength;Decreased balance;Decreased mobility;Decreased activity tolerance;Decreased cognition;Decreased knowledge of use of DME;Decreased safety awareness;Pain       PT Treatment Interventions Gait training;DME instruction;Therapeutic activities;Therapeutic exercise;Balance training    PT Goals (Current goals can be found in the Care Plan section)  Acute Rehab PT Goals Patient Stated Goal: to go back home PT Goal Formulation: With patient Time For Goal Achievement: 10/06/24 Potential to Achieve Goals: Good    Frequency Min 2X/week     Co-evaluation               AM-PAC PT 6 Clicks Mobility  Outcome Measure Help needed turning from your back to your side while in a flat bed without using bedrails?: A Little Help needed moving from lying on your back to sitting on the side of a flat bed without using bedrails?: A Little Help needed moving to and from a bed to a chair (including a wheelchair)?: A Lot Help needed standing up from a chair using your arms (e.g., wheelchair or bedside chair)?: A Lot Help needed to walk in hospital room?: Total Help needed climbing 3-5 steps with a railing? : Total 6 Click Score: 12    End of Session   Activity Tolerance: Patient limited by pain Patient left:  (in transport chair with family at bedside) Nurse Communication: Mobility status PT Visit Diagnosis: Muscle weakness (generalized) (M62.81);Difficulty in walking, not elsewhere classified (R26.2);History of falling  (Z91.81);Pain;Unsteadiness on feet (R26.81) Pain - Right/Left: Right Pain - part of body: Hip    Time: 8383-8366 PT Time Calculation (min) (ACUTE ONLY): 17 min   Charges:   PT Evaluation $PT Eval Low Complexity: 1 Low PT Treatments $Therapeutic Activity: 8-22 mins PT General Charges $$ ACUTE PT VISIT: 1 Visit         Corean Dade, PT, DPT, GCS [phone]   Kasie Leccese 09/22/2024, 5:23 PM

## 2024-09-22 NOTE — Telephone Encounter (Signed)
 Per EMR, at ER currently.

## 2024-09-22 NOTE — ED Notes (Signed)
 At this time, this EDT and RN cleaned up pt after calling out for a BM. We provided a full linen change, pt is clean and dry.

## 2024-09-22 NOTE — Telephone Encounter (Signed)
 FYI Only or Action Required?: FYI only for provider.  Patient was last seen in primary care on 09/18/2024 by Cleatus Arlyss RAMAN, MD.  Called Nurse Triage reporting Fall.  Symptoms began several days ago.  Interventions attempted: Rest, hydration, or home remedies.  Symptoms are: gradually worsening.  Triage Disposition: See HCP Within 4 Hours (Or PCP Triage)  Patient/caregiver understands and will follow disposition?: Yes, will follow disposition  Copied from CRM [id number]. Topic: Clinical - Red Word Triage >> Sep 22, 2024  9:12 AM Ismael A wrote: Red Word that prompted transfer to Nurse Triage: patient's daughter states her mother fell on Friday but she is having trouble walking since and in pain. Patient was experiencing lameness prior to falling but it has worsened since Reason for Disposition  [1] SEVERE pain (e.g., excruciating, unable to do any normal activities) AND [2] not improved after 2 hours of pain medicine  Answer Assessment - Initial Assessment Questions 1. ONSET: When did the pain start?      3 days ago, after fall,  2. LOCATION: Where is the pain located?      R side thigh, pt has hx of R hip and femur fx.  3. PAIN: How bad is the pain?    (Scale 1-10; or mild, moderate, severe)     Moderate-severe 4. WORK OR EXERCISE: Has there been any recent work or exercise that involved this part of the body?      Fell 3 days ago 6. OTHER SYMPTOMS: Do you have any other symptoms? (e.g., chest pain, back pain, breathing difficulty, swelling, rash, fever, numbness, weakness)     denies  Protocols used: Leg Pain-A-AH

## 2024-09-22 NOTE — ED Notes (Signed)
 Patient called out for having a BM. This RN and NT cleaned up patient and provided total linen change.

## 2024-09-23 DIAGNOSIS — M25551 Pain in right hip: Secondary | ICD-10-CM | POA: Diagnosis not present

## 2024-09-23 NOTE — ED Notes (Signed)
 This tech put pt on bedpan so that pt could urinate, this tech got pt off the bedpan and got the chuck from under pt because it was wet. Pt has no other needs at this time.

## 2024-09-23 NOTE — ED Notes (Signed)
 Patient woke up.  Tech put Pt on the bed pan.  I asked pt was she in pain and she said just a little bit.  I advised her that she had tylenol  due at 2200 and asked if she wanted it now.  She said no, she watched a tv show that she shouldn't take tylenol  so much.

## 2024-09-23 NOTE — TOC Initial Note (Addendum)
 Transition of Care [facility]) - Initial/Assessment Note    Patient Details  Name: Mallory Rodriguez MRN: [id number] Date of Birth: 1933/09/12  Transition of Care [facility]) CM/SW Contact:    Edsel DELENA Fischer, LCSW Phone Number: 09/23/2024, 11:29 AM  Clinical Narrative:                  TOC met with pt at beside and later pt daughter arrived. Pt gave verbal permission for TOC to speak with daughter. PCP: Dr. Cleatus and Pharmacy:Unsure of name.  HH: no.  Pt stated that she doesn't drive and will need assistance to rehab facility.  Daughter 1st preference is [facility] and 2nd [facility] in [place], [place].  Pt stated that her family is he support.  No financial issues or issues paying for meds.  FL2 completed and submitted to preferences.  Waiting on response.   TOC messaged [facility] regarding waiver.  Pt is eligible for waiver.    Expected Discharge Plan: Skilled Nursing Facility Barriers to Discharge: Continued Medical Work up   Patient Goals and CMS Choice Patient states their goals for this hospitalization and ongoing recovery are:: rehab and then return home CMS Medicare.gov Compare Post Acute Care list provided to::  (Pt daughter provided preferences) Choice offered to / list presented to :  (pt daughter provided preferences)      Expected Discharge Plan and Services In-house Referral: Clinical Social Work   Post Acute Care Choice: Skilled Nursing Facility Living arrangements for the past 2 months: Single Family Home                                      Prior Living Arrangements/Services Living arrangements for the past 2 months: Single Family Home Lives with:: Adult Children, Spouse Patient language and need for interpreter reviewed:: Yes Do you feel safe going back to the place where you live?: Yes (after rehab)      Need for Family Participation in Patient Care: Yes (Comment) Care giver support system in place?: Yes (comment) Current home services: DME (DME: walker) Criminal  Activity/Legal Involvement Pertinent to Current Situation/Hospitalization: No - Comment as needed  Activities of Daily Living      Permission Sought/Granted Permission sought to share information with : Family Supports Camera operator) Permission granted to share information with : Yes, Verbal Permission Granted  Share Information with NAME: Schwartz,Lateya           Emotional Assessment Appearance:: Appears stated age Attitude/Demeanor/Rapport: Engaged, Gracious Affect (typically observed): Accepting, Appropriate Orientation: : Oriented to Self, Oriented to Place, Oriented to Situation, Oriented to  Time   Psych Involvement: No (comment)  Admission diagnosis:  Fall; Hip Pain Patient Active Problem List   Diagnosis Date Noted   Advance care planning 09/20/2024   Stage 3a chronic kidney disease (HCC) 04/23/2022   Gout 08/24/2021   Hemiplegia of nondominant side, late effect of cerebrovascular disease (HCC) 08/17/2015   Tremor 11/30/2014   Allergic rhinitis due to pollen 05/18/2013   Routine general medical examination at a health care facility 11/18/2012   Osteoarthritis, multiple sites 11/07/2010   Hyperlipemia 11/02/2010   Pernicious anemia 11/02/2010   HYPERTENSION, BENIGN 02/09/2010   Mitral valve disorder 02/09/2010   PCP:  Cleatus Arlyss RAMAN, MD Pharmacy:   [facility] [phone] [address], [facility] - [address] [place] [address] Phone: [phone] Fax: [phone]  [id number] - [place], [place] -  [address] [place] [address] Phone: [phone] Fax: [phone]     Social Drivers of Health (SDOH) Social History: SDOH Screenings   Food Insecurity: No Food Insecurity (08/07/2024)  Housing: Unknown (08/07/2024)  Transportation Needs: No Transportation Needs (08/07/2024)  Utilities: Not At Risk (08/07/2024)  Alcohol  Screen: Low Risk  (08/07/2024)  Depression (PHQ2-9): Low Risk  (08/24/2024)  Financial  Resource Strain: Low Risk  (08/07/2024)  Physical Activity: Inactive (08/07/2024)  Social Connections: Socially Isolated (08/07/2024)  Stress: No Stress Concern Present (08/07/2024)  Tobacco Use: Low Risk  (09/22/2024)  Health Literacy: Inadequate Health Literacy (08/07/2024)   SDOH Interventions:     Readmission Risk Interventions     No data to display

## 2024-09-23 NOTE — ED Notes (Signed)
 This tech assisted pt onto bedpan. Pt able to urinate. Pt cleaned up and peri care provided, pt placed in clean brief. Pt with no further needs at this time.

## 2024-09-23 NOTE — ED Provider Notes (Signed)
 Emergency Medicine Observation Re-evaluation Note  Mallory Rodriguez is a 89 y.o. female, seen on rounds today.  Pt initially presented to the ED for complaints of Fall and Hip Pain (/) Currently, the patient is resting in bed.  Physical Exam  BP (!) 154/77   Pulse 77   Temp 98.2 F (36.8 C) (Oral)   Resp 18   SpO2 100%  Physical Exam General: No distress Cardiac: Well-perfused Lungs: Normal respiratory effort Psych: Calm  ED Course / MDM   There been no significant changes to the clinical status in the last 24 hours.  Plan  Current plan is for TOC disposition.    Jacolyn Pae, MD 09/23/24 2153

## 2024-09-23 NOTE — ED Notes (Signed)
Report received from Cathy, RN.

## 2024-09-23 NOTE — [place] FL2 (Signed)
 [place]  MEDICAID [id number] LEVEL OF CARE FORM     IDENTIFICATION  Patient Name: Estella Malatesta Birthdate: [birth year] Sex: female Admission Date (Current Location): 09/22/2024  [facility] and [place] Number:  Chiropodist and Address:  [facility], [address] [place], [place] [address]      Provider Number: [id number]  Attending Physician Name and Address:  Viviann Pastor, MD  Relative Name and Phone Number:       Current Level of Care: Hospital Recommended Level of Care: Skilled Nursing Facility Prior Approval Number:    Date Approved/Denied:   PASRR Number: [id number]  Discharge Plan: SNF    Current Diagnoses: Patient Active Problem List   Diagnosis Date Noted   Advance care planning 09/20/2024   Stage 3a chronic kidney disease (HCC) 04/23/2022   Gout 08/24/2021   Hemiplegia of nondominant side, late effect of cerebrovascular disease (HCC) 08/17/2015   Tremor 11/30/2014   Allergic rhinitis due to pollen 05/18/2013   Routine general medical examination at a health care facility 11/18/2012   Osteoarthritis, multiple sites 11/07/2010   Hyperlipemia 11/02/2010   Pernicious anemia 11/02/2010   HYPERTENSION, BENIGN 02/09/2010   Mitral valve disorder 02/09/2010    Orientation RESPIRATION BLADDER Height & Weight     Self, Time, Situation, Place  Normal Continent Weight:   Height:     BEHAVIORAL SYMPTOMS/MOOD NEUROLOGICAL BOWEL NUTRITION STATUS      Continent Diet  AMBULATORY STATUS COMMUNICATION OF NEEDS Skin   Limited Assist Verbally Normal                       Personal Care Assistance Level of Assistance  Bathing, Dressing, Feeding Bathing Assistance: Limited assistance Feeding assistance: Independent Dressing Assistance: Limited assistance     Functional Limitations Info  Sight, Hearing, Speech Sight Info: Adequate (wears glasses) Hearing Info: Adequate Speech Info: Adequate    SPECIAL CARE FACTORS FREQUENCY   PT (By licensed PT)     PT Frequency: 5x a week              Contractures Contractures Info: Not present    Additional Factors Info  Code Status, Allergies   Allergies Info: Bee Venom,Contrast Media (iodinated Contrast Media),Lipitor (atorvastatin),Ace Inhibitors,Hydrocodone ,Macrodantin ,Neosporin (neomycin-bacitracin Zn-polymyx),Sulfa Antibiotics,Epinephrine ,Prednisone            Current Medications (09/23/2024):  This is the current hospital active medication list Current Facility-Administered Medications  Medication Dose Route Frequency Provider Last Rate Last Admin   acetaminophen  (TYLENOL ) tablet 650 mg  650 mg Oral TID Sheron, Summer, PA-C   650 mg at 09/22/24 1621   aspirin  EC tablet 81 mg  81 mg Oral Daily Dunlap, Summer, PA-C   81 mg at 09/22/24 1621   hydrochlorothiazide  (HYDRODIURIL ) tablet 25 mg  25 mg Oral Daily Dunlap, Summer, PA-C   25 mg at 09/22/24 1638   rosuvastatin  (CRESTOR ) tablet 5 mg  5 mg Oral Daily Sheron, Summer, PA-C       Current Outpatient Medications  Medication Sig Dispense Refill   acetaminophen  (TYLENOL ) 650 MG CR tablet Take 650 mg by mouth 3 (three) times daily.     aspirin  EC 81 MG tablet Take 81 mg by mouth daily.     CALCIUM -VITAMIN D -VITAMIN K  PO Take 1 each by mouth daily.     colchicine  0.6 MG tablet Take 2 tabs (1.2mg ) first dose. Then can take 1 tablet 0.6mg  daily thereafter until gout flare resolved. 30 tablet 0   cyanocobalamin  (VITAMIN B12)  1000 MCG/ML injection Inject 1,000 mcg into the muscle every 30 (thirty) days.     EPINEPHrine  (EPIPEN  JR) 0.15 MG/0.3ML injection INJECT ONE SYRINGEFUL INTRAMUSCULARLY AS NEEDED FOR ANAPHYLAXIS 2 each 0   hydrochlorothiazide  (HYDRODIURIL ) 25 MG tablet TAKE 1 TABLET (25 MG TOTAL) BY MOUTH DAILY. 90 tablet 3   Multiple Vitamins-Minerals (CENTRUM SILVER  PO) Take by mouth.     Polyethyl Glycol-Propyl Glycol (SYSTANE OP) Apply 1 drop to eye as needed (for dry eyes).     polyethylene glycol (MIRALAX  /  GLYCOLAX ) packet Take 17 g by mouth daily as needed for mild constipation. 14 each 0   Probiotic Product (ALIGN) 4 MG CAPS Take 1 capsule by mouth daily.     rosuvastatin  (CRESTOR ) 5 MG tablet TAKE 1 TABLET (5 MG TOTAL) BY MOUTH DAILY. 90 tablet 3   predniSONE  (DELTASONE ) 5 MG tablet 20mg  for 3 days, then 15mg  for 3 days, then 10mg  for 3 days, then 5mg  for 3 days.   Don't take ibuprofen  or aleve .  Take with food. (Patient not taking: Reported on 09/22/2024) 30 tablet 0     Discharge Medications: Please see discharge summary for a list of discharge medications.  Relevant Imaging Results:  Relevant Lab Results:   Additional Information SSN: [id number], DOB 1933/08/01  Edsel DELENA Fischer, LCSW

## 2024-09-23 NOTE — TOC CM/SW Note (Signed)
..  Transition of Care [facility]) - Inpatient Brief Assessment   Patient Details  Name: Mallory Rodriguez MRN: [id number] Date of Birth: 1933-12-08  Transition of Care [facility]) CM/SW Contact:    Edsel DELENA Fischer, LCSW Phone Number: 09/23/2024, 6:24 PM   Clinical Narrative:  TOC received message that pt daughter wanted a call. TOC called daughter.  No answer. TOC left message  Transition of Care Asessment:

## 2024-09-23 NOTE — ED Notes (Signed)
 This RN with Donny RN put pt on bedpan. Pt tolerated activity well. Pt had BM. This RN with Donny RN clean pt and, removed bedpan, and helped pt get back to a comfortable position.

## 2024-09-23 NOTE — ED Notes (Signed)
 Pt called out that she needed to urinate. Pt helped onto bed pan, then peri care provided and new brief placed once pt was done. Pt given eye mask to assist pt with sleeping while in the hall. Pt has no other needs at this time.

## 2024-09-23 NOTE — ED Provider Notes (Signed)
 Emergency Medicine Observation Re-evaluation Note  Mallory Rodriguez is a 89 y.o. female, seen on rounds today.  Pt initially presented to the ED for complaints of Fall and Hip Pain (/) Currently, the patient is resting.  Physical Exam  BP (!) 140/92 (BP Location: Right Arm)   Pulse 86   Temp 98.2 F (36.8 C) (Oral)   Resp 18   SpO2 99%  Physical Exam .Gen:  No acute distress Resp:  Breathing easily and comfortably, no accessory muscle usage Neuro:  Moving all four extremities, no gross focal neuro deficits Psych:  Resting currently, calm when awake   ED Course / MDM  EKG:   I have reviewed the labs performed to date as well as medications administered while in observation.  Recent changes in the last 24 hours include no acute events.  Plan  Current plan is for PT and SW dispo.    Waymond Lorelle Cummins, MD 09/23/24 [phone]

## 2024-09-23 NOTE — ED Notes (Signed)
 Pt given meal tray and water. Pt denies needs at this time.

## 2024-09-23 NOTE — ED Notes (Signed)
 This RN with Donny RN put pt on bedpan. Pt tolerated activity well. Pt urinated. This RN with Donny RN clean pt and, removed bedpan, and helped pt get back to a comfortable position. Pt requested coffee and Donny PEAK provided it to her.

## 2024-09-24 DIAGNOSIS — R278 Other lack of coordination: Secondary | ICD-10-CM | POA: Diagnosis not present

## 2024-09-24 DIAGNOSIS — I10 Essential (primary) hypertension: Secondary | ICD-10-CM | POA: Diagnosis not present

## 2024-09-24 DIAGNOSIS — R2681 Unsteadiness on feet: Secondary | ICD-10-CM | POA: Diagnosis not present

## 2024-09-24 DIAGNOSIS — M109 Gout, unspecified: Secondary | ICD-10-CM | POA: Diagnosis not present

## 2024-09-24 DIAGNOSIS — D510 Vitamin B12 deficiency anemia due to intrinsic factor deficiency: Secondary | ICD-10-CM | POA: Diagnosis not present

## 2024-09-24 DIAGNOSIS — Z741 Need for assistance with personal care: Secondary | ICD-10-CM | POA: Diagnosis not present

## 2024-09-24 DIAGNOSIS — Z8673 Personal history of transient ischemic attack (TIA), and cerebral infarction without residual deficits: Secondary | ICD-10-CM | POA: Diagnosis not present

## 2024-09-24 DIAGNOSIS — R2689 Other abnormalities of gait and mobility: Secondary | ICD-10-CM | POA: Diagnosis not present

## 2024-09-24 DIAGNOSIS — M1000 Idiopathic gout, unspecified site: Secondary | ICD-10-CM | POA: Diagnosis not present

## 2024-09-24 DIAGNOSIS — Z7401 Bed confinement status: Secondary | ICD-10-CM | POA: Diagnosis not present

## 2024-09-24 DIAGNOSIS — M25551 Pain in right hip: Secondary | ICD-10-CM | POA: Diagnosis not present

## 2024-09-24 DIAGNOSIS — N1831 Chronic kidney disease, stage 3a: Secondary | ICD-10-CM | POA: Diagnosis not present

## 2024-09-24 DIAGNOSIS — E785 Hyperlipidemia, unspecified: Secondary | ICD-10-CM | POA: Diagnosis not present

## 2024-09-24 DIAGNOSIS — M6281 Muscle weakness (generalized): Secondary | ICD-10-CM | POA: Diagnosis not present

## 2024-09-24 DIAGNOSIS — Z9181 History of falling: Secondary | ICD-10-CM | POA: Diagnosis not present

## 2024-09-24 DIAGNOSIS — M1990 Unspecified osteoarthritis, unspecified site: Secondary | ICD-10-CM | POA: Diagnosis not present

## 2024-09-24 DIAGNOSIS — G8190 Hemiplegia, unspecified affecting unspecified side: Secondary | ICD-10-CM | POA: Diagnosis not present

## 2024-09-24 DIAGNOSIS — I129 Hypertensive chronic kidney disease with stage 1 through stage 4 chronic kidney disease, or unspecified chronic kidney disease: Secondary | ICD-10-CM | POA: Diagnosis not present

## 2024-09-24 NOTE — Progress Notes (Signed)
 Physical Therapy Treatment Patient Details Name: Mallory Rodriguez MRN: [id number] DOB: [birth year] Today's Date: 09/24/2024   History of Present Illness Pt admitted for fall on R side and inability to ambulate. Imaging negative at this time.    PT Comments  Pt was supine in bed upon arrival. She is alert and pleasant. Only truly oriented x 3 but easily able to follow simple commands with increased time. Pt presents with fear of falling in standing however puts forth great effort. She was able to stand EOB 4 x to RW. Session focused o improving wt acceptance and strength on RLE. Weakness and pain still limiting pt's progress towards goals. R knee blocked during LLE advancement. Overall pt tolerates session well but will continue to benefit from skilled PT at DC to maximize independence and safety with all ADLs.    If plan is discharge home, recommend the following: A lot of help with walking and/or transfers;A lot of help with bathing/dressing/bathroom;Assist for transportation;Help with stairs or ramp for entrance;Supervision due to cognitive status     Equipment Recommendations  Rolling walker (2 wheels)       Precautions / Restrictions Precautions Precautions: Fall Recall of Precautions/Restrictions: Intact Restrictions Weight Bearing Restrictions Per Provider Order: No     Mobility  Bed Mobility Overal bed mobility: Needs Assistance Bed Mobility: Supine to Sit, Sit to Supine  Supine to sit: Min assist Sit to supine: Min assist   Transfers Overall transfer level: Needs assistance Equipment used: Rolling walker (2 wheels) Transfers: Sit to/from Stand Sit to Stand: Min assist, Mod assist  General transfer comment: Pt was able to stand 4 x EOB from elevated gurney high. does require more assistance to return to supine due to elevated surface height with pt's short stature.    Ambulation/Gait Ambulation/Gait assistance: Mod assist Gait Distance (Feet): 3 Feet Assistive device:  Rolling walker (2 wheels) Gait Pattern/deviations: Step-to pattern Gait velocity: decreased  General Gait Details: R knee weakness/pain limits patients ability to safely ambulate. She requires R knee blocking at atime to progress LLE. poor R lateral wt shift    Balance Overall balance assessment: History of Falls, Needs assistance Sitting-balance support: Feet supported Sitting balance-Leahy Scale: Fair     Standing balance support: Bilateral upper extremity supported, During functional activity, Reliant on assistive device for balance Standing balance-Leahy Scale: Poor Standing balance comment: pt remains at high risk of falls due to weakness, balance, and poor wt acceptance on RLE       Communication Communication Communication: No apparent difficulties  Cognition Arousal: Alert Behavior During Therapy: WFL for tasks assessed/performed   PT - Cognitive impairments: History of cognitive impairments    PT - Cognition Comments: Pt is A and O x 3. Agreeable and pleasnat. slightly anxious with mobility Following commands: Intact      Cueing Cueing Techniques: Verbal cues, Tactile cues         Pertinent Vitals/Pain Pain Assessment Pain Assessment: 0-10 Pain Score: 4  Pain Location: R hip Pain Descriptors / Indicators: Aching, Discomfort, Sore Pain Intervention(s): Limited activity within patient's tolerance, Monitored during session, Premedicated before session, Repositioned    Home Living Family/patient expects to be discharged to:: Private residence Living Arrangements: Spouse/significant other;Children Available Help at Discharge: Family;Available 24 hours/day Type of Home: House Home Access: Stairs to enter Entrance Stairs-Rails: Can reach both Entrance Stairs-Number of Steps: 3   Home Layout: Able to live on main level with bedroom/bathroom Home Equipment: Rollator (4 wheels);Cane - single point  PT Goals (current goals can now be found in the care plan  section) Acute Rehab PT Goals Patient Stated Goal: to go back home Progress towards PT goals: Progressing toward goals    Frequency    Min 2X/week       AM-PAC PT 6 Clicks Mobility   Outcome Measure  Help needed turning from your back to your side while in a flat bed without using bedrails?: A Little Help needed moving from lying on your back to sitting on the side of a flat bed without using bedrails?: A Little Help needed moving to and from a bed to a chair (including a wheelchair)?: A Lot Help needed standing up from a chair using your arms (e.g., wheelchair or bedside chair)?: A Lot Help needed to walk in hospital room?: A Lot Help needed climbing 3-5 steps with a railing? : Total 6 Click Score: 13    End of Session   Activity Tolerance: Patient tolerated treatment well Patient left: in bed;with call bell/phone within reach Nurse Communication: Mobility status PT Visit Diagnosis: Muscle weakness (generalized) (M62.81);Difficulty in walking, not elsewhere classified (R26.2);History of falling (Z91.81);Pain;Unsteadiness on feet (R26.81) Pain - Right/Left: Right Pain - part of body: Hip     Time: 1010-1031 PT Time Calculation (min) (ACUTE ONLY): 21 min  Charges:    $Therapeutic Activity: 8-22 mins PT General Charges $$ ACUTE PT VISIT: 1 Visit                    Rankin Essex PTA 09/24/24, 1:08 PM

## 2024-09-24 NOTE — ED Provider Notes (Addendum)
 TOC team advised this patient should be able to be placed to a care facility today   Mallory Anes, MD 09/24/24 1102    ----------------------------------------- 3:52 PM on 09/24/2024 ----------------------------------------- Patient alert resting comfortably.  Patient agreeable with plan to discharge to [facility].  No distress.     Mallory Anes, MD 09/24/24 [phone]

## 2024-09-24 NOTE — ED Notes (Signed)
 Gave pt her cell phone and charger back. Phone at 100%

## 2024-09-24 NOTE — TOC Transition Note (Signed)
 Transition of Care [facility]) - Discharge Note   Patient Details  Name: Mallory Rodriguez MRN: [id number] Date of Birth: [birth year]  Transition of Care [facility]) CM/SW Contact:  [place] L Theodore Virgin, LCSW Phone Number: 09/24/2024, 12:03 PM   Clinical Narrative:     AVS documented. Patient is ready for discharge to [facility]. [facility], confirmed that patient will be going to room 106. Call to report will be made by Nurse Secretary. Nurse Secretary will also schedule transport with [facility]. Patients daughter has been notified.   TOC signing OFF.   Final next level of care: Home/Self Care Barriers to Discharge: Continued Medical Work up   Patient Goals and CMS Choice Patient states their goals for this hospitalization and ongoing recovery are:: rehab and then return home CMS Medicare.gov Compare Post Acute Care list provided to::  (Pt daughter provided preferences) Choice offered to / list presented to :  (pt daughter provided preferences)      Discharge Placement                       Discharge Plan and Services Additional resources added to the After Visit Summary for   In-house Referral: Clinical Social Work   Post Acute Care Choice: Skilled Nursing Facility                               Social Drivers of Health (SDOH) Interventions SDOH Screenings   Food Insecurity: No Food Insecurity (08/07/2024)  Housing: Unknown (08/07/2024)  Transportation Needs: No Transportation Needs (08/07/2024)  Utilities: Not At Risk (08/07/2024)  Alcohol  Screen: Low Risk  (08/07/2024)  Depression (PHQ2-9): Low Risk  (08/24/2024)  Financial Resource Strain: Low Risk  (08/07/2024)  Physical Activity: Inactive (08/07/2024)  Social Connections: Socially Isolated (08/07/2024)  Stress: No Stress Concern Present (08/07/2024)  Tobacco Use: Low Risk  (09/22/2024)  Health Literacy: Inadequate Health Literacy (08/07/2024)     Readmission Risk Interventions     No data to display

## 2024-09-24 NOTE — ED Notes (Signed)
 Occ health at bedside for assessment

## 2024-09-24 NOTE — Evaluation (Signed)
 Occupational Therapy Evaluation Patient Details Name: Mallory Rodriguez MRN: [id number] DOB: 11-07-1933 Today's Date: 09/24/2024   History of Present Illness   Pt admitted for fall on R side and inability to ambulate. Imaging negative at this time.     Clinical Impressions Mallory Rodriguez was seen for OT evaluation this date. Prior to hospital admission, pt was generally independent/MOD I for ADL management. Pt uses a 4WW for functional mobility and family assists her with IADL management including driving. Pt lives her spouse and adult children in a multi-level home; she is able to live on the main entrance. Pt presents with deficits in strength, pain management, activity tolerance, and balance affecting safe and optimal ADL completion. Pt currently requires MOD-MAX A for LB ADL management from STS, MOD A for bed mobility, and SET UP/SUP for seated UB ADL management at EOB. Pt would benefit from skilled OT services to address noted impairments and functional limitations (see below for any additional details) in order to maximize safety and independence while minimizing future risk of falls, injury, and readmission. Anticipate the need for follow up OT services upon acute hospital DC.      If plan is discharge home, recommend the following:   A lot of help with bathing/dressing/bathroom;A lot of help with walking and/or transfers;Assistance with cooking/housework;Help with stairs or ramp for entrance;Assist for transportation     Functional Status Assessment   Patient has had a recent decline in their functional status and demonstrates the ability to make significant improvements in function in a reasonable and predictable amount of time.     Equipment Recommendations   Other (comment) (defer to next venue of care)     Recommendations for Other Services         Precautions/Restrictions   Precautions Precautions: Fall Recall of Precautions/Restrictions: Intact Restrictions Weight  Bearing Restrictions Per Provider Order: No     Mobility Bed Mobility Overal bed mobility: Needs Assistance Bed Mobility: Supine to Sit, Sit to Supine     Supine to sit: Min assist, HOB elevated Sit to supine: Mod assist, HOB elevated        Transfers Overall transfer level: Needs assistance Equipment used: Rolling walker (2 wheels) Transfers: Sit to/from Stand Sit to Stand: Mod assist                  Balance Overall balance assessment: History of Falls, Needs assistance Sitting-balance support: Feet supported Sitting balance-Leahy Scale: Fair     Standing balance support: Bilateral upper extremity supported Standing balance-Leahy Scale: Poor                             ADL either performed or assessed with clinical judgement   ADL Overall ADL's : Needs assistance/impaired                                       General ADL Comments: MIN A for bed mobility in gurney bed. Pt able to sit EOB to perform UB grooming (face washing), and maintains static sitting balance well despite lack of foot support in gurney bed. Attempts STS from EOB able to take x1 small side step but unable to weight bear through RLE 2/2 pain. Able to shuffle step to L to re-position toward [facility]. MOD A to return to supine. Anticipate MOD/MAX A for LB ADL managemen from STS.  Vision Baseline Vision/History: 1 Wears glasses Ability to See in Adequate Light: 1 Impaired Patient Visual Report: No change from baseline       Perception         Praxis         Pertinent Vitals/Pain Pain Assessment Pain Assessment: Faces Faces Pain Scale: Hurts little more Pain Location: R hip Pain Descriptors / Indicators: Aching, Discomfort, Sore Pain Intervention(s): Limited activity within patient's tolerance, Monitored during session, Repositioned     Extremity/Trunk Assessment Upper Extremity Assessment Upper Extremity Assessment: Generalized weakness (pain limited on  R, but no focal deficits appreciated. grossly 3/5 t/o.)   Lower Extremity Assessment Lower Extremity Assessment: Generalized weakness       Communication Communication Communication: No apparent difficulties   Cognition Arousal: Alert Behavior During Therapy: WFL for tasks assessed/performed                                 Following commands: Intact       Cueing  General Comments   Cueing Techniques: Verbal cues      Exercises Other Exercises Other Exercises: Pt educated on role of OT in acute setting, bed mobility techniques, falls prevention strategies for home and hospital and dc recs.   Shoulder Instructions      Home Living Family/patient expects to be discharged to:: Private residence Living Arrangements: Spouse/significant other;Children Available Help at Discharge: Family;Available 24 hours/day Type of Home: House Home Access: Stairs to enter [employer] of Steps: 3 Entrance Stairs-Rails: Can reach both Home Layout: Able to live on main level with bedroom/bathroom     Bathroom Shower/Tub: Chief Strategy Officer: Standard     Home Equipment: Rollator (4 wheels);Cane - single point          Prior Functioning/Environment Prior Level of Function : Needs assist             Mobility Comments: was previously using rollator, no other falls in past 6 months ADLs Comments: Ind for bathing, dressing, etc. Pt states she tries to do as much as she can independently.    OT Problem List: Decreased strength;Decreased coordination;Pain;Decreased range of motion;Decreased activity tolerance;Impaired balance (sitting and/or standing);Decreased knowledge of use of DME or AE;Impaired UE functional use   OT Treatment/Interventions: Self-care/ADL training;Therapeutic exercise;Therapeutic activities;Energy conservation;Patient/family education;DME and/or AE instruction;Balance training      OT Goals(Current goals can be found in  the care plan section)   Acute Rehab OT Goals Patient Stated Goal: To feel better OT Goal Formulation: With patient Time For Goal Achievement: 10/08/24 Potential to Achieve Goals: Good ADL Goals Pt Will Perform Grooming: sitting;with modified independence Pt Will Perform Lower Body Dressing: sitting/lateral leans;sit to/from stand;with adaptive equipment;with supervision;with set-up Pt Will Transfer to Toilet: bedside commode;stand pivot transfer;with supervision;with set-up   OT Frequency:  Min 2X/week    Co-evaluation              AM-PAC OT 6 Clicks Daily Activity     Outcome Measure Help from another person eating meals?: A Little Help from another person taking care of personal grooming?: A Little Help from another person toileting, which includes using toliet, bedpan, or urinal?: A Lot Help from another person bathing (including washing, rinsing, drying)?: A Lot Help from another person to put on and taking off regular upper body clothing?: A Little Help from another person to put on and taking off regular lower body clothing?:  A Lot 6 Click Score: 15   End of Session Equipment Utilized During Treatment: Gait belt  Activity Tolerance: Patient tolerated treatment well Patient left: in bed;with call bell/phone within reach (In gurney bed in ED hallway)  OT Visit Diagnosis: Other abnormalities of gait and mobility (R26.89);Muscle weakness (generalized) (M62.81);Pain Pain - Right/Left: Right Pain - part of body: Hip;Shoulder                Time: 9168-9150 OT Time Calculation (min): 18 min Charges:  OT General Charges $OT Visit: 1 Visit OT Evaluation $OT Eval Low Complexity: 1 Low OT Treatments $Self Care/Home Management : 8-22 mins  Jhonny Pelton, M.S., OTR/L 09/24/24, 11:08 AM

## 2024-09-24 NOTE — ED Notes (Signed)
 [facility] called for transport to [facility] spoke with Mia she is 4th in the que for pick up

## 2024-09-24 NOTE — ED Provider Notes (Signed)
 Emergency Medicine Observation Re-evaluation Note  Mallory Rodriguez is a 89 y.o. female, seen on rounds today.  Pt initially presented to the ED for complaints of Fall and Hip Pain (/)  Currently, the patient is resting in bed. No reported issues from nursing team.   Physical Exam  BP 115/80 (BP Location: Left Arm)   Pulse 70   Temp 98.2 F (36.8 C) (Oral)   Resp 16   SpO2 100%  Physical Exam General: Resting in bed  ED Course / MDM   No labs last 24 hours.  Plan  Current plan is for dispo per social work.    Levander Slate, MD 09/24/24 [phone]

## 2024-09-25 ENCOUNTER — Encounter: Payer: Self-pay | Admitting: Nurse Practitioner

## 2024-09-25 ENCOUNTER — Non-Acute Institutional Stay (SKILLED_NURSING_FACILITY): Payer: Self-pay | Admitting: Nurse Practitioner

## 2024-09-25 DIAGNOSIS — I10 Essential (primary) hypertension: Secondary | ICD-10-CM | POA: Diagnosis not present

## 2024-09-25 DIAGNOSIS — E785 Hyperlipidemia, unspecified: Secondary | ICD-10-CM

## 2024-09-25 DIAGNOSIS — M1000 Idiopathic gout, unspecified site: Secondary | ICD-10-CM | POA: Diagnosis not present

## 2024-09-25 DIAGNOSIS — M25551 Pain in right hip: Secondary | ICD-10-CM | POA: Diagnosis not present

## 2024-09-25 DIAGNOSIS — D510 Vitamin B12 deficiency anemia due to intrinsic factor deficiency: Secondary | ICD-10-CM

## 2024-09-25 DIAGNOSIS — N1831 Chronic kidney disease, stage 3a: Secondary | ICD-10-CM | POA: Diagnosis not present

## 2024-09-25 NOTE — Progress Notes (Signed)
 Location:  Other [facility]) Nursing Home Room Number: 106-A Place of Service:  SNF [phone]) Provider: Harlene An, NP  Code Status: FULL CODE Goals of Care:     09/25/2024    9:19 AM  Advanced Directives  Does Patient Have a Medical Advance Directive? Yes  Type of Estate agent of [place];Living will  Does patient want to make changes to medical advance directive? No - Patient declined  Copy of Healthcare Power of Attorney in Chart? Yes - validated most recent copy scanned in chart (See row information)     Chief Complaint  Patient presents with   Hospitalization Follow-up    HPI: Patient is a 89 y.o. female seen today for ED follow up, new to [facility].  She has a hx of hypertension, hyperlipidemia, CVA, Hx ORIF right hip who went to the ED with right hip pain after a fall. Her and her husband live with her daughter and her husband.  Patient reports she was standing looking out the bathroom window when she turned around too fast and fell into the laundry basket and hit her right hip on the floor.  Reports she hit the mid-back portion of her head on the laundry basket.  Denies loss of consciousness, vision changes, headache, vomiting, dizziness, numbness or weakness in her arms or legs, chest pain, dyspnea, abdominal pain, neck pain.  Patient normally uses a walker at home to ambulate. she called EMS to get her off the floor and did not have ny pain at that time but days later started having pain.  Work up in the ED was negative for fracture. Chest xray, CT head and pelvix and femur xray normal She has a bruise on her hip that she feels but does not see any bruising.  She has no pain with sitting but increase in pain with mobility therefore she is here at [facility] for rehab. She reports she did PT this morning which went well.  She is taking tylenol  around the clock scheduled which has helped with pain.   Vit B12  injection- she takes at the beginning of the month.   had diarrhea during hospitalization but this has resolved.   No recent gout flares  Taking hydrochlorothiazide  25 mg daily for blood pressure which has been well controlled.   Past Medical History:  Diagnosis Date   AP (angina pectoris)    Basal cell carcinoma    back   Claustrophobia    CVA (cerebral vascular accident) (HCC)    Diverticulosis of colon    Hiatal hernia    HLD (hyperlipidemia)    HTN (hypertension)    Hypogammaglobulinemia    borderline   MVP (mitral valve prolapse)    and tricuspid leak   OA (osteoarthritis)    Pelvic kidney    lower left side   Pernicious anemia    Sciatica    TIA (transient ischemic attack) 08/2015    Past Surgical History:  Procedure Laterality Date   APPENDECTOMY     BASAL CELL CARCINOMA EXCISION     Back   BREAST BIOPSY Right    neg   BREAST LUMPECTOMY     Right (fatty tumor)   CATARACT EXTRACTION W/ INTRAOCULAR LENS  IMPLANT, BILATERAL Bilateral    CHOLECYSTECTOMY OPEN     DILATION AND CURETTAGE OF UTERUS     excision vestibular glands     Film removed Left eye     INGUINAL  HERNIA REPAIR     Left side   INTRAMEDULLARY (IM) NAIL INTERTROCHANTERIC Right 01/14/2017   Procedure: ORIF RIGHT INTERTROCH NAIL;  Surgeon: Donnice Car, MD;  Location: [facility] OR;  Service: Orthopedics;  Laterality: Right;   KNEE ARTHROSCOPY W/ PARTIAL MEDIAL MENISCECTOMY  08/2009   right   partial hip surgery     Right ovary and tube corpus     TONSILLECTOMY     TOTAL ABDOMINAL HYSTERECTOMY      Allergies  Allergen Reactions   Bee Venom Anaphylaxis   Contrast Media [Iodinated Contrast Media] Hives   Lipitor [Atorvastatin] Swelling   Ace Inhibitors Swelling   Hydrocodone  Other (See Comments)    Reaction:  Unknown    Macrodantin  Other (See Comments)    Reaction:  Chest pain    Neosporin [Neomycin-Bacitracin Zn-Polymyx] Other (See Comments)    Reaction:  Burning    Sulfa Antibiotics Hives    Epinephrine  Palpitations   Prednisone  Rash and Other (See Comments)    Reaction:  Dizziness and headache     Outpatient Encounter Medications as of 09/25/2024  Medication Sig   acetaminophen  (TYLENOL ) 650 MG CR tablet Take 650 mg by mouth 3 (three) times daily.   aspirin  EC 81 MG tablet Take 81 mg by mouth daily.   calcium  carbonate (OS-CAL) 600 MG TABS tablet Take 600 mg by mouth 2 (two) times daily.   Cholecalciferol (VITAMIN D ) 50 MCG (2000 UT) tablet Take 2,000 Units by mouth daily.   colchicine  0.6 MG tablet Take 2 tabs (1.2mg ) first dose. Then can take 1 tablet 0.6mg  daily thereafter until gout flare resolved.   cyanocobalamin  (VITAMIN B12) 1000 MCG/ML injection Inject 1,000 mcg into the muscle every 30 (thirty) days.   hydrochlorothiazide  (HYDRODIURIL ) 25 MG tablet TAKE 1 TABLET (25 MG TOTAL) BY MOUTH DAILY.   Multiple Vitamins-Minerals (CENTRUM SILVER  PO) Take by mouth.   Polyethyl Glycol-Propyl Glycol (SYSTANE OP) Apply 1 drop to eye as needed (for dry eyes).   polyethylene glycol (MIRALAX  / GLYCOLAX ) packet Take 17 g by mouth daily as needed for mild constipation.   predniSONE  (DELTASONE ) 5 MG tablet 20mg  for 3 days, then 15mg  for 3 days, then 10mg  for 3 days, then 5mg  for 3 days.   Don't take ibuprofen  or aleve .  Take with food.   Probiotic Product (ALIGN) 4 MG CAPS Take 1 capsule by mouth daily.   rosuvastatin  (CRESTOR ) 5 MG tablet TAKE 1 TABLET (5 MG TOTAL) BY MOUTH DAILY.   CALCIUM -VITAMIN D -VITAMIN K  PO Take 1 each by mouth daily. (Patient not taking: Reported on 09/25/2024)   EPINEPHrine  (EPIPEN  JR) 0.15 MG/0.3ML injection INJECT ONE SYRINGEFUL INTRAMUSCULARLY AS NEEDED FOR ANAPHYLAXIS   No facility-administered encounter medications on file as of 09/25/2024.    Review of Systems:  Review of Systems  Constitutional:  Negative for chills and fever.  HENT:  Negative for tinnitus.   Respiratory:  Negative for cough and shortness of breath.   Cardiovascular:  Negative for  chest pain, palpitations and leg swelling.  Gastrointestinal:  Negative for abdominal pain, constipation and diarrhea.  Genitourinary:  Negative for dysuria, frequency and urgency.  Musculoskeletal:  Negative for back pain and myalgias.  Skin: Negative.   Neurological:  Negative for dizziness and headaches.    Health Maintenance  Topic Date Due   Zoster Vaccines- Shingrix (1 of 2) 12/04/1952   Mammogram  10/19/2018   COVID-19 Vaccine (7 - Pfizer risk 2024-25 season) 08/31/2024   Medicare Annual Wellness (AWV)  08/07/2025  DTaP/Tdap/Td (4 - Td or Tdap) 01/16/2028   Pneumococcal Vaccine: 50+ Years  Completed   Influenza Vaccine  Completed   DEXA SCAN  Completed   HPV VACCINES  Aged Out   Meningococcal B Vaccine  Aged Out    Physical Exam: Vitals:   09/25/24 0822  BP: (!) 124/53  Pulse: 76  Resp: 18  Temp: (!) 97.1 F (36.2 C)  SpO2: 98%  Weight: 155 lb 8 oz (70.5 kg)  Height: 5' 1.5 (1.562 m)   Body mass index is 28.91 kg/m. Physical Exam Constitutional:      General: She is not in acute distress.    Appearance: She is well-developed. She is not diaphoretic.  HENT:     Head: Normocephalic and atraumatic.     Mouth/Throat:     Pharynx: No oropharyngeal exudate.  Eyes:     Conjunctiva/sclera: Conjunctivae normal.     Pupils: Pupils are equal, round, and reactive to light.  Cardiovascular:     Rate and Rhythm: Normal rate and regular rhythm.     Heart sounds: Normal heart sounds.  Pulmonary:     Effort: Pulmonary effort is normal.     Breath sounds: Normal breath sounds.  Abdominal:     General: Bowel sounds are normal.     Palpations: Abdomen is soft.  Musculoskeletal:     Cervical back: Normal range of motion and neck supple.     Right lower leg: No edema.     Left lower leg: No edema.  Skin:    General: Skin is warm and dry.  Neurological:     Mental Status: She is alert.     Motor: Weakness present.     Gait: Gait abnormal.  Psychiatric:        Mood  and Affect: Mood normal.     Labs reviewed: Basic Metabolic Panel: Recent Labs    08/24/24 1551  NA 144  K 4.1  CL 105  CO2 26  GLUCOSE 86  BUN 23  CREATININE 1.22*  CALCIUM  9.0  PHOS 3.8   Liver Function Tests: Recent Labs    08/24/24 1551  AST 17  ALT 11  ALKPHOS 48  BILITOT 0.3  PROT 7.0  ALBUMIN 4.4  4.4   No results for input(s): LIPASE, AMYLASE in the last 8760 hours. No results for input(s): AMMONIA in the last 8760 hours. CBC: Recent Labs    08/24/24 1551  WBC 6.8  HGB 12.0  HCT 36.2  MCV 91.5  PLT 231.0   Lipid Panel: Recent Labs    08/24/24 1551  CHOL 204*  HDL 84.30  LDLCALC 99  TRIG 106.0  CHOLHDL 2   No results found for: HGBA1C  Procedures since last visit: CT Head Wo Contrast Result Date: 09/22/2024 CLINICAL DATA:  Head trauma, minor (Age >= 65y) EXAM: CT HEAD WITHOUT CONTRAST TECHNIQUE: Contiguous axial images were obtained from the base of the skull through the vertex without intravenous contrast. RADIATION DOSE REDUCTION: This exam was performed according to the departmental dose-optimization program which includes automated exposure control, adjustment of the mA and/or kV according to patient size and/or use of iterative reconstruction technique. COMPARISON:  June 16 22 FINDINGS: Brain: Proportional prominence of the ventricles and sulci, consistent with diffuse cerebral parenchymal volume loss. The ventricles otherwise maintained midline position without midline shift. Gray-white differentiation is preserved.Periventricular and subcortical white matter hypoattenuation, most consistent with changes of moderate chronic ischemic microvascular disease.No evidence of acute territorial infarction, extra-axial fluid collection,  hemorrhage, or mass lesion. Ex vacuo dilatation of the left lateral ventricle with dense, adjacent basal ganglia calcification, consistent with remote infarct. Senescent calcification also noted in the basal ganglia  bilaterally. The basilar cisterns are patent without downward herniation. The cerebellar hemispheres and vermis are well formed without mass lesion or focal attenuation abnormality. Vascular: No hyperdense vessel. Calcified atherosclerotic plaque within the cavernous/supraclinoid ICA and intradural vertebral arteries. Skull: Normal. Negative for fracture or focal lesion. Sinuses/Orbits: The paranasal sinuses and mastoids are clear.The globes appear intact. No retrobulbar hematoma. Other: None. IMPRESSION: No acute intracranial abnormality, specifically, no acute hemorrhage, territorial infarction, or intracranial mass. Electronically Signed   By: Rogelia Myers M.D.   On: 09/22/2024 15:42   DG Ribs Unilateral W/Chest Right Result Date: 09/22/2024 EXAM: AP VIEW XRAY OF THE RIGHT RIBS AND CHEST 09/22/2024 01:20:00 PM COMPARISON: None available. CLINICAL HISTORY: fall on right side. Fall 1 day ago FINDINGS: BONES: No displaced rib fracture identified. LUNGS AND PLEURA: No consolidation or pulmonary edema. No pleural effusion or pneumothorax. HEART AND MEDIASTINUM: mild cardiomegaly. Aortic atherosclerosis. SOFT TISSUES: Surgical clips in right upper quadrant. IMPRESSION: 1. No displaced rib fracture identified. 2. No acute cardiopulmonary abnormality. Electronically signed by: Rockey Kilts MD 09/22/2024 02:02 PM EDT RP Workstation: [id number]   DG Femur Min 2 Views Right Result Date: 09/22/2024 EXAM: 2 VIEW(S) XRAY OF THE RIGHT FEMUR 09/22/2024 01:20:00 PM COMPARISON: 01/21/2020 CLINICAL HISTORY: fall on right side. Fall 1 day ago FINDINGS: BONES AND JOINTS: Intramedullary nail fixation of the proximal right femur in place. Old lesser trochanter fracture. Degenerative changes of the right knee. No acute fracture. SOFT TISSUES: Atherosclerotic vascular calcifications. The soft tissues are unremarkable. IMPRESSION: 1. No acute fracture or dislocation. Electronically signed by: Rockey Kilts MD 09/22/2024 01:59 PM EDT  RP Workstation: [id number]   DG Pelvis 1-2 Views Result Date: 09/22/2024 EXAM: 1 or 2 VIEW(S) XRAY OF THE PELVIS 09/22/2024 01:20:00 PM COMPARISON: None available. CLINICAL HISTORY: [id number] Fall 190176. Fall 1 day ago FINDINGS: BONES AND JOINTS: Partially visualized intramedullary rod and gamma nail within the right femur. Heterotopic ossification along the lesser trochanter likely reflecting sequela of remote fracture. Degenerative changes in visualized lower lumbar spine. Osteopenia. No acute fracture. No joint dislocation. SOFT TISSUES: The soft tissues are unremarkable. IMPRESSION: 1. No acute findings. Electronically signed by: Rockey Kilts MD 09/22/2024 01:58 PM EDT RP Workstation: [id number]    Assessment/Plan 1. Right hip pain (Primary) - acute pain after fall, work up negative for fracture -complete prednisone  taper  -tylenol  effectively controlling pain at this time Continue PT/OT   2. Stage 3a chronic kidney disease (HCC) -Encourage proper hydration and to avoid NSAIDS (Aleve , Advil , Motrin , Ibuprofen )  -monitor bmp  3. Hyperlipidemia, unspecified hyperlipidemia type Cntinues on crestor  5 mg daily   4. Idiopathic gout, unspecified chronicity, unspecified site No recent flares   5. Primary hypertension Controlled on hydrochlorothiazide  Monitor bmp   6. Pernicious anemia Continues on b12 monthly injection    Lashon Hillier K. Caro BODILY [facility] [phone]

## 2024-10-02 ENCOUNTER — Encounter: Payer: Self-pay | Admitting: Internal Medicine

## 2024-10-02 ENCOUNTER — Non-Acute Institutional Stay (SKILLED_NURSING_FACILITY): Payer: Self-pay | Admitting: Internal Medicine

## 2024-10-02 DIAGNOSIS — I672 Cerebral atherosclerosis: Secondary | ICD-10-CM | POA: Diagnosis not present

## 2024-10-02 DIAGNOSIS — W19XXXD Unspecified fall, subsequent encounter: Secondary | ICD-10-CM | POA: Diagnosis not present

## 2024-10-02 DIAGNOSIS — N1831 Chronic kidney disease, stage 3a: Secondary | ICD-10-CM

## 2024-10-02 DIAGNOSIS — D510 Vitamin B12 deficiency anemia due to intrinsic factor deficiency: Secondary | ICD-10-CM | POA: Diagnosis not present

## 2024-10-02 DIAGNOSIS — Z8673 Personal history of transient ischemic attack (TIA), and cerebral infarction without residual deficits: Secondary | ICD-10-CM

## 2024-10-02 DIAGNOSIS — Y92009 Unspecified place in unspecified non-institutional (private) residence as the place of occurrence of the external cause: Secondary | ICD-10-CM | POA: Diagnosis not present

## 2024-10-02 DIAGNOSIS — I69959 Hemiplegia and hemiparesis following unspecified cerebrovascular disease affecting unspecified side: Secondary | ICD-10-CM

## 2024-10-02 NOTE — Assessment & Plan Note (Signed)
 There is subtle asymmetry on strength testing to opposition with the left upper and left lower extremity stronger than the right to slight degree.

## 2024-10-02 NOTE — Assessment & Plan Note (Signed)
 Faint bilateral carotid bruit suggested.  Carotid Dopplers deferred to PCP.

## 2024-10-02 NOTE — Patient Instructions (Signed)
 See assessment and plan under each diagnosis in the problem list and acutely for this visit

## 2024-10-02 NOTE — Assessment & Plan Note (Signed)
 Current creatinine 1.22 with a GFR of 39.56 indicating CKD stage IIIb.  Med list reviewed; no indication for change in meds or dosage at this time.  Continue to monitor.

## 2024-10-02 NOTE — Assessment & Plan Note (Addendum)
 08/24/2024 CBC and B12 level normal.  Annual monitor should suffice in the absence of any suspected bleeding dyscrasias.

## 2024-10-02 NOTE — Progress Notes (Addendum)
 NURSING HOME LOCATION: [facility] [place] CODE STATUS: Full code  PCP: Arlyss RAMAN. Cleatus, MD  This is a comprehensive admission note to this SNFperformed on this date less than 30 days from date of admission. Included are preadmission medical/surgical history; reconciled medication list; family history; social history and comprehensive review of systems.  Corrections and additions to the records were documented. Comprehensive physical exam was also performed. Additionally a clinical summary was entered for each active diagnosis pertinent to this admission in the Problem List to enhance continuity of care.  HPI: The patient was evaluated in the ED 9/23 - 09/24/2024 with complaints of right hip pain following a fall.  This is in the context of prior history of ORIF right hip in 2018.  Apparently she was looking out of window and fell when she turned rapidly.  She struck the right hip as well as mid-back portion of the head on the laundry basket.  There was no loss of consciousness.  There were no definite neuro or cardiac prodrome symptoms or signs immediately prior.  The patient & her husband live with her daughter.  She typically ambulates with a walker.  She has had 1 fall in the past year.  Gait is described as unsteady at times.  Exam by the ED doctor revealed she was unable to bear weight on her leg without significant pain. Routine imaging revealed no acute changes or fractures.  CT scan of the hip was recommended for better assessment , but the daughter and patient declined. Head CT did reveal proportional prominence of the ventricles and sulci, consistent with diffuse cerebral parenchymal volume loss.  Periventricular and subcortical white matter hypoattenuation is most consistent with changes of moderate chronic ischemic microvascular disease.  There is no evidence of acute territorial infarction but ex-vacuo dilation of the left lateral ventricle with dense, adjacent basal ganglia  calcification, is consistent with remote infarct.  Senescent calcification was also noted in the basal ganglia bilaterally.  Calcified atherosclerotic plaque was present within the cavernous/supraclinoid ICA and intradural vertebral arteries. No labs were performed while she was in the ED.  The most recent labs were performed 08/24/2024 and revealed a creatinine 1.22 with a GFR of 39.56, compatible with CKD stage IIIb.  Albumin and total protein were normal with values of 4.4 and 7.0 respectively.  CBC revealed no anemia and platelet count was normal. PT consulted and recommended SNF placement for rehab.  Past medical and surgical history: Includes essential hypertension; dyslipidemia; history of stroke with residual hemiplegia; history of pernicious anemia; history of basal cell carcinoma; history of claustrophobia; history of diverticulosis; MVP; and history of gout. Surgeries and procedures include excision of basal cell carcinoma; breast biopsy; breast lumpectomy; cholecystectomy; intertrochanteric IM nailing 01/14/2017; knee arthroscopy with partial medial meniscectomy; and TAH.  Family history: reviewed, non contributory due to advanced age.  Social history: Social alcohol  intake; non-smoker.She & her husband moved here from the [place] area.    Review of systems: She confirms that there was no definite neurologic or cardiac prodrome prior to the fall.  She stated when she fell her husband, who was in a wheelchair, came and dragged her back into the bedroom by her arm.  She has had residual discomfort in the right lower extremity since.  She says that the pain in the right hip is much better.  She does have some discomfort when she walks or sits in a certain way.  Constitutional: No fever, significant weight  change, fatigue  Eyes: No redness, discharge, pain, vision change ENT/mouth: No nasal congestion, purulent discharge, earache, change in hearing, sore throat  Cardiovascular: No chest  pain, palpitations, paroxysmal nocturnal dyspnea, claudication, edema  Respiratory: No cough, sputum production, hemoptysis, DOE, significant snoring, apnea Gastrointestinal: No heartburn, dysphagia, abdominal pain, nausea /vomiting, rectal bleeding, melena, change in bowels Genitourinary: No dysuria, hematuria, pyuria, incontinence, nocturia Dermatologic: No rash, pruritus, change in appearance of skin Neurologic: No dizziness, headache, syncope, seizures, numbness, tingling Psychiatric: No significant anxiety, depression, insomnia, anorexia Endocrine: No change in hair/skin/nails, excessive thirst, excessive hunger, excessive urination  Hematologic/lymphatic: No significant bruising, lymphadenopathy, abnormal bleeding Allergy/immunology: No itchy/watery eyes, significant sneezing, urticaria, angioedema  Physical exam: Her voice is somewhat high-pitched.  There is minimal asymmetry of the nasolabial folds.  There is misalignment of the teeth.  Heart rhythm is slightly irregular.  Both S1 and S2 are increased.  There is slight splitting of the second heart sound intermittently.  She has faint carotid bruits, greater on the left.  She has trace edema at the sock line.  The right dorsalis pedis pulse is stronger than the other pulses.  The left upper and left lower extremities are stronger to opposition than the right upper and right lower extremities.  There is fusiform enlargement of the R knee.  Interosseous wasting is suggested.  Pertinent or positive findings: General appearance: no acute distress, increased work of breathing is present.   Lymphatic: No lymphadenopathy about the head, neck, axilla. Eyes: No conjunctival inflammation or lid edema is present. There is no scleral icterus. Ears:  External ear exam shows no significant lesions or deformities.   Nose:  External nasal examination shows no deformity or inflammation. Nasal mucosa are pink and moist without lesions, exudates Neck:  No  thyromegaly, masses, tenderness noted.    Heart:  No gallop, murmur, click, rub.  Lungs: Chest clear to auscultation without wheezes, rhonchi, rales, rubs. Abdomen: Bowel sounds are normal.  Abdomen is soft and nontender with no organomegaly, hernias, masses. GU: Deferred  Extremities:  No cyanosis, clubbing. Neurologic exam:  Balance, Rhomberg, finger to nose testing could not be completed due to clinical state Skin: Warm & dry w/o tenting. No significant lesions or rash.  See clinical summary under each active problem in the Problem List with associated updated therapeutic plan:  Stage 3a chronic kidney disease (HCC) Current creatinine 1.22 with a GFR of 39.56 indicating CKD stage IIIb.  Med list reviewed; no indication for change in meds or dosage at this time.  Continue to monitor.  Pernicious anemia 08/24/2024 CBC and B12 level normal.  Annual monitor should suffice in the absence of any suspected bleeding dyscrasias.  Hemiplegia of nondominant side, late effect of cerebrovascular disease (HCC) There is subtle asymmetry on strength testing to opposition with the left upper and left lower extremity stronger than the right to slight degree.  History of completed stroke She is an excellent historian and states that she is an avid reader.  Clinically there was no significant neurocognitive deficit on my exam today. MMSE deferred to PCP.  Intracranial atherosclerosis Faint bilateral carotid bruit suggested.  Carotid Dopplers deferred to PCP.  Fall at home, subsequent encounter PT/OT as tolerated @ SNF. If R hip pain persists or progresses , CT should be performed because of prior IM nailing in 2018.

## 2024-10-02 NOTE — Assessment & Plan Note (Addendum)
 She is an excellent historian and states that she is an avid reader.  Clinically there was no significant neurocognitive deficit on my exam today. MMSE deferred to PCP.

## 2024-10-03 NOTE — Assessment & Plan Note (Signed)
 PT/OT as tolerated @ SNF. If R hip pain persists or progresses , CT should be performed because of prior IM nailing in 2018.

## 2024-10-06 ENCOUNTER — Ambulatory Visit: Admitting: Family Medicine

## 2024-10-06 ENCOUNTER — Encounter: Admitting: Family Medicine

## 2024-10-08 ENCOUNTER — Ambulatory Visit

## 2024-10-09 ENCOUNTER — Other Ambulatory Visit: Payer: Self-pay | Admitting: Family Medicine

## 2024-10-09 DIAGNOSIS — M109 Gout, unspecified: Secondary | ICD-10-CM

## 2024-10-19 ENCOUNTER — Encounter: Payer: Self-pay | Admitting: Orthopedic Surgery

## 2024-10-19 ENCOUNTER — Other Ambulatory Visit: Payer: Self-pay | Admitting: *Deleted

## 2024-10-19 ENCOUNTER — Non-Acute Institutional Stay (SKILLED_NURSING_FACILITY): Payer: Self-pay | Admitting: Orthopedic Surgery

## 2024-10-19 DIAGNOSIS — N1831 Chronic kidney disease, stage 3a: Secondary | ICD-10-CM | POA: Diagnosis not present

## 2024-10-19 DIAGNOSIS — M25551 Pain in right hip: Secondary | ICD-10-CM

## 2024-10-19 DIAGNOSIS — I10 Essential (primary) hypertension: Secondary | ICD-10-CM

## 2024-10-19 DIAGNOSIS — Z8673 Personal history of transient ischemic attack (TIA), and cerebral infarction without residual deficits: Secondary | ICD-10-CM

## 2024-10-19 DIAGNOSIS — D510 Vitamin B12 deficiency anemia due to intrinsic factor deficiency: Secondary | ICD-10-CM

## 2024-10-19 DIAGNOSIS — E785 Hyperlipidemia, unspecified: Secondary | ICD-10-CM | POA: Diagnosis not present

## 2024-10-19 NOTE — Progress Notes (Signed)
 Location:  Other Nursing Home Room Number: 106/A Place of Service:  SNF (31) Provider:  Greig FORBES Cluster, NP   Cleatus Arlyss RAMAN, MD  Patient Care Team: Cleatus Arlyss RAMAN, MD as PCP - General (Family Medicine) Perla Evalene PARAS, MD as Consulting Physician (Cardiology) Lenn Standing, MD (Ophthalmology)  Extended Emergency Contact Information Primary Emergency Contact: Schwartz,Lynisha Address: [address]          [place], [place] [address] [place] Phone: [phone] Relation: Daughter Secondary Emergency Contact: Freshour,Howard Address: [address]          [place], [place] [address] [place] Home Phone: [phone] Relation: Spouse  Code Status:  Full code Goals of care: Advanced Directive information    09/25/2024    9:19 AM  Advanced Directives  Does Patient Have a Medical Advance Directive? Yes  Type of Estate agent of [place];Living will  Does patient want to make changes to medical advance directive? No - Patient declined  Copy of Healthcare Power of Attorney in Chart? Yes - validated most recent copy scanned in chart (See row information)     Chief Complaint  Patient presents with   Discharge Note    HPI:  Pt is a 89 y.o. female seen today for discharge evaluation.   She currently resides on the rehab unit at [facility]. PMH: HTN, HLD, mitral valve disorder, hemiplegia, OA, CKD, pernicious anemia, h/o basal cell carcinoma and gout.   Right hip pain- fall 09/23 with right hip pain, right hip/pelvis xrays negative for fracture or dislocation, bruising improved, denies pain, no recent falls, ambulates with walker, remains on tylenol  prn for pain H/o CVA- hemiplegia noted 2016> improved, remains on asa and statin HTN - BUN/creat 23/1.22 08/24/2024, remains on HCTZ HLD- total 204, LDL 99 08/24/2024, remains on rosuvastatin  CKD- GFR 39 ( 08/25)> was 46( 05/2023) Anemia - no recent bleeding, recent hgb  12.0 and B12 427 08/24/2024  She plans to discharge home today. Daughter to help at home. No health concerns today. No recent falls. Ambulates with rolator. HHPT/OT ordered. Denies right hip pain. Discussed falls precautions. Appointment made to f/u with PCP in 2 weeks.     Past Medical History:  Diagnosis Date   AP (angina pectoris)    Basal cell carcinoma    back   Claustrophobia    CVA (cerebral vascular accident) (HCC)    Diverticulosis of colon    Hiatal hernia    HLD (hyperlipidemia)    HTN (hypertension)    Hypogammaglobulinemia    borderline   MVP (mitral valve prolapse)    and tricuspid leak   OA (osteoarthritis)    Pelvic kidney    lower left side   Pernicious anemia    Sciatica    TIA (transient ischemic attack) 08/2015   Past Surgical History:  Procedure Laterality Date   APPENDECTOMY     BASAL CELL CARCINOMA EXCISION     Back   BREAST BIOPSY Right    neg   BREAST LUMPECTOMY     Right (fatty tumor)   CATARACT EXTRACTION W/ INTRAOCULAR LENS  IMPLANT, BILATERAL Bilateral    CHOLECYSTECTOMY OPEN     DILATION AND CURETTAGE OF UTERUS     excision vestibular glands     Film removed Left eye     INGUINAL HERNIA REPAIR     Left side   INTRAMEDULLARY (IM) NAIL INTERTROCHANTERIC Right 01/14/2017   Procedure: ORIF RIGHT INTERTROCH NAIL;  Surgeon:  Donnice Car, MD;  Location: [facility];  Service: Orthopedics;  Laterality: Right;   KNEE ARTHROSCOPY W/ PARTIAL MEDIAL MENISCECTOMY  08/2009   right   partial hip surgery     Right ovary and tube corpus     TONSILLECTOMY     TOTAL ABDOMINAL HYSTERECTOMY      Allergies  Allergen Reactions   Bee Venom Anaphylaxis   Contrast Media [Iodinated Contrast Media] Hives   Lipitor [Atorvastatin] Swelling   Ace Inhibitors Swelling   Hydrocodone  Other (See Comments)    Reaction:  Unknown    Macrodantin  Other (See Comments)    Reaction:  Chest pain    Neosporin [Neomycin-Bacitracin Zn-Polymyx] Other (See Comments)    Reaction:   Burning    Sulfa Antibiotics Hives   Epinephrine  Palpitations   Prednisone  Rash and Other (See Comments)    Reaction:  Dizziness and headache     Outpatient Encounter Medications as of 10/19/2024  Medication Sig   acetaminophen  (TYLENOL ) 650 MG CR tablet Take 650 mg by mouth 3 (three) times daily.   aspirin  EC 81 MG tablet Take 81 mg by mouth daily.   calcium  carbonate (OS-CAL) 600 MG TABS tablet Take 600 mg by mouth 2 (two) times daily.   Cholecalciferol (VITAMIN D ) 50 MCG (2000 UT) tablet Take 2,000 Units by mouth daily.   colchicine  0.6 MG tablet TAKE 2 TABS (1.2MG ) FIRST DOSE. THEN CAN TAKE 1 TABLET 0.6MG  DAILY THEREAFTER UNTIL GOUT FLARE RESOLVED.   cyanocobalamin  (VITAMIN B12) 1000 MCG/ML injection Inject 1,000 mcg into the muscle every 30 (thirty) days.   EPINEPHrine  (EPIPEN  JR) 0.15 MG/0.3ML injection INJECT ONE SYRINGEFUL INTRAMUSCULARLY AS NEEDED FOR ANAPHYLAXIS (Patient not taking: Reported on 10/02/2024)   hydrochlorothiazide  (HYDRODIURIL ) 25 MG tablet TAKE 1 TABLET (25 MG TOTAL) BY MOUTH DAILY.   Multiple Vitamins-Minerals (CENTRUM SILVER  PO) Take by mouth.   Polyethyl Glycol-Propyl Glycol (SYSTANE OP) Apply 1 drop to eye as needed (for dry eyes).   polyethylene glycol (MIRALAX  / GLYCOLAX ) packet Take 17 g by mouth daily as needed for mild constipation.   predniSONE  (DELTASONE ) 5 MG tablet 20mg  for 3 days, then 15mg  for 3 days, then 10mg  for 3 days, then 5mg  for 3 days.   Don't take ibuprofen  or aleve .  Take with food. (Patient not taking: Reported on 10/02/2024)   Probiotic Product (ALIGN) 4 MG CAPS Take 1 capsule by mouth daily.   rosuvastatin  (CRESTOR ) 5 MG tablet TAKE 1 TABLET (5 MG TOTAL) BY MOUTH DAILY.   No facility-administered encounter medications on file as of 10/19/2024.    Review of Systems  Constitutional:  Negative for fatigue and fever.  HENT:  Negative for sore throat and trouble swallowing.   Eyes:  Negative for visual disturbance.  Respiratory:  Negative  for cough and shortness of breath.   Cardiovascular:  Negative for chest pain and leg swelling.  Gastrointestinal:  Negative for abdominal distention and abdominal pain.  Genitourinary:  Negative for dysuria and hematuria.  Musculoskeletal:  Positive for arthralgias and gait problem.  Skin:  Negative for wound.  Neurological:  Positive for weakness. Negative for dizziness and headaches.  Psychiatric/Behavioral:  Negative for confusion, dysphoric mood and sleep disturbance. The patient is not nervous/anxious.     Immunization History  Administered Date(s) Administered    sv, Bivalent, Protein Subunit Rsvpref,pf (Abrysvo) 10/12/2023   Fluad Quad(high Dose 65+) 10/25/2020   INFLUENZA, HIGH DOSE SEASONAL PF 11/10/2019, 10/12/2023, 09/03/2024   Influenza Split 09/27/2011, 09/12/2012   Influenza Whole  10/04/2010   Influenza,inj,Quad PF,6+ Mos 10/08/2013, 09/15/2014, 08/31/2015, 09/18/2016, 10/18/2017   Influenza-Unspecified 09/08/2018, 09/23/2021, 09/29/2022   PFIZER Comirnaty(Gray Top)Covid-19 Tri-Sucrose Vaccine 09/29/2022   PFIZER(Purple Top)SARS-COV-2 Vaccination 02/03/2020, 02/24/2020, 09/27/2020   PNEUMOCOCCAL CONJUGATE-20 05/20/2023   Pfizer Covid-19 Vaccine Bivalent Booster 3yrs & up 09/23/2021   Pfizer(Comirnaty)Fall Seasonal Vaccine 12 years and older 10/12/2023   Pneumococcal Conjugate-13 11/30/2014   Pneumococcal Polysaccharide-23 12/31/2000, 09/05/2004   Td 01/01/2004, 01/07/2008   Tdap 01/15/2018   Zoster, Live 06/20/2011   Pertinent  Health Maintenance Due  Topic Date Due   Mammogram  10/19/2018   Influenza Vaccine  Completed   DEXA SCAN  Completed      05/20/2023    3:31 PM 05/20/2023    3:59 PM 08/07/2024    3:07 PM 08/24/2024    3:31 PM 09/18/2024    9:01 AM  Fall Risk  Falls in the past year? 0 0 0 0 0  Was there an injury with Fall? 0  0 0 0  Fall Risk Category Calculator 0  0 0 0  Patient at Risk for Falls Due to Impaired mobility;Orthopedic patient  No Fall  Risks No Fall Risks No Fall Risks  Fall risk Follow up Falls evaluation completed  Falls prevention discussed;Education provided Falls evaluation completed Falls evaluation completed   Functional Status Survey:    Vitals:   10/19/24 1209  BP: (!) 123/55  Pulse: 67  Resp: 14  Temp: (!) 97.3 F (36.3 C)  SpO2: 94%  Weight: 156 lb 3.2 oz (70.9 kg)  Height: 5' 1.5 (1.562 m)   Body mass index is 29.04 kg/m. Physical Exam Vitals reviewed.  Constitutional:      General: She is not in acute distress. HENT:     Head: Normocephalic.  Eyes:     General:        Right eye: No discharge.        Left eye: No discharge.  Cardiovascular:     Rate and Rhythm: Normal rate and regular rhythm.     Pulses: Normal pulses.     Heart sounds: Normal heart sounds.  Pulmonary:     Effort: Pulmonary effort is normal. No respiratory distress.     Breath sounds: Normal breath sounds. No wheezing or rales.  Abdominal:     General: Bowel sounds are normal. There is no distension.     Palpations: Abdomen is soft.     Tenderness: There is no abdominal tenderness.  Musculoskeletal:     Cervical back: Neck supple.     Right lower leg: No edema.     Left lower leg: No edema.     Comments: WBAT  Skin:    General: Skin is warm.     Capillary Refill: Capillary refill takes less than 2 seconds.  Neurological:     General: No focal deficit present.     Mental Status: She is alert. Mental status is at baseline.     Gait: Gait abnormal.     Comments: Hand grips equal  Psychiatric:        Mood and Affect: Mood normal.     Labs reviewed: Recent Labs    08/24/24 1551  NA 144  K 4.1  CL 105  CO2 26  GLUCOSE 86  BUN 23  CREATININE 1.22*  CALCIUM  9.0  PHOS 3.8   Recent Labs    08/24/24 1551  AST 17  ALT 11  ALKPHOS 48  BILITOT 0.3  PROT 7.0  ALBUMIN 4.4  4.4   Recent Labs    08/24/24 1551  WBC 6.8  HGB 12.0  HCT 36.2  MCV 91.5  PLT 231.0   Lab Results  Component Value Date    TSH 1.40 11/24/2013   No results found for: HGBA1C Lab Results  Component Value Date   CHOL 204 (H) 08/24/2024   HDL 84.30 08/24/2024   LDLCALC 99 08/24/2024   LDLDIRECT 105.0 11/24/2013   TRIG 106.0 08/24/2024   CHOLHDL 2 08/24/2024    Significant Diagnostic Results in last 30 days:  CT Head Wo Contrast Result Date: 09/22/2024 CLINICAL DATA:  Head trauma, minor (Age >= 65y) EXAM: CT HEAD WITHOUT CONTRAST TECHNIQUE: Contiguous axial images were obtained from the base of the skull through the vertex without intravenous contrast. RADIATION DOSE REDUCTION: This exam was performed according to the departmental dose-optimization program which includes automated exposure control, adjustment of the mA and/or kV according to patient size and/or use of iterative reconstruction technique. COMPARISON:  June 16 22 FINDINGS: Brain: Proportional prominence of the ventricles and sulci, consistent with diffuse cerebral parenchymal volume loss. The ventricles otherwise maintained midline position without midline shift. Gray-white differentiation is preserved.Periventricular and subcortical white matter hypoattenuation, most consistent with changes of moderate chronic ischemic microvascular disease.No evidence of acute territorial infarction, extra-axial fluid collection, hemorrhage, or mass lesion. Ex vacuo dilatation of the left lateral ventricle with dense, adjacent basal ganglia calcification, consistent with remote infarct. Senescent calcification also noted in the basal ganglia bilaterally. The basilar cisterns are patent without downward herniation. The cerebellar hemispheres and vermis are well formed without mass lesion or focal attenuation abnormality. Vascular: No hyperdense vessel. Calcified atherosclerotic plaque within the cavernous/supraclinoid ICA and intradural vertebral arteries. Skull: Normal. Negative for fracture or focal lesion. Sinuses/Orbits: The paranasal sinuses and mastoids are clear.The  globes appear intact. No retrobulbar hematoma. Other: None. IMPRESSION: No acute intracranial abnormality, specifically, no acute hemorrhage, territorial infarction, or intracranial mass. Electronically Signed   By: Rogelia Myers M.D.   On: 09/22/2024 15:42   DG Ribs Unilateral W/Chest Right Result Date: 09/22/2024 EXAM: AP VIEW XRAY OF THE RIGHT RIBS AND CHEST 09/22/2024 01:20:00 PM COMPARISON: None available. CLINICAL HISTORY: fall on right side. Fall 1 day ago FINDINGS: BONES: No displaced rib fracture identified. LUNGS AND PLEURA: No consolidation or pulmonary edema. No pleural effusion or pneumothorax. HEART AND MEDIASTINUM: mild cardiomegaly. Aortic atherosclerosis. SOFT TISSUES: Surgical clips in right upper quadrant. IMPRESSION: 1. No displaced rib fracture identified. 2. No acute cardiopulmonary abnormality. Electronically signed by: Rockey Kilts MD 09/22/2024 02:02 PM EDT RP Workstation: [id number]   DG Femur Min 2 Views Right Result Date: 09/22/2024 EXAM: 2 VIEW(S) XRAY OF THE RIGHT FEMUR 09/22/2024 01:20:00 PM COMPARISON: 01/21/2020 CLINICAL HISTORY: fall on right side. Fall 1 day ago FINDINGS: BONES AND JOINTS: Intramedullary nail fixation of the proximal right femur in place. Old lesser trochanter fracture. Degenerative changes of the right knee. No acute fracture. SOFT TISSUES: Atherosclerotic vascular calcifications. The soft tissues are unremarkable. IMPRESSION: 1. No acute fracture or dislocation. Electronically signed by: Rockey Kilts MD 09/22/2024 01:59 PM EDT RP Workstation: [id number]   DG Pelvis 1-2 Views Result Date: 09/22/2024 EXAM: 1 or 2 VIEW(S) XRAY OF THE PELVIS 09/22/2024 01:20:00 PM COMPARISON: None available. CLINICAL HISTORY: 809823 Fall 190176. Fall 1 day ago FINDINGS: BONES AND JOINTS: Partially visualized intramedullary rod and gamma nail within the right femur. Heterotopic ossification along the lesser trochanter likely reflecting sequela of remote fracture. Degenerative  changes in visualized lower lumbar  spine. Osteopenia. No acute fracture. No joint dislocation. SOFT TISSUES: The soft tissues are unremarkable. IMPRESSION: 1. No acute findings. Electronically signed by: Rockey Kilts MD 09/22/2024 01:58 PM EDT RP Workstation: [id number]    Assessment/Plan 1. Right hip pain (Primary) - 09/23 fall with right hip bruising> ED xrays negative for fracture/dislocation - improved ambulation with walker - cont tylenol  prn for pain - cont HHPT/OT  2. History of completed stroke - noted 2016 - no apparent hemiplegia on exam - cont asa and statin  3. Primary hypertension - controlled with HCTZ  4. Hyperlipidemia, unspecified hyperlipidemia type - LDL stable with rosuvastatin   5. Stage 3a chronic kidney disease (HCC) - encourage hydration with water - avoid NSAIDS  6. Pernicious anemia - recent hgb stable    Family/ staff Communication: plan discussed with patient and nurse  Labs/tests ordered:  f/u with PCP in 2 weeks

## 2024-10-19 NOTE — Patient Outreach (Signed)
 Per [facility], Mallory Rodriguez resides in [place].  [facility] SNF waiver utilized for admission to [facility].  Secure message sent to [facility] Admissions Director, to request collaboration and an update regarding transition plans and barriers to discharge.  [name], MSN, RN, BSN [place]  [facility], [facility] RN Post- Acute Care Manager Direct Dial: [phone]

## 2024-10-20 ENCOUNTER — Telehealth: Payer: Self-pay

## 2024-10-20 ENCOUNTER — Inpatient Hospital Stay: Admitting: Family Medicine

## 2024-10-20 ENCOUNTER — Other Ambulatory Visit: Payer: Self-pay | Admitting: *Deleted

## 2024-10-20 NOTE — Patient Outreach (Signed)
 Verified in [facility], Mallory Rodriguez discharged from [name] on 10/19/24. [facility] SNF waiver was previously utilized for admission to [name].  Previous update received from [name] Admissions Director. Mallory Rodriguez returned home with [name].  No identifiable complex care management needs.  [name], MSN, RN, BSN [place]  [facility], [facility] RN Post- Acute Care Manager Direct Dial: [phone]

## 2024-10-20 NOTE — Transitions of Care (Post Inpatient/ED Visit) (Signed)
 10/20/2024  Name: Mallory Rodriguez MRN: [id number] DOB: [birth year]  Today's TOC FU Call Status: Today's TOC FU Call Status:: Successful TOC FU Call Completed TOC FU Call Complete Date: 10/20/24 Patient's Name and Date of Birth confirmed.  Transition Care Management Follow-up Telephone Call Date of Discharge: 10/19/24 Discharge Facility: Other ([facility]) Name of [facility] ([facility]) [facility]charge Facility: [facility] Type of Discharge: Inpatient Admission Primary Inpatient Discharge Diagnosis:: fall How have you been since you were released from the hospital?: Better Any questions or concerns?: No  Items Reviewed: Did you receive and understand the discharge instructions provided?: Yes Medications obtained,verified, and reconciled?: Yes (Medications Reviewed) Any new allergies since your discharge?: No Dietary orders reviewed?: Yes Do you have support at home?: Yes People in Home [RPT]: child(ren), adult, spouse  Medications Reviewed Today: Medications Reviewed Today     Reviewed by Emmitt Pan, LPN (Licensed Practical Nurse) on 10/20/24 at 1035  Med List Status: <None>   Medication Order Taking? Sig Documenting Provider Last Dose Status Informant  acetaminophen  (TYLENOL ) 650 MG CR tablet [id number] Yes Take 650 mg by mouth 3 (three) times daily. [provider]  Active Self, Pharmacy Records  aspirin  EC 81 MG tablet [id number] Yes Take 81 mg by mouth daily. [provider]  Active Self, Pharmacy Records  calcium  carbonate (OS-CAL) 600 MG TABS tablet [id number] Yes Take 600 mg by mouth 2 (two) times daily. [provider]  Active   Cholecalciferol (VITAMIN D ) 50 MCG (2000 UT) tablet [id number] Yes Take 2,000 Units by mouth daily. [provider]  Active   colchicine  0.6 MG tablet [id number] Yes TAKE 2 TABS (1.2MG ) FIRST DOSE. THEN CAN TAKE 1 TABLET 0.6MG  DAILY THEREAFTER UNTIL GOUT FLARE RESOLVED. Cleatus Arlyss RAMAN, MD  Active   cyanocobalamin   (VITAMIN B12) 1000 MCG/ML injection [id number] Yes Inject 1,000 mcg into the muscle every 30 (thirty) days. [provider]  Active Self, Pharmacy Records  EPINEPHrine  (EPIPEN  JR) 0.15 MG/0.3ML injection [id number] Yes INJECT ONE SYRINGEFUL INTRAMUSCULARLY AS NEEDED FOR ANAPHYLAXIS Jimmy Charlie FERNS, MD  Active Self, Pharmacy Records  hydrochlorothiazide  (HYDRODIURIL ) 25 MG tablet [id number] Yes TAKE 1 TABLET (25 MG TOTAL) BY MOUTH DAILY. Jimmy Charlie FERNS, MD  Active Self, Pharmacy Records  Multiple Vitamins-Minerals (CENTRUM SILVER  PO) [id number] Yes Take by mouth. [provider]  Active Self, Pharmacy Records  Polyethyl Glycol-Propyl Glycol (SYSTANE OP) [id number] Yes Apply 1 drop to eye as needed (for dry eyes). [provider]  Active Self, Pharmacy Records  polyethylene glycol (MIRALAX  / GLYCOLAX ) packet [id number] Yes Take 17 g by mouth daily as needed for mild constipation. Ernie Cough, MD  Active Self, Pharmacy Records  Probiotic Product (ALIGN) 4 MG CAPS [id number] Yes Take 1 capsule by mouth daily. [provider]  Active Self, Pharmacy Records  rosuvastatin  (CRESTOR ) 5 MG tablet [id number] Yes TAKE 1 TABLET (5 MG TOTAL) BY MOUTH DAILY. Jimmy Charlie FERNS, MD  Active Self, Pharmacy Records            Home Care and Equipment/Supplies: Were Home Health Services Ordered?: Yes Name of Home Health Agency:: [facility] Has Agency set up a time to come to your home?: Yes First Home Health Visit Date: 10/20/24 Any new equipment or medical supplies ordered?: Yes Name of Medical supply agency?: unknown Were you able to get the equipment/medical supplies?: Yes Do you have any questions related to the use of the equipment/supplies?: No  Functional Questionnaire: Do you need assistance with bathing/showering or dressing?: Yes Do  you need assistance with meal preparation?: Yes Do you need assistance with eating?: No Do you have difficulty maintaining  continence: No Do you need assistance with getting out of bed/getting out of a chair/moving?: No Do you have difficulty managing or taking your medications?: Yes  Follow up appointments reviewed: PCP Follow-up appointment confirmed?: Yes Date of PCP follow-up appointment?: 10/29/24 Follow-up Provider: [facility] Follow-up appointment confirmed?: NA Do you need transportation to your follow-up appointment?: No Do you understand care options if your condition(s) worsen?: Yes-patient verbalized understanding    SIGNATURE Julian Lemmings, LPN [facility] Nurse Health Advisor Direct Dial [phone]

## 2024-10-20 NOTE — Telephone Encounter (Signed)
 Noted. Thanks.

## 2024-10-29 ENCOUNTER — Ambulatory Visit: Admitting: Family Medicine

## 2024-11-04 DIAGNOSIS — I700 Atherosclerosis of aorta: Secondary | ICD-10-CM | POA: Diagnosis not present

## 2024-11-04 DIAGNOSIS — I69354 Hemiplegia and hemiparesis following cerebral infarction affecting left non-dominant side: Secondary | ICD-10-CM | POA: Diagnosis not present

## 2024-11-04 DIAGNOSIS — M5430 Sciatica, unspecified side: Secondary | ICD-10-CM | POA: Diagnosis not present

## 2024-11-04 DIAGNOSIS — D510 Vitamin B12 deficiency anemia due to intrinsic factor deficiency: Secondary | ICD-10-CM | POA: Diagnosis not present

## 2024-11-04 DIAGNOSIS — E785 Hyperlipidemia, unspecified: Secondary | ICD-10-CM | POA: Diagnosis not present

## 2024-11-04 DIAGNOSIS — N1832 Chronic kidney disease, stage 3b: Secondary | ICD-10-CM | POA: Diagnosis not present

## 2024-11-04 DIAGNOSIS — F40240 Claustrophobia: Secondary | ICD-10-CM | POA: Diagnosis not present

## 2024-11-04 DIAGNOSIS — M1990 Unspecified osteoarthritis, unspecified site: Secondary | ICD-10-CM | POA: Diagnosis not present

## 2024-11-04 DIAGNOSIS — K5730 Diverticulosis of large intestine without perforation or abscess without bleeding: Secondary | ICD-10-CM | POA: Diagnosis not present

## 2024-11-04 DIAGNOSIS — M109 Gout, unspecified: Secondary | ICD-10-CM | POA: Diagnosis not present

## 2024-11-04 DIAGNOSIS — I341 Nonrheumatic mitral (valve) prolapse: Secondary | ICD-10-CM | POA: Diagnosis not present

## 2024-11-04 DIAGNOSIS — I129 Hypertensive chronic kidney disease with stage 1 through stage 4 chronic kidney disease, or unspecified chronic kidney disease: Secondary | ICD-10-CM | POA: Diagnosis not present

## 2024-11-12 ENCOUNTER — Ambulatory Visit (INDEPENDENT_AMBULATORY_CARE_PROVIDER_SITE_OTHER)

## 2024-11-12 DIAGNOSIS — D510 Vitamin B12 deficiency anemia due to intrinsic factor deficiency: Secondary | ICD-10-CM

## 2024-11-12 MED ORDER — CYANOCOBALAMIN 1000 MCG/ML IJ SOLN
1000.0000 ug | Freq: Once | INTRAMUSCULAR | Status: AC
  Administered 2024-11-12: 1000 ug via INTRAMUSCULAR

## 2024-11-12 NOTE — Progress Notes (Signed)
 Per orders of Dr. Crawford Givens, injection of vitamin b 12 given by Lewanda Rife in right deltoid. Patient tolerated injection well. Patient will make appointment for 1 month.

## 2024-11-13 ENCOUNTER — Ambulatory Visit: Admitting: Family Medicine

## 2024-11-21 ENCOUNTER — Encounter: Payer: Self-pay | Admitting: Family Medicine

## 2024-11-21 DIAGNOSIS — I10 Essential (primary) hypertension: Secondary | ICD-10-CM

## 2024-11-21 DIAGNOSIS — E785 Hyperlipidemia, unspecified: Secondary | ICD-10-CM

## 2024-11-23 MED ORDER — ROSUVASTATIN CALCIUM 5 MG PO TABS: 5.0000 mg | ORAL_TABLET | Freq: Every day | ORAL | 2 refills | Status: AC

## 2024-11-23 MED ORDER — HYDROCHLOROTHIAZIDE 25 MG PO TABS: 25.0000 mg | ORAL_TABLET | Freq: Every day | ORAL | 2 refills | Status: AC

## 2024-11-23 NOTE — Telephone Encounter (Signed)
 E-scribed refills to [facility]

## 2024-12-15 ENCOUNTER — Ambulatory Visit

## 2024-12-28 ENCOUNTER — Encounter: Payer: Self-pay | Admitting: Family Medicine

## 2024-12-28 NOTE — Telephone Encounter (Signed)
Please advise. Ok to refill? °

## 2025-01-03 MED ORDER — CYANOCOBALAMIN 1000 MCG/ML IJ SOLN: 1000.0000 ug | INTRAMUSCULAR | 5 refills | Status: AC

## 2025-01-19 ENCOUNTER — Ambulatory Visit

## 2025-08-10 ENCOUNTER — Ambulatory Visit
